# Patient Record
Sex: Male | Born: 1960 | Race: White | Hispanic: No | Marital: Single | Smoking: Never smoker
Health system: Southern US, Community
[De-identification: ages and names within clinical notes are randomized; demographics above are authoritative.]

## PROBLEM LIST (undated history)

## (undated) DIAGNOSIS — R1310 Dysphagia, unspecified: Secondary | ICD-10-CM

## (undated) DIAGNOSIS — G9340 Encephalopathy, unspecified: Secondary | ICD-10-CM

## (undated) DIAGNOSIS — E119 Type 2 diabetes mellitus without complications: Secondary | ICD-10-CM

## (undated) DIAGNOSIS — R569 Unspecified convulsions: Secondary | ICD-10-CM

## (undated) DIAGNOSIS — F329 Major depressive disorder, single episode, unspecified: Secondary | ICD-10-CM

## (undated) DIAGNOSIS — F419 Anxiety disorder, unspecified: Secondary | ICD-10-CM

---

## 2015-07-10 ENCOUNTER — Emergency Department
Admission: EM | Admit: 2015-07-10 | Discharge: 2015-07-10 | Disposition: A | Discharge status: Home / Self Care | Payer: Medicare Other | Attending: Emergency Medicine | Admitting: Emergency Medicine

## 2015-07-10 ENCOUNTER — Emergency Department: Payer: Medicare Other

## 2015-07-10 DIAGNOSIS — F329 Major depressive disorder, single episode, unspecified: Secondary | ICD-10-CM | POA: Diagnosis not present

## 2015-07-10 DIAGNOSIS — E119 Type 2 diabetes mellitus without complications: Secondary | ICD-10-CM | POA: Insufficient documentation

## 2015-07-10 DIAGNOSIS — R569 Unspecified convulsions: Secondary | ICD-10-CM

## 2015-07-10 DIAGNOSIS — R4182 Altered mental status, unspecified: Secondary | ICD-10-CM | POA: Diagnosis not present

## 2015-07-10 HISTORY — DX: Major depressive disorder, single episode, unspecified: F32.9

## 2015-07-10 HISTORY — DX: Anxiety disorder, unspecified: F41.9

## 2015-07-10 HISTORY — DX: Encephalopathy, unspecified: G93.40

## 2015-07-10 HISTORY — DX: Unspecified convulsions: R56.9

## 2015-07-10 HISTORY — DX: Type 2 diabetes mellitus without complications: E11.9

## 2015-07-10 LAB — CBC WITH DIFFERENTIAL/PLATELET
BASOS ABS: 0.1 10*3/uL (ref 0–0.1)
BASOS PCT: 1 %
EOS ABS: 0.2 10*3/uL (ref 0–0.7)
EOS PCT: 4 %
HCT: 39.2 % — ABNORMAL LOW (ref 40.0–52.0)
HEMOGLOBIN: 12.9 g/dL — AB (ref 13.0–18.0)
LYMPHS ABS: 1.4 10*3/uL (ref 1.0–3.6)
Lymphocytes Relative: 21 %
MCH: 26.5 pg (ref 26.0–34.0)
MCHC: 32.8 g/dL (ref 32.0–36.0)
MCV: 80.7 fL (ref 80.0–100.0)
Monocytes Absolute: 0.5 10*3/uL (ref 0.2–1.0)
Monocytes Relative: 7 %
NEUTROS PCT: 67 %
Neutro Abs: 4.7 10*3/uL (ref 1.4–6.5)
PLATELETS: 248 10*3/uL (ref 150–440)
RBC: 4.85 MIL/uL (ref 4.40–5.90)
RDW: 14.1 % (ref 11.5–14.5)
WBC: 6.9 10*3/uL (ref 3.8–10.6)

## 2015-07-10 LAB — COMPREHENSIVE METABOLIC PANEL
ALBUMIN: 3.9 g/dL (ref 3.5–5.0)
ALT: 23 U/L (ref 17–63)
AST: 27 U/L (ref 15–41)
Alkaline Phosphatase: 141 U/L — ABNORMAL HIGH (ref 38–126)
Anion gap: 11 (ref 5–15)
BILIRUBIN TOTAL: 0.4 mg/dL (ref 0.3–1.2)
BUN: 11 mg/dL (ref 6–20)
CHLORIDE: 101 mmol/L (ref 101–111)
CO2: 26 mmol/L (ref 22–32)
CREATININE: 0.63 mg/dL (ref 0.61–1.24)
Calcium: 9.2 mg/dL (ref 8.9–10.3)
GFR calc Af Amer: >60 mL/min (ref >60)
GFR calc non Af Amer: >60 mL/min (ref >60)
GLUCOSE: 209 mg/dL — AB (ref 65–99)
POTASSIUM: 4.6 mmol/L (ref 3.5–5.1)
Sodium: 138 mmol/L (ref 135–145)
Total Protein: 8.1 g/dL (ref 6.5–8.1)

## 2015-07-10 LAB — LIPASE, BLOOD: LIPASE: 24 U/L (ref 11–51)

## 2015-07-10 LAB — URINALYSIS COMPLETE WITH MICROSCOPIC (ARMC ONLY)
BILIRUBIN URINE: NEGATIVE
Bacteria, UA: NONE SEEN
GLUCOSE, UA: 50 mg/dL — AB
KETONES UR: NEGATIVE mg/dL
LEUKOCYTES UA: NEGATIVE
NITRITE: NEGATIVE
PH: 5 (ref 5.0–8.0)
Protein, ur: 100 mg/dL — AB
RBC / HPF: NONE SEEN RBC/hpf (ref 0–5)
Specific Gravity, Urine: 1.021 (ref 1.005–1.030)

## 2015-07-10 LAB — TROPONIN I

## 2015-07-10 LAB — PHENYTOIN LEVEL, TOTAL: Phenytoin Lvl: 4.6 ug/mL — ABNORMAL LOW (ref 10.0–20.0)

## 2015-07-10 LAB — LACTIC ACID, PLASMA
LACTIC ACID, VENOUS: 4.4 mmol/L — AB (ref 0.5–2.0)
LACTIC ACID, VENOUS: 1.5 mmol/L (ref 0.5–2.0)

## 2015-07-10 MED ORDER — SODIUM CHLORIDE 0.9 % IV SOLN
500.0000 mg | Freq: Once | INTRAVENOUS | Status: AC
  Administered 2015-07-10: 500 mg via INTRAVENOUS
  Filled 2015-07-10: qty 5

## 2015-07-10 MED ORDER — LEVETIRACETAM 100 MG/ML PO SOLN: 500.0000 mg | Freq: Two times a day (BID) | ORAL | Status: DC

## 2015-07-10 MED ORDER — SODIUM CHLORIDE 0.9 % IV BOLUS (SEPSIS)
1000.0000 mL | Freq: Once | INTRAVENOUS | Status: AC
  Administered 2015-07-10: 1000 mL via INTRAVENOUS

## 2015-07-10 MED ORDER — PHENYTOIN 50 MG PO CHEW: 300.0000 mg | CHEWABLE_TABLET | Freq: Every day | ORAL | Status: AC

## 2015-07-10 MED ORDER — SODIUM CHLORIDE 0.9 % IV SOLN: Freq: Once | INTRAVENOUS | Status: DC

## 2015-07-10 MED ORDER — SODIUM CHLORIDE 0.9 % IV SOLN
15.0000 mg/kg | Freq: Once | INTRAVENOUS | Status: AC
  Administered 2015-07-10: 1332 mg via INTRAVENOUS
  Filled 2015-07-10: qty 26.64

## 2015-07-10 NOTE — ED Notes (Signed)
Pt came to ED via EMS from [facility]. Pt had unwitnessed fall. Per staff, pt normally only responds one word. Pt has history of brain tumor, DM,  Wheelchair bound.

## 2015-07-10 NOTE — ED Notes (Signed)
Lactic acid 4.4 MD notified.

## 2015-07-10 NOTE — ED Notes (Signed)
Pt transported to CT ?

## 2015-07-10 NOTE — ED Notes (Addendum)
MD informed of new lactic acid result.  Verbal orders to discontinue STAT lactic acid and 2L NS.

## 2015-07-10 NOTE — Discharge Instructions (Signed)
The patient has a history of seizures and we think he may have had a seizure. His mental status has improved markedly since he's been here. His labs are essentially normal. CT shows no apparent acute pathology although his sinuses are obscured. He is not running a fever or white count. Please monitor the patient closely tonight and tomorrow. Please have him return here if he is worse at all. This includes running a fever becoming more confused again or having other any other problems. These have his doctor check on him quickly as well.      We asked to increase his Dilantin to 300 mg every evening, and his Keppra to 500 mg twice a day. I'll closely with his doctor patient will need a Dilantin level rechecked this week.

## 2015-07-10 NOTE — ED Notes (Signed)
Pt discharged by EMS to [facility].  Report given to [place] at facility.

## 2015-07-10 NOTE — ED Provider Notes (Signed)
[facility] Emergency Department Provider Note   ____________________________________________  Time seen: Approximately 12:25 PM  I have reviewed the triage vital signs and the nursing notes.   HISTORY  Chief Complaint Fall  History limited by decreased mental status  HPI Cory Salazar is a 55 y.o. male EMS brings patient from [facility]. He was apparently found face down and EMS was told that he is at his baseline mental status and thought to have fallen forward out of his wheelchair. Patient has a DO NOT RESUSCITATE bracelet on his wrist. We call [place] [facility] and speak to his nurse who reports she is not at his baseline is usually more alert and awake and that is why he was sent here. Patient apparently has a history of brain tumor. Currently patient is awake or responding to questions with 1 or 2 word answer. He does not open his eyes completely. He does not follow commands very well at all. I am uncertain as to how long his been like this.   Past Medical History  Diagnosis Date  . Diabetes mellitus without complication (HCC)   . MDD (major depressive disorder) (HCC)   . Encephalopathy   . Anxiety disorder   . Seizure (HCC)     There are no active problems to display for this patient.   History reviewed. No pertinent past surgical history.  No current outpatient prescriptions on file.  Allergies Review of patient's allergies indicates no known allergies.  No family history on file.  Social History Social History  Substance Use Topics  . Smoking status: Unknown If Ever Smoked  . Smokeless tobacco: None  . Alcohol Use: None     Comment: unable to assess    Review of Systems Unobtainable  ____________________________________________   PHYSICAL EXAM:  VITAL SIGNS: ED Triage Vitals  Enc Vitals Group     BP --      Pulse --      Resp --      Temp --      Temp src --      SpO2 --      Weight --      Height --        Head Cir --      Peak Flow --      Pain Score --      Pain Loc --      Pain Edu? --      Excl. in GC? --     Constitutional: Awake responds to questions with one-word answers. His voice is low pitched and very quiet. He does not follow commands very well at all. Eyes: Conjunctivae are normal. PERRL. EOMI. Head: There is no abrasion on his forehead and dried blood around his nose. There is a couple superficial cuts on his the bridge of his nose. There is no nasal septal hematoma. There is no apparent bleeding in his mouth. Nose: No congestion/rhinnorhea. Mouth/Throat: Mucous membranes are moist.  Oropharynx non-erythematous. Neck: No stridor.  Neck does not appear to be tender but the patient appears somewhat groggy so I'm not completely sure. Cardiovascular: Normal rate, regular rhythm. Grossly normal heart sounds.  Good peripheral circulation. Respiratory: Normal respiratory effort.  No retractions. Lungs CTAB. No apparent chest pain. Gastrointestinal: Soft and nontender. No distention. No abdominal bruits. No CVA tenderness. Musculoskeletal: No lower extremity tenderness nor edema.  No joint effusions. Neurologic: Patient not moving any extremities very well but he moves them all equally.. Skin:  Skin is warm, dry and intact. No rash noted.   ____________________________________________   LABS (all labs ordered are listed, but only abnormal results are displayed)  Labs Reviewed  COMPREHENSIVE METABOLIC PANEL - Abnormal; Notable for the following:    Glucose, Bld 209 (*)    Alkaline Phosphatase 141 (*)    All other components within normal limits  LACTIC ACID, PLASMA - Abnormal; Notable for the following:    Lactic Acid, Venous 4.4 (*)    All other components within normal limits  CBC WITH DIFFERENTIAL/PLATELET - Abnormal; Notable for the following:    Hemoglobin 12.9 (*)    HCT 39.2 (*)    All other components within normal limits  URINALYSIS COMPLETEWITH MICROSCOPIC  ([facility] ONLY) - Abnormal; Notable for the following:    Color, Urine YELLOW (*)    APPearance CLEAR (*)    Glucose, UA 50 (*)    Hgb urine dipstick 1+ (*)    Protein, ur 100 (*)    Squamous Epithelial / LPF 0-5 (*)    All other components within normal limits  LIPASE, BLOOD  TROPONIN I  LACTIC ACID, PLASMA   ____________________________________________  EKG  EKG read and interpreted by me shows sinus tachycardia 102 normal axis poor baseline no apparent acute EKG changes. ____________________________________________  RADIOLOGY  Study Result     CLINICAL DATA: 55 year old diabetic male. Poor historian. Under witnessed fall. Seizures. Encephalopathy. Questionable history of brain tumor. Initial encounter.  EXAM: CT HEAD WITHOUT CONTRAST  CT MAXILLOFACIAL WITHOUT CONTRAST  CT CERVICAL SPINE WITHOUT CONTRAST  TECHNIQUE: Multidetector CT imaging of the head, cervical spine, and maxillofacial structures were performed using the standard protocol without intravenous contrast. Multiplanar CT image reconstructions of the cervical spine and maxillofacial structures were also generated.  COMPARISON: None.  FINDINGS: CT HEAD FINDINGS  No skull fracture or intracranial hemorrhage.  Prior left parietal craniotomy. Metallic structures beneath the craniotomy and in the resection site where encephalomalacia is noted without obvious mass.  Diffuse calcifications posterior frontal, parietal and occipital lobes in a gyriform distribution. Prominent calcifications basal ganglia and right thalamus. Focal calcifications midbrain and prominent calcifications dentate nucleus region. These calcifications may reflect result of treatment of tumor or secondary to underlying metabolic abnormality.  No definitive vascular malformation noted.  Correlation with patient's prior imaging/ pathology recommended. If this was for a tumor which is aggressive than  follow-up contrast-enhanced CT recommended.  Prominent global atrophy without hydrocephalus.  Complete opacification frontal sinuses, ethmoid sinus air cells, sphenoid sinuses and polypoid prominent opacification maxillary sinuses. This may reflect result of polyposis and superimposed sinus obstruction. Hyper dense material within the opacified sinuses which may indicate inspissated proteinaceous although fungal disease can have a similar appearance. No obvious intraorbital or intracranial extension.  CT MAXILLOFACIAL FINDINGS  No fracture noted.  Diffuse opacification sinuses and nasal vault may indicate changes of polyposis as described above.  Please note that portion of the sphenoid wall form by optic canal and carotid canal.  Dental disease.  CT CERVICAL SPINE FINDINGS  No cervical spine fracture, malalignment or abnormal prevertebral soft tissue swelling.  No lung apical lesion or neck mass identified.  Mild cervical spondylotic changes.  IMPRESSION: CT HEAD  No skull fracture or intracranial hemorrhage.  Prior left parietal craniotomy. With left parietal lobe encephalomalacia containing small metallic structures.  Diffuse dystrophic calcifications may reflect result of treatment of tumor or secondary to underlying metabolic abnormality.  Correlation with patient's prior imaging/ pathology recommended. If this was for a  tumor which is aggressive than follow-up contrast-enhanced CT recommended.  Prominent global atrophy without hydrocephalus.  CT MAXILLOFACIAL  No fracture noted.  Complete opacification frontal sinuses, ethmoid sinus air cells, sphenoid sinuses and polypoid prominent opacification maxillary sinuses. This may reflect result of polyposis and superimposed sinus obstruction. Hyper dense material within the opacified sinuses which may indicate inspissated proteinaceous although fungal disease can have a similar  appearance. No obvious intraorbital or intracranial extension.  Please note that portion of the sphenoid wall form by optic canal and carotid canal.  Dental disease.  CT CERVICAL SPINE  No cervical spine fracture, malalignment or abnormal prevertebral soft tissue swelling.   Electronically Signed  By: Lacy Duverney M.D.  On: 07/10/2015 14:20    ____________________________________________   PROCEDURES   ____________________________________________   INITIAL IMPRESSION / ASSESSMENT AND PLAN / ED COURSE  Pertinent labs & imaging results that were available during my care of the patient were reviewed by me and considered in my medical decision making (see chart for details).  Discussed with [facility] again. He apparently was transferred up here from [place]. His only been at [facility] for a few days. The person who is his regular has been his regular care giver there since he got here and is no longer on duty. The people at [facility] now cannot tell me anything more than he will answer questions in one or 2 word sentences which makes sense. He has had brain cancer or brain tumor. That's about it. Patient is now answering questions and several word sentences but they frequently do not make sense. I cannot find out if his altered mental status is new or his baseline. The only pathology had been able to discover is the opacification of all of his sinuses. He does not evidence any pain on palpation of his entire body. His belly is soft and nontender with good bowel sounds lungs are clear ribs are nontender etc.  ----------------------------------------- 4:07 PM on 07/10/2015 -----------------------------------------  Patient's repeat lactic acid is lower 1.5 all rest of his evaluation is negative. It may be that he had a seizure and was postictal. He has a history of seizures and this would fit all the things he presented with on his  arrival here in the emergency room. Patient is now speaking and will follow some commands fairly well. He is making much more sense although he is calling one of the male nurses "sir." ____________________________________________   FINAL CLINICAL IMPRESSION(S) / ED DIAGNOSES  Final diagnoses:  Altered mental status, unspecified altered mental status type   Diagnosis is actually altered mental status, resolved   NEW MEDICATIONS STARTED DURING THIS VISIT:  New Prescriptions   No medications on file     Note:  This document was prepared using Dragon voice recognition software and may include unintentional dictation errors.    Arnaldo Natal, MD 07/10/15 [phone]

## 2015-07-10 NOTE — ED Notes (Addendum)
Spoke with staff at [facility] who state that the primary caregiver for patient is not there at this time; Staff cannot give clear image as to what patient baseline mentality is.   States that pt just moved to facility this past Thursday, unfamiliar with patient. States that pt does not hold long conversation at baseline but does use appropriate one word answers.   They report hx of brain tumor, TBI, encephalopathy psychosis.   States that they found patient on floor today after assumed fall, state patient was "knocked out", non reactive pupils.   Pt answers when entering room, intermittently appropriate answers. Pupils appropriately reactive. Pt unable to follow commands in order to perform NIH exam. Pt offers no resistance against gravity in any extremities. Pt able to cough, protect airway and maintain saliva.   MD informed.

## 2015-07-10 NOTE — ED Notes (Signed)
Report to Iris, RN  

## 2015-07-10 NOTE — ED Notes (Signed)
Pt changed from soiled diaper and fed applesauce and water.

## 2015-07-10 NOTE — ED Notes (Signed)
Pt alert and oriented to self. MD at bedside.

## 2015-07-10 NOTE — ED Provider Notes (Addendum)
-----------------------------------------   5:32 PM on 07/10/2015 -----------------------------------------  And out to me by Dr. Juliette AlcideMelinda. Patient here after falling out of his wheelchair. Initially, he had an elevated lactate acid level but that is rapidly corrected. This is all consistent with a seizure. Dr. mentation with the patient suggested at baseline he has severe dementia and he is oriented to name and sometimes place which is where he is at this point. He doesn't appear to be at his baseline. Vital signs are reassuring. We're waiting for a Dilantin level, the other levels will not come back in real time apparently. Patient has no complaints at this time. We are waiting Dilantin level and then we will likely be able to discharge the patient appears. This was Dr. Vickie EpleyMelindas plan.   ----------------------------------------- 6:48 PM on 07/10/2015 -----------------------------------------  Pt dilantin is subtheraputic. We are going to load him with dilantin and keppra.  He is in nad.  I am calling [facility] to see if they are giving his dilantin. D/w Boneta LucksJenny there, patient is receiving a total of 200 g daily of Dilantin and 3 times daily  250 mg of Keppra which I suspect is insufficient. We'll discuss with neurology and I think hopefully we can change his medications.  ----------------------------------------- 7:03 PM on 07/10/2015 ----------------------------------------- patient appears to have had a breakthrough seizure he is now back to his baseline according to all the notes like and 5 which is oriented to self, his dosing on his medications as apparently insufficient. I did discuss with Dr. Loretha BrasilZeylikman, who agrees and he did ask me to go up to 300 daily at bedtime of Dilantin which we'll do and 500 twice a day of Keppra. We will start with this and I will recommend that the patient have an outpatient Dilantin level in 3 or 4 days.    Jeanmarie PlantJames A McShane, MD 07/10/15 1733  Jeanmarie PlantJames A McShane,  MD 07/10/15 1850  Jeanmarie PlantJames A McShane, MD 07/10/15 [phone]

## 2015-07-10 NOTE — ED Notes (Signed)
Report called to Boneta LucksJenny, LPN at [facility].  Pt to transfer back to [facility] via [facility]

## 2015-07-12 LAB — PHENYTOIN LEVEL, FREE AND TOTAL
Phenytoin, Free: NOT DETECTED ug/mL (ref 1.0–2.0)
Phenytoin, Total: 4 ug/mL — ABNORMAL LOW (ref 10.0–20.0)

## 2015-07-12 LAB — LAMOTRIGINE LEVEL: LAMOTRIGINE LVL: 1.5 ug/mL — AB (ref 2.0–20.0)

## 2015-07-12 LAB — LEVETIRACETAM LEVEL: Levetiracetam Lvl: 11.5 ug/mL (ref 10.0–40.0)

## 2015-09-04 ENCOUNTER — Emergency Department: Payer: Medicare Other

## 2015-09-04 ENCOUNTER — Encounter: Payer: Self-pay | Admitting: Emergency Medicine

## 2015-09-04 ENCOUNTER — Inpatient Hospital Stay
Admission: EM | Admit: 2015-09-04 | Discharge: 2015-09-05 | DRG: 871 | Disposition: A | Discharge status: Other Acute Inpatient Hospital | Payer: Medicare Other | Attending: Internal Medicine | Admitting: Internal Medicine

## 2015-09-04 ENCOUNTER — Inpatient Hospital Stay: Payer: Medicare Other

## 2015-09-04 DIAGNOSIS — J939 Pneumothorax, unspecified: Secondary | ICD-10-CM

## 2015-09-04 DIAGNOSIS — Z79899 Other long term (current) drug therapy: Secondary | ICD-10-CM

## 2015-09-04 DIAGNOSIS — N1330 Unspecified hydronephrosis: Secondary | ICD-10-CM | POA: Diagnosis present

## 2015-09-04 DIAGNOSIS — Z794 Long term (current) use of insulin: Secondary | ICD-10-CM | POA: Diagnosis not present

## 2015-09-04 DIAGNOSIS — R000 Tachycardia, unspecified: Secondary | ICD-10-CM | POA: Diagnosis present

## 2015-09-04 DIAGNOSIS — E8809 Other disorders of plasma-protein metabolism, not elsewhere classified: Secondary | ICD-10-CM | POA: Diagnosis present

## 2015-09-04 DIAGNOSIS — F0390 Unspecified dementia without behavioral disturbance: Secondary | ICD-10-CM | POA: Diagnosis present

## 2015-09-04 DIAGNOSIS — N170 Acute kidney failure with tubular necrosis: Secondary | ICD-10-CM | POA: Diagnosis present

## 2015-09-04 DIAGNOSIS — N179 Acute kidney failure, unspecified: Secondary | ICD-10-CM

## 2015-09-04 DIAGNOSIS — E860 Dehydration: Secondary | ICD-10-CM | POA: Diagnosis present

## 2015-09-04 DIAGNOSIS — G40909 Epilepsy, unspecified, not intractable, without status epilepticus: Secondary | ICD-10-CM

## 2015-09-04 DIAGNOSIS — E1165 Type 2 diabetes mellitus with hyperglycemia: Secondary | ICD-10-CM | POA: Diagnosis present

## 2015-09-04 DIAGNOSIS — Z452 Encounter for adjustment and management of vascular access device: Secondary | ICD-10-CM

## 2015-09-04 DIAGNOSIS — E872 Acidosis: Secondary | ICD-10-CM | POA: Diagnosis present

## 2015-09-04 DIAGNOSIS — E870 Hyperosmolality and hypernatremia: Secondary | ICD-10-CM | POA: Diagnosis present

## 2015-09-04 DIAGNOSIS — L8990 Pressure ulcer of unspecified site, unspecified stage: Secondary | ICD-10-CM | POA: Diagnosis present

## 2015-09-04 DIAGNOSIS — Y95 Nosocomial condition: Secondary | ICD-10-CM | POA: Diagnosis present

## 2015-09-04 DIAGNOSIS — R6510 Systemic inflammatory response syndrome (SIRS) of non-infectious origin without acute organ dysfunction: Secondary | ICD-10-CM | POA: Diagnosis not present

## 2015-09-04 DIAGNOSIS — N390 Urinary tract infection, site not specified: Secondary | ICD-10-CM | POA: Diagnosis present

## 2015-09-04 DIAGNOSIS — A419 Sepsis, unspecified organism: Secondary | ICD-10-CM

## 2015-09-04 DIAGNOSIS — D696 Thrombocytopenia, unspecified: Secondary | ICD-10-CM | POA: Diagnosis present

## 2015-09-04 DIAGNOSIS — J189 Pneumonia, unspecified organism: Secondary | ICD-10-CM | POA: Diagnosis present

## 2015-09-04 DIAGNOSIS — Z66 Do not resuscitate: Secondary | ICD-10-CM | POA: Diagnosis present

## 2015-09-04 DIAGNOSIS — J690 Pneumonitis due to inhalation of food and vomit: Secondary | ICD-10-CM

## 2015-09-04 LAB — BASIC METABOLIC PANEL
Anion gap: 14 (ref 5–15)
BUN: 108 mg/dL — AB (ref 6–20)
CALCIUM: 8.4 mg/dL — AB (ref 8.9–10.3)
CO2: 15 mmol/L — AB (ref 22–32)
CREATININE: 8.66 mg/dL — AB (ref 0.61–1.24)
Chloride: 119 mmol/L — ABNORMAL HIGH (ref 101–111)
GFR calc Af Amer: 7 mL/min — ABNORMAL LOW (ref >60)
GFR calc non Af Amer: 6 mL/min — ABNORMAL LOW (ref >60)
GLUCOSE: 280 mg/dL — AB (ref 65–99)
Potassium: 4.1 mmol/L (ref 3.5–5.1)
Sodium: 148 mmol/L — ABNORMAL HIGH (ref 135–145)

## 2015-09-04 LAB — LACTIC ACID, PLASMA
Lactic Acid, Venous: 1.9 mmol/L (ref 0.5–1.9)
Lactic Acid, Venous: 2.9 mmol/L (ref 0.5–1.9)

## 2015-09-04 LAB — COMPREHENSIVE METABOLIC PANEL
ALBUMIN: 2.8 g/dL — AB (ref 3.5–5.0)
ALT: 20 U/L (ref 17–63)
AST: 30 U/L (ref 15–41)
Alkaline Phosphatase: 175 U/L — ABNORMAL HIGH (ref 38–126)
Anion gap: 16 — ABNORMAL HIGH (ref 5–15)
BUN: 122 mg/dL — AB (ref 6–20)
CHLORIDE: 112 mmol/L — AB (ref 101–111)
CO2: 18 mmol/L — AB (ref 22–32)
CREATININE: 9.15 mg/dL — AB (ref 0.61–1.24)
Calcium: 9.4 mg/dL (ref 8.9–10.3)
GFR calc Af Amer: 7 mL/min — ABNORMAL LOW (ref >60)
GFR calc non Af Amer: 6 mL/min — ABNORMAL LOW (ref >60)
Glucose, Bld: 344 mg/dL — ABNORMAL HIGH (ref 65–99)
Potassium: 4.7 mmol/L (ref 3.5–5.1)
SODIUM: 146 mmol/L — AB (ref 135–145)
Total Bilirubin: 3.1 mg/dL — ABNORMAL HIGH (ref 0.3–1.2)
Total Protein: 8 g/dL (ref 6.5–8.1)

## 2015-09-04 LAB — CBC WITH DIFFERENTIAL/PLATELET
BASOS ABS: 0 10*3/uL (ref 0–0.1)
Basophils Relative: 0 %
EOS PCT: 0 %
Eosinophils Absolute: 0 10*3/uL (ref 0–0.7)
HCT: 38.5 % — ABNORMAL LOW (ref 40.0–52.0)
Hemoglobin: 12.5 g/dL — ABNORMAL LOW (ref 13.0–18.0)
LYMPHS PCT: 4 %
Lymphs Abs: 0.7 10*3/uL — ABNORMAL LOW (ref 1.0–3.6)
MCH: 25.4 pg — ABNORMAL LOW (ref 26.0–34.0)
MCHC: 32.5 g/dL (ref 32.0–36.0)
MCV: 78.1 fL — AB (ref 80.0–100.0)
Monocytes Absolute: 0.9 10*3/uL (ref 0.2–1.0)
Monocytes Relative: 5 %
NEUTROS ABS: 15.7 10*3/uL — AB (ref 1.4–6.5)
Neutrophils Relative %: 91 %
PLATELETS: 147 10*3/uL — AB (ref 150–440)
RBC: 4.94 MIL/uL (ref 4.40–5.90)
RDW: 16.5 % — ABNORMAL HIGH (ref 11.5–14.5)
WBC: 17.3 10*3/uL — ABNORMAL HIGH (ref 3.8–10.6)

## 2015-09-04 LAB — BLOOD GAS, ARTERIAL
ALLENS TEST (PASS/FAIL): POSITIVE — AB
Acid-base deficit: 8.3 mmol/L — ABNORMAL HIGH (ref 0.0–2.0)
BICARBONATE: 17.2 meq/L — AB (ref 21.0–28.0)
FIO2: 0.4
O2 Saturation: 89.9 %
PH ART: 7.3 — AB (ref 7.350–7.450)
Patient temperature: 37
pCO2 arterial: 35 mmHg (ref 32.0–48.0)
pO2, Arterial: 65 mmHg — ABNORMAL LOW (ref 83.0–108.0)

## 2015-09-04 LAB — URINALYSIS COMPLETE WITH MICROSCOPIC (ARMC ONLY)
BILIRUBIN URINE: NEGATIVE
GLUCOSE, UA: 150 mg/dL — AB
KETONES UR: NEGATIVE mg/dL
NITRITE: NEGATIVE
Protein, ur: 100 mg/dL — AB
SPECIFIC GRAVITY, URINE: 1.014 (ref 1.005–1.030)
pH: 5 (ref 5.0–8.0)

## 2015-09-04 LAB — GLUCOSE, CAPILLARY
GLUCOSE-CAPILLARY: 240 mg/dL — AB (ref 65–99)
Glucose-Capillary: 261 mg/dL — ABNORMAL HIGH (ref 65–99)
Glucose-Capillary: 269 mg/dL — ABNORMAL HIGH (ref 65–99)

## 2015-09-04 LAB — MRSA PCR SCREENING: MRSA by PCR: NEGATIVE

## 2015-09-04 LAB — PHENYTOIN LEVEL, TOTAL: PHENYTOIN LVL: 8.2 ug/mL — AB (ref 10.0–20.0)

## 2015-09-04 MED ORDER — ONDANSETRON HCL 4 MG/2ML IJ SOLN: 4.0000 mg | Freq: Four times a day (QID) | INTRAMUSCULAR | Status: DC | PRN

## 2015-09-04 MED ORDER — MIRTAZAPINE 15 MG PO TABS: 15.0000 mg | ORAL_TABLET | Freq: Every day | ORAL | Status: DC

## 2015-09-04 MED ORDER — SERTRALINE HCL 50 MG PO TABS: 50.0000 mg | ORAL_TABLET | Freq: Every day | ORAL | Status: DC

## 2015-09-04 MED ORDER — ACETAMINOPHEN 325 MG RE SUPP
RECTAL | Status: AC
  Filled 2015-09-04: qty 3

## 2015-09-04 MED ORDER — SODIUM CHLORIDE 0.45 % IV SOLN
INTRAVENOUS | Status: DC
  Administered 2015-09-04 – 2015-09-05 (×3): via INTRAVENOUS

## 2015-09-04 MED ORDER — PIPERACILLIN-TAZOBACTAM 3.375 G IVPB
3.3750 g | Freq: Two times a day (BID) | INTRAVENOUS | Status: DC
  Administered 2015-09-04: 3.375 g via INTRAVENOUS
  Filled 2015-09-04 (×3): qty 50

## 2015-09-04 MED ORDER — BISACODYL 10 MG RE SUPP: 10.0000 mg | Freq: Every day | RECTAL | Status: DC | PRN

## 2015-09-04 MED ORDER — CETYLPYRIDINIUM CHLORIDE 0.05 % MT LIQD
7.0000 mL | Freq: Two times a day (BID) | OROMUCOSAL | Status: DC
  Administered 2015-09-05: 7 mL via OROMUCOSAL

## 2015-09-04 MED ORDER — SODIUM CHLORIDE 0.9 % IV BOLUS (SEPSIS)
1000.0000 mL | Freq: Once | INTRAVENOUS | Status: AC
  Administered 2015-09-04: 1000 mL via INTRAVENOUS

## 2015-09-04 MED ORDER — ACETAMINOPHEN 650 MG RE SUPP
975.0000 mg | Freq: Once | RECTAL | Status: AC
  Administered 2015-09-04: 975 mg via RECTAL

## 2015-09-04 MED ORDER — SODIUM CHLORIDE 0.9 % IV SOLN: 7.0000 mg/kg | Freq: Once | INTRAVENOUS | Status: DC

## 2015-09-04 MED ORDER — FAMOTIDINE IN NACL 20-0.9 MG/50ML-% IV SOLN
20.0000 mg | Freq: Every day | INTRAVENOUS | Status: DC
  Filled 2015-09-04: qty 50

## 2015-09-04 MED ORDER — ACETAMINOPHEN 650 MG RE SUPP
650.0000 mg | Freq: Four times a day (QID) | RECTAL | Status: DC | PRN
Start: 2015-09-04 — End: 2015-09-05

## 2015-09-04 MED ORDER — ACETAMINOPHEN 325 MG PO TABS
650.0000 mg | ORAL_TABLET | Freq: Four times a day (QID) | ORAL | Status: DC | PRN
Start: 2015-09-04 — End: 2015-09-05

## 2015-09-04 MED ORDER — PHENYTOIN SODIUM 50 MG/ML IJ SOLN: 100.0000 mg | Freq: Three times a day (TID) | INTRAMUSCULAR | Status: DC

## 2015-09-04 MED ORDER — HEPARIN SODIUM (PORCINE) 5000 UNIT/ML IJ SOLN
5000.0000 [IU] | Freq: Three times a day (TID) | INTRAMUSCULAR | Status: DC
  Administered 2015-09-04 – 2015-09-05 (×3): 5000 [IU] via SUBCUTANEOUS
  Filled 2015-09-04 (×3): qty 1

## 2015-09-04 MED ORDER — METHYLPREDNISOLONE SODIUM SUCC 125 MG IJ SOLR
60.0000 mg | Freq: Two times a day (BID) | INTRAMUSCULAR | Status: DC
  Administered 2015-09-04 – 2015-09-05 (×3): 60 mg via INTRAVENOUS
  Filled 2015-09-04 (×3): qty 2

## 2015-09-04 MED ORDER — ONDANSETRON HCL 4 MG PO TABS: 4.0000 mg | ORAL_TABLET | Freq: Four times a day (QID) | ORAL | Status: DC | PRN

## 2015-09-04 MED ORDER — INSULIN ASPART 100 UNIT/ML ~~LOC~~ SOLN
0.0000 [IU] | SUBCUTANEOUS | Status: DC
  Administered 2015-09-04: 3 [IU] via SUBCUTANEOUS
  Administered 2015-09-04 (×2): 5 [IU] via SUBCUTANEOUS
  Administered 2015-09-05 (×3): 3 [IU] via SUBCUTANEOUS
  Filled 2015-09-04: qty 5
  Filled 2015-09-04 (×4): qty 3
  Filled 2015-09-04: qty 5

## 2015-09-04 MED ORDER — LAMOTRIGINE 100 MG PO TABS: 100.0000 mg | ORAL_TABLET | Freq: Two times a day (BID) | ORAL | Status: DC

## 2015-09-04 MED ORDER — INSULIN GLARGINE 100 UNIT/ML ~~LOC~~ SOLN
20.0000 [IU] | Freq: Every day | SUBCUTANEOUS | Status: DC
  Administered 2015-09-04: 20 [IU] via SUBCUTANEOUS
  Filled 2015-09-04 (×2): qty 0.2

## 2015-09-04 MED ORDER — TAMSULOSIN HCL 0.4 MG PO CAPS: 0.4000 mg | ORAL_CAPSULE | Freq: Every day | ORAL | Status: DC

## 2015-09-04 MED ORDER — VANCOMYCIN HCL IN DEXTROSE 1-5 GM/200ML-% IV SOLN
1000.0000 mg | Freq: Once | INTRAVENOUS | Status: AC
  Administered 2015-09-04: 1000 mg via INTRAVENOUS
  Filled 2015-09-04: qty 200

## 2015-09-04 MED ORDER — MORPHINE SULFATE (PF) 2 MG/ML IV SOLN: 2.0000 mg | INTRAVENOUS | Status: DC | PRN

## 2015-09-04 MED ORDER — SODIUM CHLORIDE 0.9 % IV SOLN
500.0000 mg | Freq: Two times a day (BID) | INTRAVENOUS | Status: DC
  Administered 2015-09-04 – 2015-09-05 (×2): 500 mg via INTRAVENOUS
  Filled 2015-09-04 (×3): qty 5

## 2015-09-04 MED ORDER — LACTULOSE 10 GM/15ML PO SOLN: 20.0000 g | Freq: Two times a day (BID) | ORAL | Status: DC

## 2015-09-04 MED ORDER — DOCUSATE SODIUM 100 MG PO CAPS: 100.0000 mg | ORAL_CAPSULE | Freq: Two times a day (BID) | ORAL | Status: DC

## 2015-09-04 MED ORDER — PIPERACILLIN-TAZOBACTAM 3.375 G IVPB 30 MIN
3.3750 g | Freq: Once | INTRAVENOUS | Status: AC
  Administered 2015-09-04: 3.375 g via INTRAVENOUS
  Filled 2015-09-04: qty 50

## 2015-09-04 MED ORDER — SODIUM CHLORIDE 0.9% FLUSH
3.0000 mL | Freq: Two times a day (BID) | INTRAVENOUS | Status: DC
  Administered 2015-09-04 – 2015-09-05 (×3): 3 mL via INTRAVENOUS

## 2015-09-04 MED ORDER — IPRATROPIUM-ALBUTEROL 0.5-2.5 (3) MG/3ML IN SOLN
3.0000 mL | Freq: Four times a day (QID) | RESPIRATORY_TRACT | Status: DC
  Administered 2015-09-04 – 2015-09-05 (×3): 3 mL via RESPIRATORY_TRACT
  Filled 2015-09-04 (×5): qty 3

## 2015-09-04 MED ORDER — CHLORHEXIDINE GLUCONATE 0.12 % MT SOLN
15.0000 mL | Freq: Two times a day (BID) | OROMUCOSAL | Status: DC
  Administered 2015-09-04 – 2015-09-05 (×2): 15 mL via OROMUCOSAL
  Filled 2015-09-04: qty 15

## 2015-09-04 MED ORDER — LORAZEPAM 2 MG/ML IJ SOLN
1.0000 mg | INTRAMUSCULAR | Status: DC | PRN
  Administered 2015-09-04: 1 mg via INTRAVENOUS
  Filled 2015-09-04: qty 1

## 2015-09-04 MED ORDER — SODIUM CHLORIDE 0.9 % IV SOLN
7.0000 mg/kg | Freq: Once | INTRAVENOUS | Status: AC
  Administered 2015-09-04: 460 mg via INTRAVENOUS
  Filled 2015-09-04: qty 9.2

## 2015-09-04 MED ORDER — LEVETIRACETAM 100 MG/ML PO SOLN
500.0000 mg | Freq: Two times a day (BID) | ORAL | Status: DC
  Administered 2015-09-04: 500 mg via ORAL
  Filled 2015-09-04 (×2): qty 5

## 2015-09-04 NOTE — ED Triage Notes (Signed)
Patient arrives to [facility] via [facility] as "Code Sepsis". Initial EMS call was encoded as "Unresponsive". EMS arrived to find patient unresponsive, tachycardic, febrile, and hyperglycemic. Staff stated that the "Patient became like this in the past 10-15 minutes".  EMS reported that staff attempted to have patient ingest cough syrup while unresponsive. Patient arrives with red thick liquid on and about his face a gown.   Advantageous breath sounds noted

## 2015-09-04 NOTE — H&P (Signed)
History and Physical    Cory Salazar DDU:[id number] DOB: May 23, 1960 DOA: 09/04/2015  Referring physician: Dr. Alphonzo Lemmings PCP: No primary care provider on file.  Specialists: none  Chief Complaint: lethargy  HPI: Cory Salazar is a 55 y.o. 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 In ER, pt was tachycardic and febrile. WBC=17k. CXR shows right-sided pneumonia. He is now admitted. Pt is unable to provide history due to mental status. Aslo noted in ER to be in ARF with poor urine output.  Review of Systems: unable to obtain due to mental status and lethargy  Past Medical History:  Diagnosis Date  . Anxiety disorder   . Diabetes mellitus without complication (HCC)   . Encephalopathy   . MDD (major depressive disorder) (HCC)   . Seizure [facility])    History reviewed. No pertinent surgical history. Social History:  has an unknown smoking status. He has never used smokeless tobacco. His alcohol and drug histories are not on file.  No Known Allergies  History reviewed. No pertinent family history.  Prior to Admission medications   Medication Sig Start Date End Date Taking? Authorizing Provider  acetaminophen (TYLENOL) 325 MG tablet Take 650 mg by mouth every 4 (four) hours as needed for mild pain, moderate pain or fever.   Yes Historical Provider, MD  cefTRIAXone (ROCEPHIN) 1 g injection Inject 1 g into the muscle daily. For 7 days. 09/02/15 09/09/15 Yes Historical Provider, MD  docusate sodium (COLACE) 100 MG capsule Take 100 mg by mouth 2 (two) times daily.   Yes Historical Provider, MD  insulin aspart (NOVOLOG PENFILL) cartridge Inject 7 Units into the skin 3 (three) times daily with meals.   Yes Historical Provider, MD  insulin glargine (LANTUS) 100 unit/mL SOPN Inject 20 Units into the skin at bedtime.   Yes Historical Provider, MD  ipratropium-albuterol (DUONEB) 0.5-2.5 (3) MG/3ML SOLN Take 3 mLs  by nebulization every 6 (six) hours. X 3 days 09/02/15 09/05/15 Yes Historical Provider, MD  lactulose (CHRONULAC) 10 GM/15ML solution Take 20 g by mouth 2 (two) times daily.   Yes Historical Provider, MD  lamoTRIgine (LAMICTAL) 100 MG tablet Take 100 mg by mouth 2 (two) times daily.   Yes Historical Provider, MD  levETIRAcetam (KEPPRA) 100 MG/ML solution Take 5 mLs (500 mg total) by mouth 2 (two) times daily. 07/10/15  Yes Jeanmarie Plant, MD  mirtazapine (REMERON) 15 MG tablet Take 15 mg by mouth at bedtime. **Medication on hold from 09/01/15 to 09/04/15 per MD at nursing home**   Yes Historical Provider, MD  phenytoin (DILANTIN) 50 MG tablet Chew 6 tablets (300 mg total) by mouth at bedtime. 07/10/15 07/09/16 Yes Jeanmarie Plant, MD  potassium chloride SA (K-DUR,KLOR-CON) 20 MEQ tablet Take 20 mEq by mouth 2 (two) times daily.   Yes Historical Provider, MD  QUEtiapine (SEROQUEL) 200 MG tablet Take 200 mg by mouth 2 (two) times daily. **Medication on hold from 09/01/15 to 09/04/15 per MD at [facility]**   Yes Historical Provider, MD  sertraline (ZOLOFT) 50 MG tablet Take 50 mg by mouth daily.   Yes Historical Provider, MD  tamsulosin (FLOMAX) 0.4 MG CAPS capsule Take 0.4 mg by mouth daily.   Yes Historical Provider, MD   Physical Exam: Vitals:   09/04/15 1122 09/04/15 1130 09/04/15 1145 09/04/15 1200  BP:  111/80 111/76 116/79  Pulse: (!) 124 (!) 124 (!) 119 (!) 118  Resp: (!) 26 (!) 22 (!) 23 17  Temp:   99.5 F (37.5 C) 99.1 F (37.3 C)  TempSrc:      SpO2: 96% 95% 100% 100%  Weight:         General: lethargic in No apparent distress, thin, scarring of skull noted  Eyes: PERRL, EOMI, no scleral icterus, conjunctiva clear  ENT: dry oropharynx without lesions, dentition poor, TM's benign  Neck: supple, no lymphadenopathy. No bruits or thyromegaly  Cardiovascular: rapid rate with regular rhythm without MRG; 2+ peripheral pulses, no JVD, no peripheral edema  Respiratory: right  sided rhonchi noted without wheezes or rales. No dullness. Respiratory effort increased  Abdomen: soft, non tender to palpation, positive bowel sounds, no guarding, no rebound  Skin: no rashes or lesions  Musculoskeletal: normal bulk and tone, no joint swelling  Psychiatric: unable to assess due to mental status  Neurologic: CN 2-12 grossly intact, Motor strength 4/5 in all 4 groups with diffusely diminished DTR's but non-focal sensory exam  Labs on Admission:  Basic Metabolic Panel:  Recent Labs Lab 09/04/15 1102  NA 146*  K 4.7  CL 112*  CO2 18*  GLUCOSE 344*  BUN 122*  CREATININE 9.15*  CALCIUM 9.4   Liver Function Tests:  Recent Labs Lab 09/04/15 1102  AST 30  ALT 20  ALKPHOS 175*  BILITOT 3.1*  PROT 8.0  ALBUMIN 2.8*   No results for input(s): LIPASE, AMYLASE in the last 168 hours. No results for input(s): AMMONIA in the last 168 hours. CBC:  Recent Labs Lab 09/04/15 1102  WBC 17.3*  NEUTROABS 15.7*  HGB 12.5*  HCT 38.5*  MCV 78.1*  PLT 147*   Cardiac Enzymes: No results for input(s): CKTOTAL, CKMB, CKMBINDEX, TROPONINI in the last 168 hours.  BNP (last 3 results) No results for input(s): BNP in the last 8760 hours.  ProBNP (last 3 results) No results for input(s): PROBNP in the last 8760 hours.  CBG: No results for input(s): GLUCAP in the last 168 hours.  Radiological Exams on Admission: Dg Chest Port 1 View  Result Date: 09/04/2015 CLINICAL DATA:  Fever, probable sepsis, diabetic. Pt unable to give hx at this time EXAM: PORTABLE CHEST 1 VIEW COMPARISON:  07/10/2015 FINDINGS: Midline trachea. Normal heart size. Numerous leads and wires project over the chest. No pleural effusion or pneumothorax. Mild medial right lung base airspace disease is suspected. Clear left lung. IMPRESSION: Subtle medial right lung airspace disease. Although this could represent atelectasis, given the clinical history, suspicious for early infection. If possible, PA  and lateral radiographs should be considered. Electronically Signed   By: Jeronimo Greaves M.D.   On: 09/04/2015 11:30   EKG: Independently reviewed.  Assessment/Plan Principal Problem:   SIRS (systemic inflammatory response syndrome) (HCC) Active Problems:   HCAP (healthcare-associated pneumonia)   ARF (acute renal failure) (HCC)   Seizure disorder (HCC)   Will admit to stepdown as FULL CODE. Begin IV fluids and IV ABX with SVN's. Consult Neurology and Nephrology. Check renal US. Follow sugars. Repeat labs in AM. Prognosis poor  Diet: soft Fluids: 1/2 NS@125  DVT Prophylaxis: SQ heparin  Code Status: FULL  Family Communication: none  Disposition Plan: SNF  Time spent: 55 min

## 2015-09-04 NOTE — ED Notes (Signed)
Patient transported to Ultrasound 

## 2015-09-04 NOTE — Progress Notes (Signed)
Pt remains minimally responsive and unable to safely take PO medications. Temp now 96.9. MD Hugelmeyer on call updated on Pt's condition. MD switched what meds she could to IV. Verbal orders to apply warming blanket and to hold PO meds for tonight. Lactic Acid recheck ordered for four hours from now. Will continue to monitor Pt closely.

## 2015-09-04 NOTE — ED Provider Notes (Addendum)
[facility] Emergency Department Provider Note  ____________________________________________   I have reviewed the triage vital signs and the nursing notes.   HISTORY  Chief Complaint No chief complaint on file.    HPI Cory Salazar is a 55 y.o. male with a history of brain tumor, who wears a DO NOT RESUSCITATE bracelet but does not come with paperwork otherwise signifying DO NOT RESUSCITATE status, who is at baseline as of his last discharge oriented to self only with a history of dementia presents today with fever and hypoxia. Patient cannot give me further history. We are calling the nursing home for further information although we have already talked to them.Level 5 chart caveat; no further history available due to patient status.  *   Past Medical History:  Diagnosis Date  . Anxiety disorder   . Diabetes mellitus without complication (HCC)   . Encephalopathy   . MDD (major depressive disorder) (HCC)   . Seizure (HCC)     There are no active problems to display for this patient.   No past surgical history on file.  Current Outpatient Rx  . Order #: [id number] Class: Print  . Order #: [id number] Class: Print    Allergies Review of patient's allergies indicates no known allergies.  No family history on file.  Social History Social History  Substance Use Topics  . Smoking status: Unknown If Ever Smoked  . Smokeless tobacco: Not on file  . Alcohol use Not on file     Comment: unable to assess    Review of Systems Level 5 chart caveat; no further history available due to patient status. ____________________________________________   PHYSICAL EXAM:  VITAL SIGNS: ED Triage Vitals  Enc Vitals Group     BP      Pulse      Resp      Temp      Temp src      SpO2      Weight      Height      Head Circumference      Peak Flow      Pain Score      Pain Loc      Pain Edu?      Excl. in GC?     Constitutional: Eyes are open he is  somnolent but will say his name. Chronically ill appearing, also appears acutely unwell  Eyes: Conjunctivae are normal. PERRL. EOMI. Head: Atraumatic. Nose: No congestion/rhinnorhea. Mouth/Throat: Mucous membranes are dry.  Oropharynx non-erythematous. Neck: No stridor.   Nontender with no meningismus Cardiovascular: Acute cardia noted, regular rhythm. Grossly normal heart sounds.  Good peripheral circulation. Respiratory: Normal respiratory effort.  No retractions. She is rhonchi and occasional wet sounding cough noted Abdominal: Soft and nontender. No distention. No guarding no rebound Back:  There is no focal tenderness or step off.  there is no midline tenderness there are no lesions noted. there is no CVA tenderness Musculoskeletal: No lower extremity tenderness, no upper extremity tenderness. No joint effusions, no DVT signs strong distal pulses no edema Neurologic:  No obvious focal deficits noted Skin:  Skin is warm, dry and intact. No rash noted.   ____________________________________________   LABS (all labs ordered are listed, but only abnormal results are displayed)  Labs Reviewed  CULTURE, BLOOD (ROUTINE X 2)  CULTURE, BLOOD (ROUTINE X 2)  URINE CULTURE  COMPREHENSIVE METABOLIC PANEL  CBC WITH DIFFERENTIAL/PLATELET  BLOOD GAS, ARTERIAL  PHENYTOIN LEVEL, TOTAL  URINALYSIS COMPLETEWITH MICROSCOPIC ([facility] ONLY)  LACTIC ACID, PLASMA  LACTIC ACID, PLASMA   ____________________________________________  EKG  I personally interpreted any EKGs ordered by me or triage Sinus tach rate 133 no acute ST elevation or depression nonspecific ST changes normal axis ____________________________________________  RADIOLOGY  I reviewed any imaging ordered by me or triage that were performed during my shift and, if possible, patient and/or family made aware of any abnormal findings. ____________________________________________   PROCEDURES  Procedure(s) performed:  None  Procedures  Critical Care performed: CRITICAL CARE Performed by: Jeanmarie Plant   Total critical care time: 55  minutes  Critical care time was exclusive of separately billable procedures and treating other patients.  Critical care was necessary to treat or prevent imminent or life-threatening deterioration.  Critical care was time spent personally by me on the following activities: development of treatment plan with patient and/or surrogate as well as nursing, discussions with consultants, evaluation of patient's response to treatment, examination of patient, obtaining history from patient or surrogate, ordering and performing treatments and interventions, ordering and review of laboratory studies, ordering and review of radiographic studies, pulse oximetry and re-evaluation of patient's condition.   ____________________________________________   INITIAL IMPRESSION / ASSESSMENT AND PLAN / ED COURSE  Pertinent labs & imaging results that were available during my care of the patient were reviewed by me and considered in my medical decision making (see chart for details).  patient febrile, hypoxic with a wet sounding cough, most likely a pneumonia. Into notes were also trying to give him cough medication this morning and he was having difficulty taking it. Patient is getting sepsis protocol workup given tachycardia hypoxia and questionable altered mental status over his baseline. We will observe him closely here in the emergency department.  ----------------------------------------- 11:29 AM on 09/04/2015 -----------------------------------------  I discussed with the nurse at the facility where he stays. They do note that on 720 he had chest x-ray which showed a pneumonia, they state that he got 1 dose of Rocephin on 721, did not receive any further antibiotics after that even though apparently it was written for daily dosing. Therefore, patient was diagnosed with pneumonia 2 days  ago had one shot of antibiotics at that time. This is certainly consistent with his presentation here. They also state he has been gradually increasing lethargy over the course of the week.  ----------------------------------------- 12:32 PM on 09/04/2015 -----------------------------------------  Patient's creatinine reflects significant impact on his kidneys. Patient appears very dehydrated. His creatinine was normal. K is reassuring however. I have no evidence of obstruction. He does have a Foley in place, he had trace amounts of urine only.  This is likely reflective of severe dehydration.  He has had 3 liters from [place] thus far.   Clinical Course  Comment By Time  ----------------------------------------- 12:04 PM on @EDTODAY @ ----------------------------------------- Pt more alert.  After d/w pharmacy I am going to supplement his dilantin. Will be admitted.   Jeanmarie Plant, MD 07/23 1204   ____________________________________________   FINAL CLINICAL IMPRESSION(S) / ED DIAGNOSES  Final diagnoses:  None      This chart was dictated using voice recognition software.  Despite best efforts to proofread,  errors can occur which can change meaning.      Jeanmarie Plant, MD 09/04/15 1120    Jeanmarie Plant, MD 09/04/15 1130    Jeanmarie Plant, MD 09/04/15 1205    Jeanmarie Plant, MD 09/04/15 1235

## 2015-09-04 NOTE — ED Notes (Signed)
Pt shaking, breaking fever. HR 140s, MD Sparks notified, orders for ativan given, see MAR

## 2015-09-04 NOTE — NC FL2 (Signed)
[place] MEDICAID FL2 LEVEL OF CARE SCREENING TOOL     IDENTIFICATION  Patient Name: Cory Salazar Birthdate: May 01, 1960 Sex: male Admission Date (Current Location): 09/04/2015  [place] and [place] Number:  Chiropodist and Address:  [facility], [address], [place], [place] [address]      Provider Number: [id number]  Attending Physician Name and Address:  Marguarite Arbour, MD  Relative Name and Phone Number:       Current Level of Care: Hospital Recommended Level of Care: Skilled Nursing Facility Prior Approval Number:    Date Approved/Denied:   PASRR Number:  [id number]  Discharge Plan: SNF    Current Diagnoses: Patient Active Problem List   Diagnosis Date Noted  . HCAP (healthcare-associated pneumonia) 09/04/2015  . SIRS (systemic inflammatory response syndrome) (HCC) 09/04/2015  . ARF (acute renal failure) (HCC) 09/04/2015  . Seizure disorder (HCC) 09/04/2015    Orientation RESPIRATION BLADDER Height & Weight     Self, Time, Situation, Place    Incontinent Weight: 145 lb 4.8 oz (65.9 kg) Height:   (185.4 cm)  BEHAVIORAL SYMPTOMS/MOOD NEUROLOGICAL BOWEL NUTRITION STATUS      Incontinent    AMBULATORY STATUS COMMUNICATION OF NEEDS Skin   Limited Assist Verbally Normal                       Personal Care Assistance Level of Assistance  Bathing, Feeding, Dressing, Total care Bathing Assistance: Limited assistance Feeding assistance: Limited assistance Dressing Assistance: Limited assistance Total Care Assistance: Limited assistance   Functional Limitations Info  Sight, Hearing, Speech Sight Info: Adequate Hearing Info: Impaired Speech Info: Impaired    SPECIAL CARE FACTORS FREQUENCY   Diabetic- Insulin sliding scale Seizures                    Contractures      Additional Factors Info  Code Status Code Status Info: Full             Current Medications (09/04/2015):  This is the current  hospital active medication list Current Facility-Administered Medications  Medication Dose Route Frequency Provider Last Rate Last Dose  . 0.45 % sodium chloride infusion   Intravenous Continuous Marguarite Arbour, MD 125 mL/hr at 09/04/15 1253    . acetaminophen (TYLENOL) 325 MG suppository           . acetaminophen (TYLENOL) tablet 650 mg  650 mg Oral Q6H PRN Marguarite Arbour, MD       Or  . acetaminophen (TYLENOL) suppository 650 mg  650 mg Rectal Q6H PRN Marguarite Arbour, MD      . antiseptic oral rinse (CPC / CETYLPYRIDINIUM CHLORIDE 0.05%) solution 7 mL  7 mL Mouth Rinse q12n4p Marguarite Arbour, MD      . bisacodyl (DULCOLAX) suppository 10 mg  10 mg Rectal Daily PRN Marguarite Arbour, MD      . chlorhexidine (PERIDEX) 0.12 % solution 15 mL  15 mL Mouth Rinse BID Marguarite Arbour, MD      . docusate sodium (COLACE) capsule 100 mg  100 mg Oral BID Marguarite Arbour, MD      . Melene Muller ON 09/05/2015] famotidine (PEPCID) IVPB 20 mg premix  20 mg Intravenous Daily Marguarite Arbour, MD      . heparin injection 5,000 Units  5,000 Units Subcutaneous Q8H Marguarite Arbour, MD      . insulin aspart (novoLOG) injection 0-9 Units  0-9 Units Subcutaneous Q4H Marguarite Arbour, MD      . insulin glargine (LANTUS) injection 20 Units  20 Units Subcutaneous QHS Marguarite Arbour, MD      . ipratropium-albuterol (DUONEB) 0.5-2.5 (3) MG/3ML nebulizer solution 3 mL  3 mL Nebulization QID Marguarite Arbour, MD   3 mL at 09/04/15 1518  . lactulose (CHRONULAC) 10 GM/15ML solution 20 g  20 g Oral BID Marguarite Arbour, MD      . lamoTRIgine (LAMICTAL) tablet 100 mg  100 mg Oral BID Marguarite Arbour, MD      . levETIRAcetam (KEPPRA) 100 MG/ML solution 500 mg  500 mg Oral BID Marguarite Arbour, MD      . LORazepam (ATIVAN) injection 1 mg  1 mg Intravenous Q4H PRN Marguarite Arbour, MD   1 mg at 09/04/15 1301  . methylPREDNISolone sodium succinate (SOLU-MEDROL) 125 mg/2 mL injection 60 mg  60 mg Intravenous Q12H Marguarite Arbour, MD   60 mg at 09/04/15 1251  . [START ON 09/05/2015] mirtazapine (REMERON) tablet 15 mg  15 mg Oral QHS Marguarite Arbour, MD      . morphine 2 MG/ML injection 2 mg  2 mg Intravenous Q2H PRN Marguarite Arbour, MD      . ondansetron [facility]) tablet 4 mg  4 mg Oral Q6H PRN Marguarite Arbour, MD       Or  . ondansetron [facility]) injection 4 mg  4 mg Intravenous Q6H PRN Marguarite Arbour, MD      . piperacillin-tazobactam (ZOSYN) IVPB 3.375 g  3.375 g Intravenous Q12H Rolm Baptise, RPH      . sertraline (ZOLOFT) tablet 50 mg  50 mg Oral Daily Marguarite Arbour, MD      . sodium chloride flush (NS) 0.9 % injection 3 mL  3 mL Intravenous Q12H Marguarite Arbour, MD      . tamsulosin (FLOMAX) capsule 0.4 mg  0.4 mg Oral Daily Marguarite Arbour, MD         Discharge Medications: Please see discharge summary for a list of discharge medications.  Relevant Imaging Results:  Relevant Lab Results:   Additional Information SSN [id number]  Cheron Schaumann, [place]

## 2015-09-04 NOTE — ED Notes (Signed)
To early for tylenol for temp, MD Sparks notified of pt temp 101.7, cooling measures with ice bags applied to pt at this time.

## 2015-09-04 NOTE — ED Notes (Signed)
Per [employer] pt is not a DNR, no code is on file at the facililty,

## 2015-09-04 NOTE — Clinical Social Work Note (Addendum)
Clinical Social Work Assessment  Patient Details  Name: Cory Salazar MRN: [id number] Date of Birth: [birth year]  Date of referral:  09/04/15               Reason for consult:  Facility Placement                Permission sought to share information with:  Family Supports, Magazine features editor Permission granted to share information::  Yes, Verbal Permission Granted  Name::      [name]  [id number] Devron Housden [phone]  Agency::  [facility]  Relationship::  yes  Contact Information:   [name]  [id number] [place] [phone]  Housing/Transportation Living arrangements for the past 2 months:  Skilled Nursing Facility Source of Information:  Other (Comment Required) (Sibling) Patient Interpreter Needed:  None Criminal Activity/Legal Involvement Pertinent to Current Situation/Hospitalization:  No - Comment as needed Significant Relationships:  Siblings Lives with:  Facility Resident Do you feel safe going back to the place where you live?  Yes Need for family participation in patient care:  Yes (Comment)  Care giving concerns: Spoke to his sister and she reports family very concerned at the many times he comes in.   Social Worker assessment / plan:  LCSW was unable to assess patient as he was transitioning into the ICU unit. Called his sister Brodix Salvino [phone] Patient has several medical issues-Patient is a 55 y.o. male with medical problems of anxiety, depression, brain tumor surgery, h/o dementia, Diabetes, insulin dependent, h/o Seizures, resident of [facility] who was admitted to [facility] on 09/04/2015 for evaluation of fever, hypoxia. Patient was non responsive- unable to give history so LCSW called his sisters to collect information to complete this assessment. Called [facility] and was unable to get an answer). LCSW completed assessment and fl2 and will consult with ICU nurses.  Employment status:  Disabled (Comment on whether or not currently  receiving Disability) Insurance information:  Medicare PT Recommendations:  Not assessed at this time Information / Referral to community resources:  Skilled Nursing Facility [facility]  Patient/Family's Response to care: Would like to see him in better health.  Patient/Family's Understanding of and Emotional Response to Diagnosis, Current Treatment, and Prognosis:  Good understanding  Emotional Assessment Appearance:  Appears stated age Attitude/Demeanor/Rapport:  Unresponsive, Unable to Assess Affect (typically observed):  Unable to Assess Orientation:  Oriented to Self Alcohol / Substance use:  Not Applicable Psych involvement (Current and /or in the community):  No (Comment)  Discharge Needs  Concerns to be addressed:  Care Coordination, No discharge needs identified Readmission within the last 30 days:  No Current discharge risk:  Cognitively Impaired Barriers to Discharge:  No Barriers Identified   Cheron Schaumann, LCSW 09/04/2015, 3:44 PM

## 2015-09-04 NOTE — Progress Notes (Signed)
Pharmacy Antibiotic Note  Cory Salazar is a 55 y.o. male admitted on 09/04/2015 with sepsis and HCAP.  Pharmacy has been consulted for vancomycin and piperacillin/tazobactam dosing.  Plan:  Pt received one dose of vanc (1000mg  IV) and one dose of piperacillin/tazobactam (3.375g IV over 30 min) in ED. Due to CrCl 8.26mL/min, will check random vanc level from 24 hours after dose. Pt will continue piperacillin/tazobactam 3.375gm (over 4 hours) q12h this evening. Will continue to monitor renal function and for possible dialysis due to AKI.    Height: 6\' 1"  (185.4 cm) Weight: 145 lb 4.8 oz (65.9 kg) IBW/kg (Calculated) : 79.9  Temp (24hrs), Avg:99.9 F (37.7 C), Min:99.1 F (37.3 C), Max:102.1 F (38.9 C)   Recent Labs Lab 09/04/15 1102 09/04/15 1105  WBC 17.3*  --   CREATININE 9.15*  --   LATICACIDVEN  --  1.9    Estimated Creatinine Clearance: 8.7 mL/min (by C-G formula based on SCr of 9.15 mg/dL).    No Known Allergies  Antimicrobials this admission: Piperacillin/tazobactam 07/23 >> Vancomycin 07/23 >>    Microbiology results: 07/23 BCx: Pending 07/23 UCx: Pending 07/23 MRSA PCR: Pending  Thank you for allowing pharmacy to be a part of this patient's care.  Horris Latino, PharmD 09/04/2015 1:33 PM

## 2015-09-04 NOTE — Progress Notes (Signed)
Attempted to give patient water.  Unable to drink at this time.  Keppra given very slowly, patient appeared to be swallowing, but began coughing after.  Will hold PO meds until pt more alert.

## 2015-09-04 NOTE — Consult Note (Signed)
Date: 09/04/2015                  Patient Name:  Tanya Running  MRN: [id number]  DOB: 07-Nov-1960  Age / Sex: 55 y.o., male         PCP: No primary care provider on file.                 Service Requesting Consult: Internal Medicine                 Reason for Consult: ARF            History of Present Illness: Patient is a 55 y.o. male with medical problems of anxiety, depression, brain tumor surgery, h/o dementia, Diabetes, insulin dependent, h/o Seizures, resident of [facility] who was admitted to [facility] on 09/04/2015 for evaluation of fever, hypoxia.   On admission, Cr noted to be > 9 Baseline Cr 0.65 (06/2015)  Patient noted to be be febrile T max 101.7 admitted for e/m of SIRS   Medications: Outpatient medications: Prescriptions Prior to Admission  Medication Sig Dispense Refill Last Dose  . acetaminophen (TYLENOL) 325 MG tablet Take 650 mg by mouth every 4 (four) hours as needed for mild pain, moderate pain or fever.   unknown  . cefTRIAXone (ROCEPHIN) 1 g injection Inject 1 g into the muscle daily. For 7 days.   unknown  . docusate sodium (COLACE) 100 MG capsule Take 100 mg by mouth 2 (two) times daily.   unknown  . insulin aspart (NOVOLOG PENFILL) cartridge Inject 7 Units into the skin 3 (three) times daily with meals.   unknown  . insulin glargine (LANTUS) 100 unit/mL SOPN Inject 20 Units into the skin at bedtime.   unknown  . ipratropium-albuterol (DUONEB) 0.5-2.5 (3) MG/3ML SOLN Take 3 mLs by nebulization every 6 (six) hours. X 3 days   unknown  . lactulose (CHRONULAC) 10 GM/15ML solution Take 20 g by mouth 2 (two) times daily.   unknown  . lamoTRIgine (LAMICTAL) 100 MG tablet Take 100 mg by mouth 2 (two) times daily.   unknown  . levETIRAcetam (KEPPRA) 100 MG/ML solution Take 5 mLs (500 mg total) by mouth 2 (two) times daily. 473 mL 1 unknown  . mirtazapine (REMERON) 15 MG tablet Take 15 mg by mouth at bedtime. **Medication on hold from 09/01/15 to 09/04/15 per MD  at nursing home**   on hold  . phenytoin (DILANTIN) 50 MG tablet Chew 6 tablets (300 mg total) by mouth at bedtime. 90 tablet 1 unknown  . potassium chloride SA (K-DUR,KLOR-CON) 20 MEQ tablet Take 20 mEq by mouth 2 (two) times daily.   unknown  . QUEtiapine (SEROQUEL) 200 MG tablet Take 200 mg by mouth 2 (two) times daily. **Medication on hold from 09/01/15 to 09/04/15 per MD at [facility]**   on hold  . sertraline (ZOLOFT) 50 MG tablet Take 50 mg by mouth daily.   unknown  . tamsulosin (FLOMAX) 0.4 MG CAPS capsule Take 0.4 mg by mouth daily.   unknown    Current medications: Current Facility-Administered Medications  Medication Dose Route Frequency Provider Last Rate Last Dose  . 0.45 % sodium chloride infusion   Intravenous Continuous Marguarite Arbour, MD 125 mL/hr at 09/04/15 1253    . acetaminophen (TYLENOL) 325 MG suppository           . acetaminophen (TYLENOL) tablet 650 mg  650 mg Oral Q6H PRN Marguarite Arbour, MD  Or  . acetaminophen (TYLENOL) suppository 650 mg  650 mg Rectal Q6H PRN Marguarite Arbour, MD      . antiseptic oral rinse (CPC / CETYLPYRIDINIUM CHLORIDE 0.05%) solution 7 mL  7 mL Mouth Rinse q12n4p Marguarite Arbour, MD      . bisacodyl (DULCOLAX) suppository 10 mg  10 mg Rectal Daily PRN Marguarite Arbour, MD      . chlorhexidine (PERIDEX) 0.12 % solution 15 mL  15 mL Mouth Rinse BID Marguarite Arbour, MD      . docusate sodium (COLACE) capsule 100 mg  100 mg Oral BID Marguarite Arbour, MD      . Melene Muller ON 09/05/2015] famotidine (PEPCID) IVPB 20 mg premix  20 mg Intravenous Daily Marguarite Arbour, MD      . heparin injection 5,000 Units  5,000 Units Subcutaneous Q8H Marguarite Arbour, MD      . insulin aspart (novoLOG) injection 0-9 Units  0-9 Units Subcutaneous Q4H Marguarite Arbour, MD      . insulin glargine (LANTUS) injection 20 Units  20 Units Subcutaneous QHS Marguarite Arbour, MD      . ipratropium-albuterol (DUONEB) 0.5-2.5 (3) MG/3ML nebulizer solution 3 mL   3 mL Nebulization QID Marguarite Arbour, MD   3 mL at 09/04/15 1518  . lactulose (CHRONULAC) 10 GM/15ML solution 20 g  20 g Oral BID Marguarite Arbour, MD      . lamoTRIgine (LAMICTAL) tablet 100 mg  100 mg Oral BID Marguarite Arbour, MD      . levETIRAcetam (KEPPRA) 100 MG/ML solution 500 mg  500 mg Oral BID Marguarite Arbour, MD      . LORazepam (ATIVAN) injection 1 mg  1 mg Intravenous Q4H PRN Marguarite Arbour, MD   1 mg at 09/04/15 1301  . methylPREDNISolone sodium succinate (SOLU-MEDROL) 125 mg/2 mL injection 60 mg  60 mg Intravenous Q12H Marguarite Arbour, MD   60 mg at 09/04/15 1251  . [START ON 09/05/2015] mirtazapine (REMERON) tablet 15 mg  15 mg Oral QHS Marguarite Arbour, MD      . morphine 2 MG/ML injection 2 mg  2 mg Intravenous Q2H PRN Marguarite Arbour, MD      . ondansetron [facility]) tablet 4 mg  4 mg Oral Q6H PRN Marguarite Arbour, MD       Or  . ondansetron [facility]) injection 4 mg  4 mg Intravenous Q6H PRN Marguarite Arbour, MD      . piperacillin-tazobactam (ZOSYN) IVPB 3.375 g  3.375 g Intravenous Q12H Rolm Baptise, RPH      . sertraline (ZOLOFT) tablet 50 mg  50 mg Oral Daily Marguarite Arbour, MD      . sodium chloride flush (NS) 0.9 % injection 3 mL  3 mL Intravenous Q12H Marguarite Arbour, MD      . tamsulosin (FLOMAX) capsule 0.4 mg  0.4 mg Oral Daily Marguarite Arbour, MD          Allergies: No Known Allergies    Past Medical History: Past Medical History:  Diagnosis Date  . Anxiety disorder   . Diabetes mellitus without complication (HCC)   . Encephalopathy   . MDD (major depressive disorder) (HCC)   . Seizure [facility])      Past Surgical History: History reviewed. No pertinent surgical history.   Family History: History reviewed. No pertinent family history.   Social History: Social History   Social  History  . Marital status: Single    Spouse name: N/A  . Number of children: N/A  . Years of education: N/A   Occupational History  . Not on file.    Social History Main Topics  . Smoking status: Unknown If Ever Smoked  . Smokeless tobacco: Never Used  . Alcohol use Not on file     Comment: unable to assess  . Drug use: Unknown  . Sexual activity: Not on file   Other Topics Concern  . Not on file   Social History Narrative  . No narrative on file     Review of Systems: not available Gen:  HEENT:  CV:  Resp:  GI: GU :  MS:  Derm:   Psych: Heme:  Neuro:  Endocrine  Vital Signs: Blood pressure 111/75, pulse (!) 125, temperature (!) 101 F (38.3 C), resp. rate 11, height 6\' 1"  (1.854 m), weight 65.9 kg (145 lb 4.8 oz), SpO2 97 %.  No intake or output data in the 24 hours ending 09/04/15 1523  Weight trends: Filed Weights   09/04/15 1117  Weight: 65.9 kg (145 lb 4.8 oz)    Physical Exam: General:  Chronically ill appearing  HEENT PERRL, ?+ scleral icterus, dry oral mucus membranes  Neck:  supple  Lungs: Coarse b/l, normal effort  Heart::  irregular, tachycardic  Abdomen: Soft. Non distended,   Extremities:  no peripheral edema  Neurologic: Opens eyes to stimulation, not following commands  Skin: Warm, decreased turgor     Foley: present       Lab results: Basic Metabolic Panel:  Recent Labs Lab 09/04/15 1102  NA 146*  K 4.7  CL 112*  CO2 18*  GLUCOSE 344*  BUN 122*  CREATININE 9.15*  CALCIUM 9.4    Liver Function Tests:  Recent Labs Lab 09/04/15 1102  AST 30  ALT 20  ALKPHOS 175*  BILITOT 3.1*  PROT 8.0  ALBUMIN 2.8*   No results for input(s): LIPASE, AMYLASE in the last 168 hours. No results for input(s): AMMONIA in the last 168 hours.  CBC:  Recent Labs Lab 09/04/15 1102  WBC 17.3*  NEUTROABS 15.7*  HGB 12.5*  HCT 38.5*  MCV 78.1*  PLT 147*    Cardiac Enzymes: No results for input(s): CKTOTAL, TROPONINI in the last 168 hours.  BNP: Invalid input(s): POCBNP  CBG: No results for input(s): GLUCAP in the last 168 hours.  Microbiology: No results found for  this or any previous visit (from the past 720 hour(s)).   Coagulation Studies: No results for input(s): LABPROT, INR in the last 72 hours.  Urinalysis:  Recent Labs  09/04/15 1125  COLORURINE AMBER*  LABSPEC 1.014  PHURINE 5.0  GLUCOSEU 150*  HGBUR 2+*  BILIRUBINUR NEGATIVE  KETONESUR NEGATIVE  PROTEINUR 100*  NITRITE NEGATIVE  LEUKOCYTESUR 3+*        Imaging: US Renal  Result Date: 09/04/2015 CLINICAL DATA:  Acute renal failure EXAM: RENAL / URINARY TRACT ULTRASOUND COMPLETE COMPARISON:  None. FINDINGS: Right Kidney: Length: 12.7 cm.  Slight increased cortical echogenicity Left Kidney: Length: 12.9 cm.  Mild hydronephrosis Bladder: The bladder is decompressed with a Foley catheter IMPRESSION: 1. Mild hydronephrosis on the left, age indeterminate. If there is concern for acute obstruction, CT scan may better evaluate. Electronically Signed   By: Gerome Sam III M.D   On: 09/04/2015 13:50  Dg Chest Port 1 View  Result Date: 09/04/2015 CLINICAL DATA:  Fever, probable sepsis, diabetic. Pt unable  to give hx at this time EXAM: PORTABLE CHEST 1 VIEW COMPARISON:  07/10/2015 FINDINGS: Midline trachea. Normal heart size. Numerous leads and wires project over the chest. No pleural effusion or pneumothorax. Mild medial right lung base airspace disease is suspected. Clear left lung. IMPRESSION: Subtle medial right lung airspace disease. Although this could represent atelectasis, given the clinical history, suspicious for early infection. If possible, PA and lateral radiographs should be considered. Electronically Signed   By: Jeronimo Greaves M.D.   On: 09/04/2015 11:30     Assessment & Plan: Pt is a 55 y.o. caucsian male with medical problems of anxiety, depression, brain tumor surgery, h/o dementia, Diabetes, insulin dependent, h/o Seizures, resident of [facility] who was admitted to [facility] on 09/04/2015 for evaluation of fever, hypoxia.    1. ARF, likely severe dehydration  leading to ATN. Baseline normal 2. Acidosis, lactic  3. Hyperglycemia 4. Possible rt lung pneumonia 5. UTI 6. Hypernatremia  Plan:  ARF is likely secondary to severe dehydration leading to ATN Agree with volume resucitation Monitor Volume and electrolytes closely No acute indication of HD at present

## 2015-09-05 ENCOUNTER — Inpatient Hospital Stay (HOSPITAL_COMMUNITY): Payer: Medicare Other

## 2015-09-05 ENCOUNTER — Encounter (HOSPITAL_COMMUNITY): Payer: Self-pay

## 2015-09-05 ENCOUNTER — Inpatient Hospital Stay: Payer: Medicare Other

## 2015-09-05 ENCOUNTER — Inpatient Hospital Stay (HOSPITAL_COMMUNITY)
Admission: AD | Admit: 2015-09-05 | Discharge: 2015-09-24 | DRG: 870 | Disposition: A | Discharge status: Skilled Nursing Facility | Payer: Medicare Other | Source: Other Acute Inpatient Hospital | Attending: Internal Medicine | Admitting: Internal Medicine

## 2015-09-05 DIAGNOSIS — R6510 Systemic inflammatory response syndrome (SIRS) of non-infectious origin without acute organ dysfunction: Secondary | ICD-10-CM

## 2015-09-05 DIAGNOSIS — Z681 Body mass index (BMI) 19 or less, adult: Secondary | ICD-10-CM

## 2015-09-05 DIAGNOSIS — N3000 Acute cystitis without hematuria: Secondary | ICD-10-CM | POA: Diagnosis not present

## 2015-09-05 DIAGNOSIS — A4152 Sepsis due to Pseudomonas: Secondary | ICD-10-CM | POA: Diagnosis not present

## 2015-09-05 DIAGNOSIS — Z66 Do not resuscitate: Secondary | ICD-10-CM | POA: Diagnosis present

## 2015-09-05 DIAGNOSIS — Y95 Nosocomial condition: Secondary | ICD-10-CM | POA: Diagnosis not present

## 2015-09-05 DIAGNOSIS — J9600 Acute respiratory failure, unspecified whether with hypoxia or hypercapnia: Secondary | ICD-10-CM

## 2015-09-05 DIAGNOSIS — N1339 Other hydronephrosis: Secondary | ICD-10-CM | POA: Diagnosis not present

## 2015-09-05 DIAGNOSIS — J939 Pneumothorax, unspecified: Secondary | ICD-10-CM | POA: Diagnosis not present

## 2015-09-05 DIAGNOSIS — L8990 Pressure ulcer of unspecified site, unspecified stage: Secondary | ICD-10-CM | POA: Insufficient documentation

## 2015-09-05 DIAGNOSIS — J151 Pneumonia due to Pseudomonas: Secondary | ICD-10-CM | POA: Diagnosis not present

## 2015-09-05 DIAGNOSIS — D696 Thrombocytopenia, unspecified: Secondary | ICD-10-CM | POA: Diagnosis not present

## 2015-09-05 DIAGNOSIS — E876 Hypokalemia: Secondary | ICD-10-CM | POA: Diagnosis present

## 2015-09-05 DIAGNOSIS — N1330 Unspecified hydronephrosis: Secondary | ICD-10-CM

## 2015-09-05 DIAGNOSIS — Z4659 Encounter for fitting and adjustment of other gastrointestinal appliance and device: Secondary | ICD-10-CM

## 2015-09-05 DIAGNOSIS — R1310 Dysphagia, unspecified: Secondary | ICD-10-CM

## 2015-09-05 DIAGNOSIS — Z01818 Encounter for other preprocedural examination: Secondary | ICD-10-CM

## 2015-09-05 DIAGNOSIS — N139 Obstructive and reflux uropathy, unspecified: Secondary | ICD-10-CM

## 2015-09-05 DIAGNOSIS — E162 Hypoglycemia, unspecified: Secondary | ICD-10-CM | POA: Diagnosis not present

## 2015-09-05 DIAGNOSIS — G40909 Epilepsy, unspecified, not intractable, without status epilepticus: Secondary | ICD-10-CM | POA: Diagnosis present

## 2015-09-05 DIAGNOSIS — N179 Acute kidney failure, unspecified: Secondary | ICD-10-CM

## 2015-09-05 DIAGNOSIS — I471 Supraventricular tachycardia: Secondary | ICD-10-CM | POA: Diagnosis not present

## 2015-09-05 DIAGNOSIS — R1312 Dysphagia, oropharyngeal phase: Secondary | ICD-10-CM | POA: Diagnosis present

## 2015-09-05 DIAGNOSIS — N400 Enlarged prostate without lower urinary tract symptoms: Secondary | ICD-10-CM | POA: Diagnosis present

## 2015-09-05 DIAGNOSIS — Z9289 Personal history of other medical treatment: Secondary | ICD-10-CM

## 2015-09-05 DIAGNOSIS — J40 Bronchitis, not specified as acute or chronic: Secondary | ICD-10-CM | POA: Diagnosis present

## 2015-09-05 DIAGNOSIS — E119 Type 2 diabetes mellitus without complications: Secondary | ICD-10-CM | POA: Diagnosis not present

## 2015-09-05 DIAGNOSIS — E440 Moderate protein-calorie malnutrition: Secondary | ICD-10-CM | POA: Diagnosis present

## 2015-09-05 DIAGNOSIS — Z794 Long term (current) use of insulin: Secondary | ICD-10-CM | POA: Diagnosis not present

## 2015-09-05 DIAGNOSIS — R627 Adult failure to thrive: Secondary | ICD-10-CM | POA: Diagnosis present

## 2015-09-05 DIAGNOSIS — J690 Pneumonitis due to inhalation of food and vomit: Secondary | ICD-10-CM

## 2015-09-05 DIAGNOSIS — F329 Major depressive disorder, single episode, unspecified: Secondary | ICD-10-CM | POA: Diagnosis not present

## 2015-09-05 DIAGNOSIS — L89601 Pressure ulcer of unspecified heel, stage 1: Secondary | ICD-10-CM | POA: Diagnosis present

## 2015-09-05 DIAGNOSIS — F0390 Unspecified dementia without behavioral disturbance: Secondary | ICD-10-CM | POA: Diagnosis present

## 2015-09-05 DIAGNOSIS — N132 Hydronephrosis with renal and ureteral calculous obstruction: Secondary | ICD-10-CM

## 2015-09-05 DIAGNOSIS — E869 Volume depletion, unspecified: Secondary | ICD-10-CM | POA: Diagnosis present

## 2015-09-05 DIAGNOSIS — N170 Acute kidney failure with tubular necrosis: Secondary | ICD-10-CM | POA: Diagnosis not present

## 2015-09-05 DIAGNOSIS — A419 Sepsis, unspecified organism: Secondary | ICD-10-CM | POA: Diagnosis present

## 2015-09-05 DIAGNOSIS — E872 Acidosis: Secondary | ICD-10-CM | POA: Diagnosis present

## 2015-09-05 DIAGNOSIS — R5383 Other fatigue: Secondary | ICD-10-CM | POA: Diagnosis present

## 2015-09-05 DIAGNOSIS — E870 Hyperosmolality and hypernatremia: Secondary | ICD-10-CM | POA: Diagnosis present

## 2015-09-05 DIAGNOSIS — G92 Toxic encephalopathy: Secondary | ICD-10-CM | POA: Diagnosis not present

## 2015-09-05 DIAGNOSIS — Z7189 Other specified counseling: Secondary | ICD-10-CM

## 2015-09-05 DIAGNOSIS — J9601 Acute respiratory failure with hypoxia: Secondary | ICD-10-CM

## 2015-09-05 DIAGNOSIS — J9690 Respiratory failure, unspecified, unspecified whether with hypoxia or hypercapnia: Secondary | ICD-10-CM

## 2015-09-05 DIAGNOSIS — R0902 Hypoxemia: Secondary | ICD-10-CM

## 2015-09-05 DIAGNOSIS — J189 Pneumonia, unspecified organism: Secondary | ICD-10-CM | POA: Diagnosis not present

## 2015-09-05 DIAGNOSIS — Z978 Presence of other specified devices: Secondary | ICD-10-CM

## 2015-09-05 HISTORY — DX: Dysphagia, unspecified: R13.10

## 2015-09-05 LAB — CBC
HEMATOCRIT: 32 % — AB (ref 39.0–52.0)
HEMATOCRIT: 32.6 % — AB (ref 40.0–52.0)
HEMOGLOBIN: 10 g/dL — AB (ref 13.0–17.0)
HEMOGLOBIN: 10.7 g/dL — AB (ref 13.0–18.0)
MCH: 25.8 pg — ABNORMAL LOW (ref 26.0–34.0)
MCH: 24.9 pg — ABNORMAL LOW (ref 26.0–34.0)
MCHC: 33 g/dL (ref 32.0–36.0)
MCHC: 31.3 g/dL (ref 30.0–36.0)
MCV: 78.1 fL — ABNORMAL LOW (ref 80.0–100.0)
MCV: 79.8 fL (ref 78.0–100.0)
Platelets: 82 10*3/uL — ABNORMAL LOW (ref 150–440)
Platelets: 73 10*3/uL — ABNORMAL LOW (ref 150–400)
RBC: 4.01 MIL/uL — AB (ref 4.22–5.81)
RBC: 4.17 MIL/uL — AB (ref 4.40–5.90)
RDW: 16.2 % — AB (ref 11.5–15.5)
RDW: 16.7 % — AB (ref 11.5–14.5)
WBC: 13.5 10*3/uL — AB (ref 4.0–10.5)
WBC: 15.1 10*3/uL — AB (ref 3.8–10.6)

## 2015-09-05 LAB — COMPREHENSIVE METABOLIC PANEL
ALT: 17 U/L (ref 17–63)
ANION GAP: 13 (ref 5–15)
AST: 24 U/L (ref 15–41)
Albumin: 2.2 g/dL — ABNORMAL LOW (ref 3.5–5.0)
Alkaline Phosphatase: 120 U/L (ref 38–126)
BILIRUBIN TOTAL: 3.5 mg/dL — AB (ref 0.3–1.2)
BUN: 117 mg/dL — ABNORMAL HIGH (ref 6–20)
CO2: 16 mmol/L — ABNORMAL LOW (ref 22–32)
Calcium: 8.5 mg/dL — ABNORMAL LOW (ref 8.9–10.3)
Chloride: 117 mmol/L — ABNORMAL HIGH (ref 101–111)
Creatinine, Ser: 9.08 mg/dL — ABNORMAL HIGH (ref 0.61–1.24)
GFR calc Af Amer: 7 mL/min — ABNORMAL LOW (ref >60)
GFR, EST NON AFRICAN AMERICAN: 6 mL/min — AB (ref >60)
Glucose, Bld: 270 mg/dL — ABNORMAL HIGH (ref 65–99)
POTASSIUM: 4.2 mmol/L (ref 3.5–5.1)
Sodium: 146 mmol/L — ABNORMAL HIGH (ref 135–145)
TOTAL PROTEIN: 6.2 g/dL — AB (ref 6.5–8.1)

## 2015-09-05 LAB — GASTROINTESTINAL PANEL BY PCR, STOOL (REPLACES STOOL CULTURE)
ADENOVIRUS F40/41: NOT DETECTED
Astrovirus: NOT DETECTED
CRYPTOSPORIDIUM: NOT DETECTED
CYCLOSPORA CAYETANENSIS: NOT DETECTED
Campylobacter species: NOT DETECTED
E. coli O157: NOT DETECTED
ENTAMOEBA HISTOLYTICA: NOT DETECTED
ENTEROPATHOGENIC E COLI (EPEC): NOT DETECTED
Enteroaggregative E coli (EAEC): NOT DETECTED
Enterotoxigenic E coli (ETEC): NOT DETECTED
GIARDIA LAMBLIA: NOT DETECTED
Norovirus GI/GII: NOT DETECTED
Plesimonas shigelloides: NOT DETECTED
ROTAVIRUS A: NOT DETECTED
SHIGA LIKE TOXIN PRODUCING E COLI (STEC): NOT DETECTED
Salmonella species: NOT DETECTED
Sapovirus (I, II, IV, and V): NOT DETECTED
Shigella/Enteroinvasive E coli (EIEC): NOT DETECTED
VIBRIO CHOLERAE: NOT DETECTED
VIBRIO SPECIES: NOT DETECTED
YERSINIA ENTEROCOLITICA: NOT DETECTED

## 2015-09-05 LAB — RENAL FUNCTION PANEL
ALBUMIN: 2 g/dL — AB (ref 3.5–5.0)
ALBUMIN: 2 g/dL — AB (ref 3.5–5.0)
ANION GAP: 15 (ref 5–15)
Anion gap: 18 — ABNORMAL HIGH (ref 5–15)
BUN: 126 mg/dL — ABNORMAL HIGH (ref 6–20)
BUN: 129 mg/dL — AB (ref 6–20)
CALCIUM: 8.9 mg/dL (ref 8.9–10.3)
CALCIUM: 8.4 mg/dL — AB (ref 8.9–10.3)
CO2: 13 mmol/L — ABNORMAL LOW (ref 22–32)
CO2: 11 mmol/L — ABNORMAL LOW (ref 22–32)
CREATININE: 8.94 mg/dL — AB (ref 0.61–1.24)
Chloride: 115 mmol/L — ABNORMAL HIGH (ref 101–111)
Chloride: 115 mmol/L — ABNORMAL HIGH (ref 101–111)
Creatinine, Ser: 9.57 mg/dL — ABNORMAL HIGH (ref 0.61–1.24)
GFR calc non Af Amer: 5 mL/min — ABNORMAL LOW (ref >60)
GFR, EST AFRICAN AMERICAN: 6 mL/min — AB (ref >60)
GFR, EST AFRICAN AMERICAN: 7 mL/min — AB (ref >60)
GFR, EST NON AFRICAN AMERICAN: 6 mL/min — AB (ref >60)
Glucose, Bld: 228 mg/dL — ABNORMAL HIGH (ref 65–99)
Glucose, Bld: 220 mg/dL — ABNORMAL HIGH (ref 65–99)
PHOSPHORUS: 5.6 mg/dL — AB (ref 2.5–4.6)
PHOSPHORUS: 5.4 mg/dL — AB (ref 2.5–4.6)
POTASSIUM: 4.1 mmol/L (ref 3.5–5.1)
Potassium: 4 mmol/L (ref 3.5–5.1)
SODIUM: 143 mmol/L (ref 135–145)
SODIUM: 144 mmol/L (ref 135–145)

## 2015-09-05 LAB — BASIC METABOLIC PANEL
ANION GAP: 15 (ref 5–15)
BUN: 125 mg/dL — ABNORMAL HIGH (ref 6–20)
CALCIUM: 8.6 mg/dL — AB (ref 8.9–10.3)
CHLORIDE: 115 mmol/L — AB (ref 101–111)
CO2: 14 mmol/L — AB (ref 22–32)
Creatinine, Ser: 8.59 mg/dL — ABNORMAL HIGH (ref 0.61–1.24)
GFR calc non Af Amer: 6 mL/min — ABNORMAL LOW (ref >60)
GFR, EST AFRICAN AMERICAN: 7 mL/min — AB (ref >60)
GLUCOSE: 227 mg/dL — AB (ref 65–99)
POTASSIUM: 4.1 mmol/L (ref 3.5–5.1)
Sodium: 144 mmol/L (ref 135–145)

## 2015-09-05 LAB — LACTIC ACID, PLASMA: Lactic Acid, Venous: 1.1 mmol/L (ref 0.5–1.9)

## 2015-09-05 LAB — PHENYTOIN LEVEL, TOTAL: Phenytoin Lvl: 5.7 ug/mL — ABNORMAL LOW (ref 10.0–20.0)

## 2015-09-05 LAB — C DIFFICILE QUICK SCREEN W PCR REFLEX
C DIFFICLE (CDIFF) ANTIGEN: NEGATIVE
C Diff interpretation: NOT DETECTED
C Diff toxin: NEGATIVE

## 2015-09-05 LAB — GLUCOSE, CAPILLARY
GLUCOSE-CAPILLARY: 209 mg/dL — AB (ref 65–99)
GLUCOSE-CAPILLARY: 202 mg/dL — AB (ref 65–99)
Glucose-Capillary: 201 mg/dL — ABNORMAL HIGH (ref 65–99)

## 2015-09-05 LAB — MAGNESIUM: Magnesium: 3 mg/dL — ABNORMAL HIGH (ref 1.7–2.4)

## 2015-09-05 LAB — URINE CULTURE
CULTURE: NO GROWTH
SPECIAL REQUESTS: NORMAL

## 2015-09-05 LAB — PHOSPHORUS: PHOSPHORUS: 6 mg/dL — AB (ref 2.5–4.6)

## 2015-09-05 MED ORDER — SERTRALINE HCL 50 MG PO TABS
50.0000 mg | ORAL_TABLET | Freq: Every day | ORAL | Status: DC
  Administered 2015-09-09 – 2015-09-24 (×15): 50 mg via ORAL
  Filled 2015-09-05 (×16): qty 1

## 2015-09-05 MED ORDER — HEPARIN SODIUM (PORCINE) 5000 UNIT/ML IJ SOLN
5000.0000 [IU] | Freq: Three times a day (TID) | INTRAMUSCULAR | Status: DC
  Administered 2015-09-06 – 2015-09-07 (×2): 5000 [IU] via SUBCUTANEOUS
  Filled 2015-09-05 (×3): qty 1

## 2015-09-05 MED ORDER — HEPARIN SODIUM (PORCINE) 1000 UNIT/ML DIALYSIS: 1000.0000 [IU] | INTRAMUSCULAR | Status: DC | PRN

## 2015-09-05 MED ORDER — LORAZEPAM 2 MG/ML IJ SOLN: 1.0000 mg | INTRAMUSCULAR | 0 refills | Status: DC | PRN

## 2015-09-05 MED ORDER — SODIUM CHLORIDE 0.9 % IV SOLN
INTRAVENOUS | Status: DC
  Administered 2015-09-05: 19:00:00 via INTRAVENOUS

## 2015-09-05 MED ORDER — PIPERACILLIN-TAZOBACTAM 3.375 G IVPB
3.3750 g | Freq: Three times a day (TID) | INTRAVENOUS | Status: DC
  Administered 2015-09-05: 3.375 g via INTRAVENOUS
  Filled 2015-09-05 (×2): qty 50

## 2015-09-05 MED ORDER — FAMOTIDINE IN NACL 20-0.9 MG/50ML-% IV SOLN
20.0000 mg | INTRAVENOUS | Status: DC
  Administered 2015-09-05: 20 mg via INTRAVENOUS
  Filled 2015-09-05: qty 50

## 2015-09-05 MED ORDER — VANCOMYCIN HCL IN DEXTROSE 1-5 GM/200ML-% IV SOLN: 1000.0000 mg | INTRAVENOUS | Status: DC

## 2015-09-05 MED ORDER — LACTULOSE 10 GM/15ML PO SOLN
20.0000 g | Freq: Two times a day (BID) | ORAL | Status: DC
  Administered 2015-09-08 – 2015-09-11 (×7): 20 g via ORAL
  Filled 2015-09-05 (×10): qty 30

## 2015-09-05 MED ORDER — INSULIN ASPART 100 UNIT/ML ~~LOC~~ SOLN
0.0000 [IU] | Freq: Every day | SUBCUTANEOUS | Status: DC
  Administered 2015-09-06: 3 [IU] via SUBCUTANEOUS
  Administered 2015-09-06 – 2015-09-07 (×2): 2 [IU] via SUBCUTANEOUS

## 2015-09-05 MED ORDER — INSULIN GLARGINE 100 UNIT/ML ~~LOC~~ SOLN: 20.0000 [IU] | Freq: Every day | SUBCUTANEOUS | 11 refills | Status: DC

## 2015-09-05 MED ORDER — LAMOTRIGINE 100 MG PO TABS
100.0000 mg | ORAL_TABLET | Freq: Two times a day (BID) | ORAL | Status: DC
  Administered 2015-09-08 – 2015-09-18 (×21): 100 mg via ORAL
  Filled 2015-09-05 (×28): qty 1

## 2015-09-05 MED ORDER — SODIUM CHLORIDE 0.9 % IV SOLN: 500.0000 mg | Freq: Two times a day (BID) | INTRAVENOUS | Status: DC

## 2015-09-05 MED ORDER — TAMSULOSIN HCL 0.4 MG PO CAPS
0.4000 mg | ORAL_CAPSULE | Freq: Every day | ORAL | Status: DC
  Administered 2015-09-09 – 2015-09-10 (×2): 0.4 mg via ORAL
  Filled 2015-09-05 (×3): qty 1

## 2015-09-05 MED ORDER — LACTULOSE 10 GM/15ML PO SOLN: 20.0000 g | Freq: Two times a day (BID) | ORAL | 0 refills | Status: AC

## 2015-09-05 MED ORDER — LORAZEPAM 2 MG/ML IJ SOLN
1.0000 mg | INTRAMUSCULAR | Status: DC | PRN
  Administered 2015-09-17: 1 mg via INTRAVENOUS
  Filled 2015-09-05: qty 1

## 2015-09-05 MED ORDER — SODIUM CHLORIDE 0.9 % IV SOLN
500.0000 mg | Freq: Two times a day (BID) | INTRAVENOUS | Status: DC
  Administered 2015-09-06 – 2015-09-11 (×13): 500 mg via INTRAVENOUS
  Filled 2015-09-05 (×15): qty 5

## 2015-09-05 MED ORDER — LAMOTRIGINE 100 MG PO TABS: 100.0000 mg | ORAL_TABLET | Freq: Two times a day (BID) | ORAL | Status: AC

## 2015-09-05 MED ORDER — INSULIN ASPART 100 UNIT/ML ~~LOC~~ SOLN
0.0000 [IU] | Freq: Three times a day (TID) | SUBCUTANEOUS | Status: DC
  Administered 2015-09-06 – 2015-09-07 (×5): 3 [IU] via SUBCUTANEOUS
  Administered 2015-09-07 – 2015-09-08 (×2): 5 [IU] via SUBCUTANEOUS

## 2015-09-05 MED ORDER — CHLORHEXIDINE GLUCONATE 0.12 % MT SOLN
15.0000 mL | Freq: Two times a day (BID) | OROMUCOSAL | Status: DC
  Administered 2015-09-05 – 2015-09-12 (×7): 15 mL via OROMUCOSAL
  Filled 2015-09-05 (×3): qty 15

## 2015-09-05 MED ORDER — PIPERACILLIN-TAZOBACTAM 3.375 G IVPB: 3.3750 g | Freq: Three times a day (TID) | INTRAVENOUS | Status: DC

## 2015-09-05 MED ORDER — PIPERACILLIN-TAZOBACTAM IN DEX 2-0.25 GM/50ML IV SOLN
2.2500 g | Freq: Three times a day (TID) | INTRAVENOUS | Status: DC
  Administered 2015-09-05 – 2015-09-06 (×2): 2.25 g via INTRAVENOUS
  Filled 2015-09-05 (×3): qty 50

## 2015-09-05 MED ORDER — MIRTAZAPINE 15 MG PO TABS: 15.0000 mg | ORAL_TABLET | Freq: Every day | ORAL | Status: AC

## 2015-09-05 MED ORDER — SODIUM CHLORIDE 0.9% FLUSH
3.0000 mL | Freq: Two times a day (BID) | INTRAVENOUS | Status: DC
  Administered 2015-09-06 (×2): 3 mL via INTRAVENOUS
  Administered 2015-09-07: 10 mL via INTRAVENOUS
  Administered 2015-09-08 – 2015-09-12 (×10): 3 mL via INTRAVENOUS

## 2015-09-05 MED ORDER — SODIUM CHLORIDE 0.45 % IV SOLN: 100.0000 mL | INTRAVENOUS | 0 refills | Status: DC

## 2015-09-05 MED ORDER — IPRATROPIUM-ALBUTEROL 0.5-2.5 (3) MG/3ML IN SOLN: 3.0000 mL | Freq: Four times a day (QID) | RESPIRATORY_TRACT | Status: AC

## 2015-09-05 MED ORDER — PHENYTOIN 50 MG PO CHEW
300.0000 mg | CHEWABLE_TABLET | Freq: Every day | ORAL | Status: DC
  Filled 2015-09-05 (×2): qty 6

## 2015-09-05 MED ORDER — PUREFLOW DIALYSIS SOLUTION: INTRAVENOUS | Status: DC

## 2015-09-05 MED ORDER — CETYLPYRIDINIUM CHLORIDE 0.05 % MT LIQD
7.0000 mL | Freq: Two times a day (BID) | OROMUCOSAL | Status: DC
  Administered 2015-09-06 – 2015-09-08 (×5): 7 mL via OROMUCOSAL

## 2015-09-05 MED ORDER — SODIUM CHLORIDE 0.9 % IV BOLUS (SEPSIS)
500.0000 mL | Freq: Once | INTRAVENOUS | Status: AC
  Administered 2015-09-05: 500 mL via INTRAVENOUS

## 2015-09-05 MED ORDER — SODIUM CHLORIDE 0.9% FLUSH: 3.0000 mL | Freq: Two times a day (BID) | INTRAVENOUS | Status: DC

## 2015-09-05 NOTE — Care Management (Signed)
Patient appears to be from Long-term care at [facility]. Sister Meghan Faatz per CSW note [phone]. CRRT to start today. Patient with increased sleeping. Currently on Room Air. RNCM will follow along with CSW.

## 2015-09-05 NOTE — Progress Notes (Signed)
Discussed with [facility] intensivist dr.Dedios regarding the transfer process which is urgent. He has recommended to talk to try a hospitalist as the patient meets criteria for stepdown

## 2015-09-05 NOTE — Progress Notes (Signed)
Speech Therapy Note: received order, reviewed chart notes and consulted NSG re: pt's status. NSG cleaning pt up post dialysis catheter placement this morning. Pt is currently lethargic and poorly alert for attempting a BSE at this time. As pt has exhibited episodes of dysphagia w/ NSG yesterday per chart notes, and d/t his presentation now which greatly increases risk for aspiration, recommend pt be NPO until a full BSE can be completed in order to assess safety w/ po's and establish a least restrictive diet consistency. MD informed of this recommendation (pt has a regular consistency diet order placed by MD at admission). NSG informed of this recommendation as well. NSG reported pt was being transferred to [facility] for further care this PM. ST will be available and will attempt BSE tomorrow if pt remains at this facility.

## 2015-09-05 NOTE — Consult Note (Signed)
PULMONARY / CRITICAL CARE MEDICINE   Name: Cory Salazar MRN: [id number] DOB: July 17, 1960    ADMISSION DATE:  09/04/2015 CONSULTATION DATE:  09/05/15  REFERRING MD: Amado Coe, A  CHIEF COMPLAINT:  ARF  HISTORY OF PRESENT ILLNESS:    Cory Salazar is a 55 year old male with past medical history significant for diabetes mellitus, encephalopathy, brain tumor status post resection, major depressive disorder and seizures. Patient was brought to the ED on 7/23 with tachycardia and fever. Chest x-ray was concerning for right-sided pneumonia. Patient was also noted to have acute renal failure. Patient was started on broad-spectrum antibiotics. Patient continues to have elevated, BUN went up to 117 and creatinine up to 9.08. Urine output was only 255 cc over 24 hours on 7/23. Therefore [facility] team was consulted to place a temporary dialysis catheter for CRRT.  PAST MEDICAL HISTORY :  He  has a past medical history of Anxiety disorder; Diabetes mellitus without complication (HCC); Encephalopathy; MDD (major depressive disorder) (HCC); and Seizure (HCC).  PAST SURGICAL HISTORY: He  has no past surgical history on file.  No Known Allergies  No current facility-administered medications on file prior to encounter.    Current Outpatient Prescriptions on File Prior to Encounter  Medication Sig  . levETIRAcetam (KEPPRA) 100 MG/ML solution Take 5 mLs (500 mg total) by mouth 2 (two) times daily.  . phenytoin (DILANTIN) 50 MG tablet Chew 6 tablets (300 mg total) by mouth at bedtime.    FAMILY HISTORY:  His has no family status information on file.    SOCIAL HISTORY: He  has an unknown smoking status. He has never used smokeless tobacco.  REVIEW OF SYSTEMS:   Unable to obtain due to altered mental status  SUBJECTIVE:  Unable to obtain  VITAL SIGNS: BP (!) 86/61   Pulse 84   Temp 98.1 F (36.7 C) (Rectal)   Resp 14   Ht  (1.854 m)   Wt 146 lb 6.2 oz (66.4 kg)   SpO2 96%   BMI 19.31 kg/m    HEMODYNAMICS:    VENTILATOR SETTINGS:    INTAKE / OUTPUT: I/O last 3 completed shifts: In: 2419.6 [I.V.:2264.6; IV Piggyback:155] Out: 255 [Urine:255]  PHYSICAL EXAMINATION: General:  Awake, Calm , on room air Neuro: Awake, but confused HEENT:  Ataumatic, normocephalic, pupils -size 4 reactive, ptosis right eye Cardiovascular:S1S2, regular, no MRG noted Lungs:  Diminished right lower, no wheezes, crackles, rhonchi Abdomen:  Soft, non tender Musculoskeletal: no inflammation/deformity noted Skin:  Grossly intact  LABS:  BMET  Recent Labs Lab 09/04/15 1102 09/04/15 1418 09/05/15 0059  NA 146* 148* 146*  K 4.7 4.1 4.2  CL 112* 119* 117*  CO2 18* 15* 16*  BUN 122* 108* 117*  CREATININE 9.15* 8.66* 9.08*  GLUCOSE 344* 280* 270*    Electrolytes  Recent Labs Lab 09/04/15 1102 09/04/15 1418 09/05/15 0059  CALCIUM 9.4 8.4* 8.5*    CBC  Recent Labs Lab 09/04/15 1102 09/05/15 0059  WBC 17.3* 15.1*  HGB 12.5* 10.7*  HCT 38.5* 32.6*  PLT 147* 82*    Coag's No results for input(s): APTT, INR in the last 168 hours.  Sepsis Markers  Recent Labs Lab 09/04/15 1105 09/04/15 1418 09/05/15 0059  LATICACIDVEN 1.9 2.9* 1.1    ABG  Recent Labs Lab 09/04/15 1102  PHART 7.30*  PCO2ART 35  PO2ART 65*    Liver Enzymes  Recent Labs Lab 09/04/15 1102 09/05/15 0059  AST 30 24  ALT 20 17  ALKPHOS 175* 120  BILITOT 3.1* 3.5*  ALBUMIN 2.8* 2.2*    Cardiac Enzymes No results for input(s): TROPONINI, PROBNP in the last 168 hours.  Glucose  Recent Labs Lab 09/04/15 1544 09/04/15 2024 09/04/15 2337 09/05/15 0345 09/05/15 0737  GLUCAP 240* 261* 269* 209* 201*    Imaging US Renal  Result Date: 09/04/2015 CLINICAL DATA:  Acute renal failure EXAM: RENAL / URINARY TRACT ULTRASOUND COMPLETE COMPARISON:  None. FINDINGS: Right Kidney: Length: 12.7 cm.  Slight increased cortical echogenicity Left Kidney: Length: 12.9 cm.  Mild hydronephrosis  Bladder: The bladder is decompressed with a Foley catheter IMPRESSION: 1. Mild hydronephrosis on the left, age indeterminate. If there is concern for acute obstruction, CT scan may better evaluate. Electronically Signed   By: Gerome Sam III M.D   On: 09/04/2015 13:50  Portable Chest 1 View  Result Date: 09/05/2015 CLINICAL DATA:  Pneumonia EXAM: PORTABLE CHEST 1 VIEW COMPARISON:  September 04, 2015 FINDINGS: There is patchy airspace consolidation in the right base. There is new mild atelectatic change in the left base, possibly represent a second focus of early pneumonia. Lungs elsewhere clear. Heart is upper normal in size with pulmonary vascularity within normal limits. No adenopathy. No bone lesions. IMPRESSION: Patchy airspace opacity in the right apex consistent with a degree of pneumonia. Atelectasis is in the left lower lobe, new ; question a second focus of pneumonia developing in the left base. Lungs elsewhere clear. Stable cardiac silhouette. Electronically Signed   By: Bretta Bang III M.D.   On: 09/05/2015 07:15    STUDIES:  7/23 renal ultrasound>>. Mild hydronephrosis on the left,   CULTURES: 7/23 blood cultures>> NGTD 7/24 C. Difficile>> negative  ANTIBIOTICS: 7/23 Zosyn>>   7/23 vancomycin >> 7/24   SIGNIFICANT EVENTS: 7/23 patient admitted to the ICU with right-sided pneumonia and renal failure requiring CRRT  LINES/TUBES: 7/24 left femoral>>  DISCUSSION: 55 year old with diabetes mellitus, encephalopathy, brain tumor status post resection, major depressive disorder and seizures, presenting with right-sided pneumonia and renal failure requiring emergent CRRT  ASSESSMENT / PLAN:  PULMONARY A: HCAP P:   Continue Zosyn DC vancomycin Currently on RA Bronchodilators Follow cultures CXR in a.m. D/C Methylprednisone  CARDIOVASCULAR A:  No active issues P:  Continuous telemetry Keep MAP>65  RENAL A:   Acute renal failure Left-sided  hydronephrosis Lactic acidosis - improving Hypernatremia -resolving slowly P:   Nephrology consulted, input highly appreciated Plan for CRRT on 7/24 Replace electrolytes per ICU protocol Renal diet BMET in am 0.45n/s@125   GASTROINTESTINAL A:   No active issues P:   Soft diet Continue Pepcid   HEMATOLOGIC A:   Hypoalbuminemia Thrombocytopenia P:  Heparin for DVT prophylaxis scds Will transfuse if Hgb <7   INFECTIOUS A:    Right sided pneumonia Urinary tract infection Leukocytosis related to infection P:   Continue antibiotics Monitor fever curve Follow cultures  ENDOCRINE A:   Diabetes mellitus P:   Blood sugar checks q4 hour SSI coverage  NEUROLOGIC A:    History of encephalopathy,S/P brain tumor resection  History of MDD History of seizures P:   RASS goal: 0  Continue Kepra /lamictal Will start dilantin after we check dilantin level, since the patient got loaded yesterday Continue Zoloft/Remeron       Bincy Varughese,AG-ACNP Pulmonary and Critical Care Medicine [facility]   09/05/2015, 11:59 AM  Pt seen and examined with NP, agree with assessment and plan. History of seizures and brain tumor resection, no history available in Epic,  and no family available to provide further information. Currently the patient is minimally responsive/lethargic, though appears to be arousable. Pt noted to have pus-like material draining from urethral meatus around foley catheter insertion, which is likely leading to sepsis. Our service was consulted acutely replacement evaluate dialysis catheter, as patient is noted to be in acute renal failure with oliguria. The patient has been started on broad-spectrum antibiotic coverage. Case was discussed with urology, nephrology, neurology hospitalist physician. Given presence of hydronephrosis seen on ultrasound, CT is recommended for better evaluation. Given lack of CT availability today, and lack of availability of  CT-guided nephrostomy tube placement if needed, the patient will be transferred [facility] for further management. We did attempt a right IJ and right femoral dialysis catheter placements, which were unsuccessful, we then placed a left femoral dialysis catheter successfully. Per radiology report, there is a small apical pneumothorax on the left side (opposite side of the central line placement attempt). On my review of the film. This is barely visible at this time, uncertain if this truly represents a pneumothorax. If it does, it is small enough that it could be managed conservatively with repeat imaging  -Wells Guiles, M.D.  09/05/2015  Critical Care Attestation.  I have personally obtained a history, examined the patient, evaluated laboratory and imaging results, formulated the assessment and plan and placed orders. The Patient requires high complexity decision making for assessment and support, frequent evaluation and titration of therapies, application of advanced monitoring technologies and extensive interpretation of multiple databases. The patient has critical illness that could lead imminently to failure of 1 or more organ systems and requires the highest level of physician preparedness to intervene.  Critical Care Time devoted to patient care services described in this note is 50 minutes and is exclusive of time spent in procedures.

## 2015-09-05 NOTE — Consult Note (Signed)
Urology Consult  I have been asked to see the patient by Dr. Cherylann Ratel, for evaluation and management of renal failure, left hydronephrosis.  Chief Complaint: AMS  History of Present Illness: Cory Salazar is a 55 y.o. year old male with multiple medical issues including history of brain surgery, dementia, diabetes who was admitted on 09/04/2015 with fever, hypoxia, renal failure to the ICU in setting of sepsis (lactate 2.9 --> 1.1).  Consulted by nephrology today given worsening renal function, baseline creatinine 0.65 now up to 9.08 with worsening uremia.  He has been all uric overnight with only 255 cc of urine output over the past 24 hours.  Mr.Ruzich did have a positive UA upon admission and the leukocytosis to 17.3, repeat today 15, fever, tachycardia, and hypotension not on vasopressor agents currently.  Renal ultrasound yesterday on admission does show mild left hydronephrosis of unclear etiology.  Patient is unable to provide any additional history today. His next of kin has not been able to be contacted despite numerous attempts.    Currently, he has not had a fever since yesterday at 10 PM at which time it was 102. He is mildly hypotensive but maintaining his MAPS and no longer tachycardic.    Past Medical History:  Diagnosis Date  . Anxiety disorder   . Diabetes mellitus without complication (HCC)   . Encephalopathy   . MDD (major depressive disorder) (HCC)   . Seizure [facility])     History reviewed. No pertinent surgical history.  Home Medications:    Medication List    ASK your doctor about these medications   acetaminophen 325 MG tablet Commonly known as:  TYLENOL Take 650 mg by mouth every 4 (four) hours as needed for mild pain, moderate pain or fever.   docusate sodium 100 MG capsule Commonly known as:  COLACE Take 100 mg by mouth 2 (two) times daily.   insulin glargine 100 unit/mL Sopn Commonly known as:  LANTUS Inject 20 Units into the skin at bedtime.     ipratropium-albuterol 0.5-2.5 (3) MG/3ML Soln Commonly known as:  DUONEB Take 3 mLs by nebulization every 6 (six) hours. X 3 days   lactulose 10 GM/15ML solution Commonly known as:  CHRONULAC Take 20 g by mouth 2 (two) times daily.   lamoTRIgine 100 MG tablet Commonly known as:  LAMICTAL Take 100 mg by mouth 2 (two) times daily.   levETIRAcetam 100 MG/ML solution Commonly known as:  KEPPRA Take 5 mLs (500 mg total) by mouth 2 (two) times daily.   mirtazapine 15 MG tablet Commonly known as:  REMERON Take 15 mg by mouth at bedtime. **Medication on hold from 09/01/15 to 09/04/15 per MD at nursing home**   NOVOLOG PENFILL cartridge Generic drug:  insulin aspart Inject 7 Units into the skin 3 (three) times daily with meals.   phenytoin 50 MG tablet Commonly known as:  DILANTIN Chew 6 tablets (300 mg total) by mouth at bedtime.   potassium chloride SA 20 MEQ tablet Commonly known as:  K-DUR,KLOR-CON Take 20 mEq by mouth 2 (two) times daily.   QUEtiapine 200 MG tablet Commonly known as:  SEROQUEL Take 200 mg by mouth 2 (two) times daily. **Medication on hold from 09/01/15 to 09/04/15 per MD at [facility]**   ROCEPHIN 1 g injection Generic drug:  cefTRIAXone Inject 1 g into the muscle daily. For 7 days.   sertraline 50 MG tablet Commonly known as:  ZOLOFT Take 50 mg by mouth daily.  tamsulosin 0.4 MG Caps capsule Commonly known as:  FLOMAX Take 0.4 mg by mouth daily.       Allergies: No Known Allergies  History reviewed. No pertinent family history.  Social History:  has an unknown smoking status. He has never used smokeless tobacco. His alcohol and drug histories are not on file.  ROS: Unable to complete review of systems given the patient's current mental status/ critical illness.  Physical Exam:  Vital signs in last 24 hours: Temp:  [96.9 F (36.1 C)-101.7 F (38.7 C)] 98.1 F (36.7 C) (07/24 0800) Pulse Rate:  [84-140] 84 (07/24 0800) Resp:   [11-31] 14 (07/24 0800) BP: (84-116)/(61-85) 86/61 (07/24 0800) SpO2:  [90 %-100 %] 96 % (07/24 0816) Weight:  [146 lb 6.2 oz (66.4 kg)] 146 lb 6.2 oz (66.4 kg) (07/24 0500) Constitutional:  Alert, no oriented.   HEENT: [place] AT, moist mucus membranes.  Trachea midline, no masses Cardiovascular: Regular rate and rhythm, no clubbing, cyanosis, or edema. Respiratory: Normal respiratory effort. GI: Abdomen is soft, nontender, nondistended, no abdominal masses GU: Foley in place.   Skin: No rashes, bruises or suspicious lesions Exam somewhat limited today as the patient is currently undergoing placement for dialysis catheter   Laboratory Data:   Recent Labs  09/04/15 1102 09/05/15 0059  WBC 17.3* 15.1*  HGB 12.5* 10.7*  HCT 38.5* 32.6*    Recent Labs  09/04/15 1102 09/04/15 1418 09/05/15 0059  NA 146* 148* 146*  K 4.7 4.1 4.2  CL 112* 119* 117*  CO2 18* 15* 16*  GLUCOSE 344* 280* 270*  BUN 122* 108* 117*  CREATININE 9.15* 8.66* 9.08*  CALCIUM 9.4 8.4* 8.5*   No results for input(s): LABPT, INR in the last 72 hours. No results for input(s): LABURIN in the last 72 hours. Results for orders placed or performed during the hospital encounter of 09/04/15  Blood Culture (routine x 2)     Status: None (Preliminary result)   Collection Time: 09/04/15 11:00 AM  Result Value Ref Range Status   Specimen Description BLOOD RIGHT ARM  Final   Special Requests BOTTLES DRAWN AEROBIC AND ANAEROBIC 5CC  Final   Culture NO GROWTH < 24 HOURS  Final   Report Status PENDING  Incomplete  Blood Culture (routine x 2)     Status: None (Preliminary result)   Collection Time: 09/04/15 11:05 AM  Result Value Ref Range Status   Specimen Description BLOOD LEFT ARM  Final   Special Requests BOTTLES DRAWN AEROBIC AND ANAEROBIC 5CC  Final   Culture NO GROWTH < 24 HOURS  Final   Report Status PENDING  Incomplete  MRSA PCR Screening     Status: None   Collection Time: 09/04/15  2:19 PM  Result Value  Ref Range Status   MRSA by PCR NEGATIVE NEGATIVE Final    Comment:        The GeneXpert MRSA Assay (FDA approved for NASAL specimens only), is one component of a comprehensive MRSA colonization surveillance program. It is not intended to diagnose MRSA infection nor to guide or monitor treatment for MRSA infections.      Radiologic Imaging: US Renal  Result Date: 09/04/2015 CLINICAL DATA:  Acute renal failure EXAM: RENAL / URINARY TRACT ULTRASOUND COMPLETE COMPARISON:  None. FINDINGS: Right Kidney: Length: 12.7 cm.  Slight increased cortical echogenicity Left Kidney: Length: 12.9 cm.  Mild hydronephrosis Bladder: The bladder is decompressed with a Foley catheter IMPRESSION: 1. Mild hydronephrosis on the left, age indeterminate. If  there is concern for acute obstruction, CT scan may better evaluate. Electronically Signed   By: Gerome Sam III M.D   On: 09/04/2015 13:50  Portable Chest 1 View  Result Date: 09/05/2015 CLINICAL DATA:  Pneumonia EXAM: PORTABLE CHEST 1 VIEW COMPARISON:  September 04, 2015 FINDINGS: There is patchy airspace consolidation in the right base. There is new mild atelectatic change in the left base, possibly represent a second focus of early pneumonia. Lungs elsewhere clear. Heart is upper normal in size with pulmonary vascularity within normal limits. No adenopathy. No bone lesions. IMPRESSION: Patchy airspace opacity in the right apex consistent with a degree of pneumonia. Atelectasis is in the left lower lobe, new ; question a second focus of pneumonia developing in the left base. Lungs elsewhere clear. Stable cardiac silhouette. Electronically Signed   By: Bretta Bang III M.D.   On: 09/05/2015 07:15  Dg Chest Port 1 View  Result Date: 09/04/2015 CLINICAL DATA:  Fever, probable sepsis, diabetic. Pt unable to give hx at this time EXAM: PORTABLE CHEST 1 VIEW COMPARISON:  07/10/2015 FINDINGS: Midline trachea. Normal heart size. Numerous leads and wires project over  the chest. No pleural effusion or pneumothorax. Mild medial right lung base airspace disease is suspected. Clear left lung. IMPRESSION: Subtle medial right lung airspace disease. Although this could represent atelectasis, given the clinical history, suspicious for early infection. If possible, PA and lateral radiographs should be considered. Electronically Signed   By: Jeronimo Greaves M.D.   On: 09/04/2015 11:30  RUS personally reviewed today  Impression/Assessment:  Critically ill 55 year old male with severe acute kidney injury, left hydronephrosis, urinary tract infection admitted to the ICU with a sepsis-like picture. He is currently on IV antibiotics with tachycardia improving with fluid resuscitation but persistent and worsening renal failure.  In the setting of urinary obstruction, UTI, and sepsis, I would recommend urgent intervention with either percutaneous nephrostomy tube or ureteral stent. Ideally, the patient could undergo a noncontrast CT scan to further elucidate the etiology of his obstruction.  Due to facility issues, the patient is unable to obtain a stat CT scan here today or undergo any emergent intervention in the OR or interventional radiology. As such, I have recommended urgent transfer the patient in order to obtain the care needed at this time.  Plan:  -Case discussed with Dr. Amado Coe, Dr. Cherylann Ratel, and ICU staff who agree with urgent transfer -I have also discussed this case today with Dr. Isabel Caprice who is on-call at [facility] who will continue to facilitate urological intervention is needed for this patient -Continue supportive care, IV abx, Foley at this time     09/05/2015, 11:24 AM  Vanna Scotland,  MD

## 2015-09-05 NOTE — Progress Notes (Signed)
[facility]  ROUNDING NOTE   Subjective:  Renal function worse today. BUN went up to 117 and creatinine up to 9.08. Urine output was only 255 cc over the preceding 24 hours. Attempted to contact family listed in EMR however no one answered.   Objective:  Vital signs in last 24 hours:  Temp:  [96.9 F (36.1 C)-102.1 F (38.9 C)] 98.1 F (36.7 C) (07/24 0800) Pulse Rate:  [84-140] 84 (07/24 0800) Resp:  [11-31] 14 (07/24 0800) BP: (84-116)/(61-85) 86/61 (07/24 0800) SpO2:  [83 %-100 %] 96 % (07/24 0816) Weight:  [65.9 kg (145 lb 4.8 oz)-66.4 kg (146 lb 6.2 oz)] 66.4 kg (146 lb 6.2 oz) (07/24 0500)  Weight change:  Filed Weights   09/04/15 1117 09/05/15 0500  Weight: 65.9 kg (145 lb 4.8 oz) 66.4 kg (146 lb 6.2 oz)    Intake/Output: I/O last 3 completed shifts: In: 2419.6 [I.V.:2264.6; IV Piggyback:155] Out: 255 [Urine:255]   Intake/Output this shift:  Total I/O In: 125 [I.V.:125] Out: 175 [Urine:175]  Physical Exam: General: Critically ill appearing  Head: Normocephalic, atraumatic. Moist oral mucosal membranes  Eyes: Anicteric  Neck: Supple, trachea midline  Lungs:  Clear to auscultation normal effort  Heart: S1S2 no rubs  Abdomen:  Soft, nontender, BS present  Extremities: no peripheral edema.  Neurologic: Awake, not following commands  Skin: No lesions       Basic Metabolic Panel:  Recent Labs Lab 09/04/15 1102 09/04/15 1418 09/05/15 0059  NA 146* 148* 146*  K 4.7 4.1 4.2  CL 112* 119* 117*  CO2 18* 15* 16*  GLUCOSE 344* 280* 270*  BUN 122* 108* 117*  CREATININE 9.15* 8.66* 9.08*  CALCIUM 9.4 8.4* 8.5*    Liver Function Tests:  Recent Labs Lab 09/04/15 1102 09/05/15 0059  AST 30 24  ALT 20 17  ALKPHOS 175* 120  BILITOT 3.1* 3.5*  PROT 8.0 6.2*  ALBUMIN 2.8* 2.2*   No results for input(s): LIPASE, AMYLASE in the last 168 hours. No results for input(s): AMMONIA in the last 168 hours.  CBC:  Recent Labs Lab  09/04/15 1102 09/05/15 0059  WBC 17.3* 15.1*  NEUTROABS 15.7*  --   HGB 12.5* 10.7*  HCT 38.5* 32.6*  MCV 78.1* 78.1*  PLT 147* 82*    Cardiac Enzymes: No results for input(s): CKTOTAL, CKMB, CKMBINDEX, TROPONINI in the last 168 hours.  BNP: Invalid input(s): POCBNP  CBG:  Recent Labs Lab 09/04/15 1544 09/04/15 2024 09/04/15 2337 09/05/15 0345 09/05/15 0737  GLUCAP 240* 261* 269* 209* 201*    Microbiology: Results for orders placed or performed during the hospital encounter of 09/04/15  MRSA PCR Screening     Status: None   Collection Time: 09/04/15  2:19 PM  Result Value Ref Range Status   MRSA by PCR NEGATIVE NEGATIVE Final    Comment:        The GeneXpert MRSA Assay (FDA approved for NASAL specimens only), is one component of a comprehensive MRSA colonization surveillance program. It is not intended to diagnose MRSA infection nor to guide or monitor treatment for MRSA infections.     Coagulation Studies: No results for input(s): LABPROT, INR in the last 72 hours.  Urinalysis:  Recent Labs  09/04/15 1125  COLORURINE AMBER*  LABSPEC 1.014  PHURINE 5.0  GLUCOSEU 150*  HGBUR 2+*  BILIRUBINUR NEGATIVE  KETONESUR NEGATIVE  PROTEINUR 100*  NITRITE NEGATIVE  LEUKOCYTESUR 3+*      Imaging: US Renal  Result Date:  09/04/2015 CLINICAL DATA:  Acute renal failure EXAM: RENAL / URINARY TRACT ULTRASOUND COMPLETE COMPARISON:  None. FINDINGS: Right Kidney: Length: 12.7 cm.  Slight increased cortical echogenicity Left Kidney: Length: 12.9 cm.  Mild hydronephrosis Bladder: The bladder is decompressed with a Foley catheter IMPRESSION: 1. Mild hydronephrosis on the left, age indeterminate. If there is concern for acute obstruction, CT scan may better evaluate. Electronically Signed   By: Gerome Sam III M.D   On: 09/04/2015 13:50  Portable Chest 1 View  Result Date: 09/05/2015 CLINICAL DATA:  Pneumonia EXAM: PORTABLE CHEST 1 VIEW COMPARISON:  September 04, 2015  FINDINGS: There is patchy airspace consolidation in the right base. There is new mild atelectatic change in the left base, possibly represent a second focus of early pneumonia. Lungs elsewhere clear. Heart is upper normal in size with pulmonary vascularity within normal limits. No adenopathy. No bone lesions. IMPRESSION: Patchy airspace opacity in the right apex consistent with a degree of pneumonia. Atelectasis is in the left lower lobe, new ; question a second focus of pneumonia developing in the left base. Lungs elsewhere clear. Stable cardiac silhouette. Electronically Signed   By: Bretta Bang III M.D.   On: 09/05/2015 07:15  Dg Chest Port 1 View  Result Date: 09/04/2015 CLINICAL DATA:  Fever, probable sepsis, diabetic. Pt unable to give hx at this time EXAM: PORTABLE CHEST 1 VIEW COMPARISON:  07/10/2015 FINDINGS: Midline trachea. Normal heart size. Numerous leads and wires project over the chest. No pleural effusion or pneumothorax. Mild medial right lung base airspace disease is suspected. Clear left lung. IMPRESSION: Subtle medial right lung airspace disease. Although this could represent atelectasis, given the clinical history, suspicious for early infection. If possible, PA and lateral radiographs should be considered. Electronically Signed   By: Jeronimo Greaves M.D.   On: 09/04/2015 11:30    Medications:   . sodium chloride 125 mL/hr at 09/05/15 0700   . antiseptic oral rinse  7 mL Mouth Rinse q12n4p  . chlorhexidine  15 mL Mouth Rinse BID  . docusate sodium  100 mg Oral BID  . famotidine (PEPCID) IV  20 mg Intravenous Daily  . heparin  5,000 Units Subcutaneous Q8H  . insulin aspart  0-9 Units Subcutaneous Q4H  . insulin glargine  20 Units Subcutaneous QHS  . ipratropium-albuterol  3 mL Nebulization QID  . lactulose  20 g Oral BID  . lamoTRIgine  100 mg Oral BID  . levETIRAcetam  500 mg Intravenous Q12H  . methylPREDNISolone (SOLU-MEDROL) injection  60 mg Intravenous Q12H  .  mirtazapine  15 mg Oral QHS  . piperacillin-tazobactam (ZOSYN)  IV  3.375 g Intravenous Q12H  . sertraline  50 mg Oral Daily  . sodium chloride flush  3 mL Intravenous Q12H  . tamsulosin  0.4 mg Oral Daily   acetaminophen **OR** acetaminophen, bisacodyl, LORazepam, morphine injection, ondansetron **OR** ondansetron (ZOFRAN) IV  Assessment/ Plan:  55 y.o. male with medical problems of anxiety, depression, brain tumor surgery, h/o dementia, Diabetes, insulin dependent, h/o Seizures, resident of [facility] who was admitted to [facility] on 09/04/2015 for evaluation of fever, hypoxia.    1. ARF, likely severe dehydration leading to ATN. Baseline normal.  Mild left sided hydronephrosis. 2. Acidosis, lactic  3. Hyperglycemia 4. Possible rt lung pneumonia 5. UTI 6. Hypernatremia  Plan:  The patient appears to be critically ill. BUN is currently 117 with a creatinine of 9.08 and urine output was only 255 cc over the preceding 24  hours. Therefore we would recommend a trial of continuous renal replacement therapy at this point in time. Continue half-normal saline for hydration as serum sodium is still high. The patient was also found to have mild left-sided hydronephrosis for which we will obtain urology consultation. He may end up needing an additional imaging study.   LOS: 1 Cory Salazar 7/24/20179:27 AM

## 2015-09-05 NOTE — Progress Notes (Signed)
Per Annabelle Harman NP and Dr. Jeralyn Ruths trialysis catheter available to be used. Chest x-ray ordered due to attempt in IJ.

## 2015-09-05 NOTE — Discharge Summary (Signed)
[facility] - [place] at [facility]   PATIENT NAME: Cory Salazar    MR#:  [id number]  DATE OF BIRTH:  March 17, 1960  DATE OF ADMISSION:  09/04/2015 ADMITTING PHYSICIAN: Marguarite Arbour, MD  DATE OF DISCHARGE: 09/05/15 PRIMARY CARE PHYSICIAN: No primary care provider on file.    ADMISSION DIAGNOSIS:  ARF (acute renal failure) (HCC) [N17.9] Sepsis, due to unspecified organism (HCC) [A41.9] Acute renal failure, unspecified acute renal failure type (HCC) [N17.9] Aspiration pneumonia of right lung, unspecified aspiration pneumonia type, unspecified part of lung (HCC) [J69.0]  DISCHARGE DIAGNOSIS:  Principal Problem:   SIRS (systemic inflammatory response syndrome) (HCC) Active Problems:   HCAP (healthcare-associated pneumonia)   ARF (acute renal failure) (HCC)   Seizure disorder (HCC)   Pressure ulcer Hydronephrosis  SECONDARY DIAGNOSIS:   Past Medical History:  Diagnosis Date  . Anxiety disorder   . Diabetes mellitus without complication (HCC)   . Encephalopathy   . MDD (major depressive disorder) (HCC)   . Seizure [facility])     HOSPITAL COURSE:    1. Sepsis secondary to healthcare associated pneumonia with possible left apical pneumothorax per x-ray and acute cystitis/left urinary obstruction with the left sided hydronephrosis Follow-up on the cultures. Continue IV fluids and antibiotics broad-spectrum  2. Acute kidney injury-post renal with left  hydronephrosis Urology Dr. Apolinar Junes has recommended to transfer the patient to [facility] for nephrostomy tube placement as soon as possible as patient is septic and could not do surgeries at [facility] from technical problems today. Patient will be transferred to [facility] hospitalist servicedr.[place]. Dr. Apolinar Junes has discussed with nephrology Dr. Manon Hilding at [facility] Continue IV fluids and antibiotics  3. History of brain tumor status post resection with intermittent  seizures Patient is a poor historian at baseline. Unable to reach any family members during the hospital course  4. Diabetes mellitus sliding scale insulin   DISCHARGE CONDITIONS:  Critical  CONSULTS OBTAINED:  Treatment Team:  Mosetta Pigeon, MD Kym Groom, MD Vanna Scotland, MD   PROCEDURES Central line placement in the left groin  DRUG ALLERGIES:  No Known Allergies  DISCHARGE MEDICATIONS:   Current Discharge Medication List    START taking these medications   Details  insulin glargine (LANTUS) 100 UNIT/ML injection Inject 0.2 mLs (20 Units total) into the skin at bedtime. Qty: 10 mL, Refills: 11    levETIRAcetam 500 mg in sodium chloride 0.9 % 100 mL Inject 500 mg into the vein every 12 (twelve) hours.    LORazepam (ATIVAN) 2 MG/ML injection Inject 0.5 mLs (1 mg total) into the vein every 4 (four) hours as needed for seizure. Qty: 1 mL, Refills: 0    piperacillin-tazobactam (ZOSYN) 3.375 GM/50ML IVPB Inject 50 mLs (3.375 g total) into the vein every 8 (eight) hours. Qty: 50 mL    sodium chloride 0.45 % solution Inject 100 mLs into the vein continuous. Qty: 1000 mL, Refills: 0    sodium chloride flush (NS) 0.9 % SOLN Inject 3 mLs into the vein every 12 (twelve) hours.      CONTINUE these medications which have CHANGED   Details  ipratropium-albuterol (DUONEB) 0.5-2.5 (3) MG/3ML SOLN Take 3 mLs by nebulization 4 (four) times daily. Qty: 360 mL    lactulose (CHRONULAC) 10 GM/15ML solution Take 30 mLs (20 g total) by mouth 2 (two) times daily. Qty: 240 mL, Refills: 0    lamoTRIgine (LAMICTAL) 100 MG tablet Take 1 tablet (100 mg total) by mouth 2 (  two) times daily.    mirtazapine (REMERON) 15 MG tablet Take 1 tablet (15 mg total) by mouth at bedtime.      CONTINUE these medications which have NOT CHANGED   Details  acetaminophen (TYLENOL) 325 MG tablet Take 650 mg by mouth every 4 (four) hours as needed for mild pain, moderate pain or fever.     phenytoin (DILANTIN) 50 MG tablet Chew 6 tablets (300 mg total) by mouth at bedtime. Qty: 90 tablet, Refills: 1    potassium chloride SA (K-DUR,KLOR-CON) 20 MEQ tablet Take 20 mEq by mouth 2 (two) times daily.    QUEtiapine (SEROQUEL) 200 MG tablet Take 200 mg by mouth 2 (two) times daily. **Medication on hold from 09/01/15 to 09/04/15 per MD at [facility]**    sertraline (ZOLOFT) 50 MG tablet Take 50 mg by mouth daily.    tamsulosin (FLOMAX) 0.4 MG CAPS capsule Take 0.4 mg by mouth daily.      STOP taking these medications     cefTRIAXone (ROCEPHIN) 1 g injection      docusate sodium (COLACE) 100 MG capsule      insulin aspart (NOVOLOG PENFILL) cartridge      insulin glargine (LANTUS) 100 unit/mL SOPN      levETIRAcetam (KEPPRA) 100 MG/ML solution          DISCHARGE INSTRUCTIONS:    Nothing by mouth  DIET:  {npo DISCHARGE CONDITION:  Serious  ACTIVITY:  Bedrest  OXYGEN:  Home Oxygen: No.   Oxygen Delivery: room air  DISCHARGE LOCATION:  [facility] trial hospitalist service   If you experience worsening of your admission symptoms, develop shortness of breath, life threatening emergency, suicidal or homicidal thoughts you must seek medical attention immediately by calling 911 or calling your MD immediately  if symptoms less severe.  You Must read complete instructions/literature along with all the possible adverse reactions/side effects for all the Medicines you take and that have been prescribed to you. Take any new Medicines after you have completely understood and accpet all the possible adverse reactions/side effects.   Please note  You were cared for by a hospitalist during your hospital stay. If you have any questions about your discharge medications or the care you received while you were in the hospital after you are discharged, you can call the unit and asked to speak with the hospitalist on call if the hospitalist that took care of  you is not available. Once you are discharged, your primary care physician will handle any further medical issues. Please note that NO REFILLS for any discharge medications will be authorized once you are discharged, as it is imperative that you return to your primary care physician (or establish a relationship with a primary care physician if you do not have one) for your aftercare needs so that they can reassess your need for medications and monitor your lab values.     Today  Chief Complaint  Patient presents with  . Code Sepsis   Patient is resting comfortably. Poor historian. Unable to get any history from him given the history of brain tumor and resection in the past  ROS: Unobtainable as the patient is a poor historian with encephalopathy chronic   VITAL SIGNS:  Blood pressure 94/68, pulse 94, temperature 99 F (37.2 C), temperature source Rectal, resp. rate 19, height 6\' 1"  (1.854 m), weight 66.4 kg (146 lb 6.2 oz), SpO2 93 %.  I/O:    Intake/Output Summary (Last 24 hours) at  09/05/15 1359 Last data filed at 09/05/15 1318  Gross per 24 hour  Intake          2999.58 ml  Output              855 ml  Net          2144.58 ml    PHYSICAL EXAMINATION:  GENERAL:  55 y.o.-year-old patient lying in the bed with no acute distress.  EYES: Pupils equal, round, reactive to light and accommodation. No scleral icterus.  HEENT: Head atraumatic, normocephalic. Oropharynx and nasopharynx clear.  NECK:  Supple, no jugular venous distention. No thyroid enlargement, no tenderness.  LUNGS: Normal breath sounds bilaterally, no wheezing, rales,rhonchi or crepitation. No use of accessory muscles of respiration.  CARDIOVASCULAR: S1, S2 normal. No murmurs, rubs, or gallops.  ABDOMEN: Soft, non-tender, non-distended. Bowel sounds present. No organomegaly or mass.  EXTREMITIES: No pedal edema, cyanosis, or clubbing.  NEUROLOGIC: Awake and alert but disoriented  PSYCHIATRIC: The patient is alert.   SKIN: No obvious rash, lesion, or ulcer.   DATA REVIEW:   CBC  Recent Labs Lab 09/05/15 0059  WBC 15.1*  HGB 10.7*  HCT 32.6*  PLT 82*    Chemistries   Recent Labs Lab 09/05/15 0059 09/05/15 1006  NA 146* 143  K 4.2 4.1  CL 117* 115*  CO2 16* 13*  GLUCOSE 270* 228*  BUN 117* 126*  CREATININE 9.08* 9.57*  CALCIUM 8.5* 8.9  AST 24  --   ALT 17  --   ALKPHOS 120  --   BILITOT 3.5*  --     Cardiac Enzymes No results for input(s): TROPONINI in the last 168 hours.  Microbiology Results  Results for orders placed or performed during the hospital encounter of 09/04/15  Blood Culture (routine x 2)     Status: None (Preliminary result)   Collection Time: 09/04/15 11:00 AM  Result Value Ref Range Status   Specimen Description BLOOD RIGHT ARM  Final   Special Requests BOTTLES DRAWN AEROBIC AND ANAEROBIC 5CC  Final   Culture NO GROWTH < 24 HOURS  Final   Report Status PENDING  Incomplete  Blood Culture (routine x 2)     Status: None (Preliminary result)   Collection Time: 09/04/15 11:05 AM  Result Value Ref Range Status   Specimen Description BLOOD LEFT ARM  Final   Special Requests BOTTLES DRAWN AEROBIC AND ANAEROBIC 5CC  Final   Culture NO GROWTH < 24 HOURS  Final   Report Status PENDING  Incomplete  MRSA PCR Screening     Status: None   Collection Time: 09/04/15  2:19 PM  Result Value Ref Range Status   MRSA by PCR NEGATIVE NEGATIVE Final    Comment:        The GeneXpert MRSA Assay (FDA approved for NASAL specimens only), is one component of a comprehensive MRSA colonization surveillance program. It is not intended to diagnose MRSA infection nor to guide or monitor treatment for MRSA infections.   C difficile quick scan w PCR reflex     Status: None   Collection Time: 09/05/15 10:11 AM  Result Value Ref Range Status   C Diff antigen NEGATIVE NEGATIVE Final   C Diff toxin NEGATIVE NEGATIVE Final   C Diff interpretation No C. difficile detected.  Final   Gastrointestinal Panel by PCR , Stool     Status: None   Collection Time: 09/05/15 10:11 AM  Result Value Ref Range Status   Campylobacter  species NOT DETECTED NOT DETECTED Final   Plesimonas shigelloides NOT DETECTED NOT DETECTED Final   Salmonella species NOT DETECTED NOT DETECTED Final   Yersinia enterocolitica NOT DETECTED NOT DETECTED Final   Vibrio species NOT DETECTED NOT DETECTED Final   Vibrio cholerae NOT DETECTED NOT DETECTED Final   Enteroaggregative E coli (EAEC) NOT DETECTED NOT DETECTED Final   Enteropathogenic E coli (EPEC) NOT DETECTED NOT DETECTED Final   Enterotoxigenic E coli (ETEC) NOT DETECTED NOT DETECTED Final   Shiga like toxin producing E coli (STEC) NOT DETECTED NOT DETECTED Final   E. coli O157 NOT DETECTED NOT DETECTED Final   Shigella/Enteroinvasive E coli (EIEC) NOT DETECTED NOT DETECTED Final   Cryptosporidium NOT DETECTED NOT DETECTED Final   Cyclospora cayetanensis NOT DETECTED NOT DETECTED Final   Entamoeba histolytica NOT DETECTED NOT DETECTED Final   Giardia lamblia NOT DETECTED NOT DETECTED Final   Adenovirus F40/41 NOT DETECTED NOT DETECTED Final   Astrovirus NOT DETECTED NOT DETECTED Final   Norovirus GI/GII NOT DETECTED NOT DETECTED Final   Rotavirus A NOT DETECTED NOT DETECTED Final   Sapovirus (I, II, IV, and V) NOT DETECTED NOT DETECTED Final    RADIOLOGY:  Dg Chest 1 View  Result Date: 09/05/2015 CLINICAL DATA:  Unsuccessful central line placement. EXAM: CHEST 1 VIEW COMPARISON:  Radiograph of same day. FINDINGS: The heart size and mediastinal contours are within normal limits. No pleural effusion is noted. Right lung is clear. Possible pleural line is seen in left lung apex which may represent small apical pneumothorax. The visualized skeletal structures are unremarkable. IMPRESSION: Possible small left apical pneumothorax. Follow-up radiographs are recommended. Critical Value/emergent results were called by telephone at the time of  interpretation on 09/05/2015 at 1:34 pm to Bennett Scrape, nurse practitioner, who verbally acknowledged these results and will immediately contact Dr. Nicholos Johns. Electronically Signed   By: Lupita Raider, M.D.   On: 09/05/2015 13:36  US Renal  Result Date: 09/04/2015 CLINICAL DATA:  Acute renal failure EXAM: RENAL / URINARY TRACT ULTRASOUND COMPLETE COMPARISON:  None. FINDINGS: Right Kidney: Length: 12.7 cm.  Slight increased cortical echogenicity Left Kidney: Length: 12.9 cm.  Mild hydronephrosis Bladder: The bladder is decompressed with a Foley catheter IMPRESSION: 1. Mild hydronephrosis on the left, age indeterminate. If there is concern for acute obstruction, CT scan may better evaluate. Electronically Signed   By: Gerome Sam III M.D   On: 09/04/2015 13:50  Portable Chest 1 View  Result Date: 09/05/2015 CLINICAL DATA:  Pneumonia EXAM: PORTABLE CHEST 1 VIEW COMPARISON:  September 04, 2015 FINDINGS: There is patchy airspace consolidation in the right base. There is new mild atelectatic change in the left base, possibly represent a second focus of early pneumonia. Lungs elsewhere clear. Heart is upper normal in size with pulmonary vascularity within normal limits. No adenopathy. No bone lesions. IMPRESSION: Patchy airspace opacity in the right apex consistent with a degree of pneumonia. Atelectasis is in the left lower lobe, new ; question a second focus of pneumonia developing in the left base. Lungs elsewhere clear. Stable cardiac silhouette. Electronically Signed   By: Bretta Bang III M.D.   On: 09/05/2015 07:15  Dg Chest Port 1 View  Result Date: 09/04/2015 CLINICAL DATA:  Fever, probable sepsis, diabetic. Pt unable to give hx at this time EXAM: PORTABLE CHEST 1 VIEW COMPARISON:  07/10/2015 FINDINGS: Midline trachea. Normal heart size. Numerous leads and wires project over the chest. No pleural effusion or pneumothorax. Mild medial right  lung base airspace disease is suspected. Clear  left lung. IMPRESSION: Subtle medial right lung airspace disease. Although this could represent atelectasis, given the clinical history, suspicious for early infection. If possible, PA and lateral radiographs should be considered. Electronically Signed   By: Jeronimo Greaves M.D.   On: 09/04/2015 11:30   EKG:   Orders placed or performed during the hospital encounter of 09/04/15  . ED EKG 12-Lead  . ED EKG 12-Lead      Management plans discussed with the patient, family and they are in agreement.  CODE STATUS:     Code Status Orders        Start     Ordered   09/04/15 1425  Full code  Continuous     09/04/15 1424    Code Status History    Date Active Date Inactive Code Status Order ID Comments User Context   This patient has a current code status but no historical code status.      TOTAL Critical care TIME TAKING CARE OF THIS PATIENT: 75  minutes.   Note: This dictation was prepared with Dragon dictation along with smaller phrase technology. Any transcriptional errors that result from this process are unintentional.   @[facility] @  on 09/05/2015 at 1:59 PM  Between 7am to 6pm - Pager - [phone]  After 6pm go to [email or web address] - password [email or web address] [facility]  [facility]  Office  [phone]  CC: Primary care physician; No primary care provider on file.

## 2015-09-05 NOTE — Procedures (Signed)
Central Venous HD Catheter Placement: Indication: Patient receiving vesicant or irritant drug.; Patient receiving intravenous therapy for longer than 5 days.; Patient has limited or no vascular access.   Consent:emergent  Risks and benefits explained in detail including risk of infection, bleeding, respiratory failure and death..   Hand washing performed prior to starting the procedure.   Procedure: An active timeout was performed and correct patient, name, & ID confirmed.  After explaining risk and benefits, patient was positioned correctly for central venous access. Patient was prepped using strict sterile technique including chlorohexadine preps, sterile drape, sterile gown and sterile gloves.  The area was prepped, draped and anesthetized in the usual sterile manner. Patient comfort was obtained.  A triple lumen catheter placement was attempted in the left Internal Jugular Vein, which would not be cannulated as the vessel was small and collapsible with respiration. Therefore we switched to right femoral area, this vessel was small, and could not be cannulated. There was a small hematoma formation after 1 poke here, therefore we moved to the left femoral vein.  There was good blood return, catheter caps were placed on lumens, catheter flushed easily, the line was secured and a sterile dressing and BIO-PATCH applied.   Ultrasound was used to visualize vasculature and guidance of needle.   Number of Attempts:3 Complications: right femoral hematoma.  Estimated Blood Loss: 30 cc Chest Radiograph indicated and ordered.   Operator: Sonda Rumble ACNP supervised by Wells Guiles, M.D, who was present for the duration of the procedure.   Wells Guiles, M.D.

## 2015-09-05 NOTE — Progress Notes (Signed)
Pharmacy Antibiotic Note  Cory Salazar is a 55 y.o. male admitted on 09/04/2015 with sepsis and HCAP.  Pharmacy has been consulted for vancomycin and piperacillin/tazobactam dosing.  Plan: MRSA PCR is negative. AFter discussion with Dr. Nicholos Johns, will d/c vancomycin.   Patient started on CRRT. Will change Zosyn dosing to 3.375 g EI q 8 hours.     Height: 6\' 1"  (185.4 cm) Weight: 146 lb 6.2 oz (66.4 kg) IBW/kg (Calculated) : 79.9  Temp (24hrs), Avg:98.6 F (37 C), Min:96.9 F (36.1 C), Max:101.7 F (38.7 C)   Recent Labs Lab 09/04/15 1102 09/04/15 1105 09/04/15 1418 09/05/15 0059  WBC 17.3*  --   --  15.1*  CREATININE 9.15*  --  8.66* 9.08*  LATICACIDVEN  --  1.9 2.9* 1.1    Estimated Creatinine Clearance: 8.8 mL/min (by C-G formula based on SCr of 9.08 mg/dL).    No Known Allergies  Antimicrobials this admission: Piperacillin/tazobactam 07/23 >> Vancomycin 07/23 >> 7/24   Microbiology results: 7/24 C diff: pending 7/24 GI panel: pending 07/23 BCx: NGTD x 2 07/23 UCx: Pending 07/23 MRSA PCR: negative  Thank you for allowing pharmacy to be a part of this patient's care.  Luisa Hart, PharmD Clinical Pharmacist  09/05/2015 11:10 AM

## 2015-09-05 NOTE — Progress Notes (Signed)
PHARMACY - CRITICAL CARE PROGRESS NOTE  Pharmacy Consult for CRRT medication adjustment   No Known Allergies  Patient Measurements: Height: 6\' 1"  (185.4 cm) Weight: 146 lb 6.2 oz (66.4 kg) IBW/kg (Calculated) : 79.9  Vital Signs: Temp: 98.1 F (36.7 C) (07/24 0800) Temp Source: Rectal (07/24 0800) BP: 86/61 (07/24 0800) Pulse Rate: 84 (07/24 0800) Intake/Output from previous day: 07/23 0701 - 07/24 0700 In: 2419.6 [I.V.:2264.6; IV Piggyback:155] Out: 255 [Urine:255] Intake/Output from this shift: Total I/O In: 125 [I.V.:125] Out: 175 [Urine:175] Vent settings for last 24 hours:    Labs:  Recent Labs  09/04/15 1102 09/04/15 1418 09/05/15 0059  WBC 17.3*  --  15.1*  HGB 12.5*  --  10.7*  HCT 38.5*  --  32.6*  PLT 147*  --  82*  CREATININE 9.15* 8.66* 9.08*  ALBUMIN 2.8*  --  2.2*  PROT 8.0  --  6.2*  AST 30  --  24  ALT 20  --  17  ALKPHOS 175*  --  120  BILITOT 3.1*  --  3.5*   Estimated Creatinine Clearance: 8.8 mL/min (by C-G formula based on SCr of 9.08 mg/dL).   Recent Labs  09/04/15 2337 09/05/15 0345 09/05/15 0737  GLUCAP 269* 209* 201*    Microbiology: Recent Results (from the past 720 hour(s))  Blood Culture (routine x 2)     Status: None (Preliminary result)   Collection Time: 09/04/15 11:00 AM  Result Value Ref Range Status   Specimen Description BLOOD RIGHT ARM  Final   Special Requests BOTTLES DRAWN AEROBIC AND ANAEROBIC 5CC  Final   Culture NO GROWTH < 24 HOURS  Final   Report Status PENDING  Incomplete  Blood Culture (routine x 2)     Status: None (Preliminary result)   Collection Time: 09/04/15 11:05 AM  Result Value Ref Range Status   Specimen Description BLOOD LEFT ARM  Final   Special Requests BOTTLES DRAWN AEROBIC AND ANAEROBIC 5CC  Final   Culture NO GROWTH < 24 HOURS  Final   Report Status PENDING  Incomplete  MRSA PCR Screening     Status: None   Collection Time: 09/04/15  2:19 PM  Result Value Ref Range Status   MRSA  by PCR NEGATIVE NEGATIVE Final    Comment:        The GeneXpert MRSA Assay (FDA approved for NASAL specimens only), is one component of a comprehensive MRSA colonization surveillance program. It is not intended to diagnose MRSA infection nor to guide or monitor treatment for MRSA infections.     Medications:  Scheduled:  . antiseptic oral rinse  7 mL Mouth Rinse q12n4p  . chlorhexidine  15 mL Mouth Rinse BID  . docusate sodium  100 mg Oral BID  . famotidine (PEPCID) IV  20 mg Intravenous Q48H  . heparin  5,000 Units Subcutaneous Q8H  . insulin aspart  0-9 Units Subcutaneous Q4H  . insulin glargine  20 Units Subcutaneous QHS  . ipratropium-albuterol  3 mL Nebulization QID  . lactulose  20 g Oral BID  . lamoTRIgine  100 mg Oral BID  . levETIRAcetam  500 mg Intravenous Q12H  . methylPREDNISolone (SOLU-MEDROL) injection  60 mg Intravenous Q12H  . mirtazapine  15 mg Oral QHS  . piperacillin-tazobactam (ZOSYN)  IV  3.375 g Intravenous Q8H  . sertraline  50 mg Oral Daily  . sodium chloride flush  3 mL Intravenous Q12H  . tamsulosin  0.4 mg Oral Daily  Infusions:  . sodium chloride 125 mL/hr at 09/05/15 0700  . pureflow      Assessment: 55 y/o M admitted with HCAP and ARF to begin CRRT. Pharmacy consulted to adjust medications for CRRT.   Plan:  Will change famotidine dosing to 20 mg iv q 48 hours. Will change Zosyn dosing to 3.375 g EI q 8 hours. No further medication adjustments needed at present.   Pharmacy will continue to follow.   Luisa Hart D 09/05/2015,11:15 AM

## 2015-09-05 NOTE — Clinical Social Work Note (Signed)
CSW consulted by nursing to notify that patient is from [facility]. CSW already has an initial assessment in from 7/23 and is following. York Spaniel MSW,LCSW [phone]

## 2015-09-05 NOTE — H&P (Signed)
History and Physical    Cory Salazar ZOX:[id number] DOB: 06-20-1960 DOA: 09/05/2015  PCP: No primary care provider on file.  Patient coming from: [place]  Chief Complaint: ARF, Sepsis  HPI: Cory Salazar is a 55 y.o. 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 In ER, pt was tachycardic and febrile. WBC=17k. CXR shows right-sided pneumonia. Pt was transitioned to [facility] for further evaluation and recommendations given limitations of care at [place]. Urology is aware and critical care consulted. Patient will be admitted to step down for further care. Unable to obtain history from patient.  Review of Systems: Unable to obtain history from patient given current medical condition.  Past Medical History:  Diagnosis Date  . Anxiety disorder   . Diabetes mellitus without complication (HCC)   . Encephalopathy   . MDD (major depressive disorder) (HCC)   . Seizure [facility])     History reviewed. No pertinent surgical history.   has an unknown smoking status. He has never used smokeless tobacco. He reports that he does not drink alcohol or use drugs.  No Known Allergies  History reviewed. No pertinent family history. Pt unable to comment on this due to his current status   Prior to Admission medications   Medication Sig Start Date End Date Taking? Authorizing Provider  acetaminophen (TYLENOL) 325 MG tablet Take 650 mg by mouth every 4 (four) hours as needed for mild pain, moderate pain or fever.   Yes Historical Provider, MD  insulin glargine (LANTUS) 100 UNIT/ML injection Inject 0.2 mLs (20 Units total) into the skin at bedtime. 09/05/15  Yes Ramonita Lab, MD  ipratropium-albuterol (DUONEB) 0.5-2.5 (3) MG/3ML SOLN Take 3 mLs by nebulization 4 (four) times daily. 09/05/15  Yes Ramonita Lab, MD  lactulose (CHRONULAC) 10 GM/15ML solution Take 30 mLs (20 g total) by mouth 2 (two) times daily. 09/05/15   Yes Ramonita Lab, MD  lamoTRIgine (LAMICTAL) 100 MG tablet Take 1 tablet (100 mg total) by mouth 2 (two) times daily. 09/05/15  Yes Ramonita Lab, MD  levETIRAcetam 500 mg in sodium chloride 0.9 % 100 mL Inject 500 mg into the vein every 12 (twelve) hours. 09/05/15  Yes Ramonita Lab, MD  LORazepam (ATIVAN) 2 MG/ML injection Inject 0.5 mLs (1 mg total) into the vein every 4 (four) hours as needed for seizure. 09/05/15  Yes Ramonita Lab, MD  mirtazapine (REMERON) 15 MG tablet Take 1 tablet (15 mg total) by mouth at bedtime. 09/05/15  Yes Ramonita Lab, MD  phenytoin (DILANTIN) 50 MG tablet Chew 6 tablets (300 mg total) by mouth at bedtime. 07/10/15 07/09/16 Yes Jeanmarie Plant, MD  piperacillin-tazobactam (ZOSYN) 3.375 GM/50ML IVPB Inject 50 mLs (3.375 g total) into the vein every 8 (eight) hours. 09/05/15  Yes Ramonita Lab, MD  potassium chloride SA (K-DUR,KLOR-CON) 20 MEQ tablet Take 20 mEq by mouth 2 (two) times daily.   Yes Historical Provider, MD  QUEtiapine (SEROQUEL) 200 MG tablet Take 200 mg by mouth 2 (two) times daily. **Medication on hold from 09/01/15 to 09/04/15 per MD at [facility]**   Yes Historical Provider, MD  sertraline (ZOLOFT) 50 MG tablet Take 50 mg by mouth daily.   Yes Historical Provider, MD  sodium chloride 0.45 % solution Inject 100 mLs into the vein continuous. 09/05/15  Yes Ramonita Lab, MD  sodium chloride flush (NS) 0.9 % SOLN Inject 3 mLs into the vein every 12 (twelve) hours.  09/05/15  Yes Ramonita Lab, MD  tamsulosin (FLOMAX) 0.4 MG CAPS capsule Take 0.4 mg by mouth daily.   Yes Historical Provider, MD    Physical Exam: Vitals:   09/05/15 1452 09/05/15 1600  BP: (!) 89/61   Resp: 15   Temp: 98.2 F (36.8 C) 98 F (36.7 C)  TempSrc: Rectal Axillary  SpO2: 99%   Weight: 65 kg (143 lb 4.8 oz)     Constitutional: awake and alert, in nad. Vitals:   09/05/15 1452 09/05/15 1600  BP: (!) 89/61   Resp: 15   Temp: 98.2 F (36.8 C) 98 F (36.7 C)  TempSrc: Rectal  Axillary  SpO2: 99%   Weight: 65 kg (143 lb 4.8 oz)    Eyes: PERRL, lids and conjunctivae normal ENMT: Mucous membranes are moist. No exudates on limited exam. Neck: normal, supple, no masses, no thyromegaly Respiratory: equal chest rise, no wheezes, rhales right lung base  Cardiovascular: Regular rate and rhythm, no murmurs / rubs / gallops.  Abdomen: no tenderness, no masses palpated. No hepatosplenomegaly. Bowel sounds positive.  Musculoskeletal: no clubbing / cyanosis.  Skin: no rashes, lesions, ulcers. No induration Neurologic: unable to appropriately assess due to limited cooperation and current medical condition. No facial asymmetry  Psychiatric: unable to appropriately assess due to limited cooperation and current medical condition.   Labs on Admission: I have personally reviewed following labs and imaging studies  CBC:  Recent Labs Lab 09/04/15 1102 09/05/15 0059  WBC 17.3* 15.1*  NEUTROABS 15.7*  --   HGB 12.5* 10.7*  HCT 38.5* 32.6*  MCV 78.1* 78.1*  PLT 147* 82*   Basic Metabolic Panel:  Recent Labs Lab 09/04/15 1102 09/04/15 1418 09/05/15 0059 09/05/15 1006 09/05/15 1317  NA 146* 148* 146* 143 144  K 4.7 4.1 4.2 4.1 4.0  CL 112* 119* 117* 115* 115*  CO2 18* 15* 16* 13* 11*  GLUCOSE 344* 280* 270* 228* 220*  BUN 122* 108* 117* 126* 129*  CREATININE 9.15* 8.66* 9.08* 9.57* 8.94*  CALCIUM 9.4 8.4* 8.5* 8.9 8.4*  PHOS  --   --   --  5.6* 5.4*   GFR: Estimated Creatinine Clearance: 8.8 mL/min (by C-G formula based on SCr of 8.94 mg/dL). Liver Function Tests:  Recent Labs Lab 09/04/15 1102 09/05/15 0059 09/05/15 1006 09/05/15 1317  AST 30 24  --   --   ALT 20 17  --   --   ALKPHOS 175* 120  --   --   BILITOT 3.1* 3.5*  --   --   PROT 8.0 6.2*  --   --   ALBUMIN 2.8* 2.2* 2.0* 2.0*   No results for input(s): LIPASE, AMYLASE in the last 168 hours. No results for input(s): AMMONIA in the last 168 hours. Coagulation Profile: No results for  input(s): INR, PROTIME in the last 168 hours. Cardiac Enzymes: No results for input(s): CKTOTAL, CKMB, CKMBINDEX, TROPONINI in the last 168 hours. BNP (last 3 results) No results for input(s): PROBNP in the last 8760 hours. HbA1C: No results for input(s): HGBA1C in the last 72 hours. CBG:  Recent Labs Lab 09/04/15 2024 09/04/15 2337 09/05/15 0345 09/05/15 0737 09/05/15 1310  GLUCAP 261* 269* 209* 201* 202*   Lipid Profile: No results for input(s): CHOL, HDL, LDLCALC, TRIG, CHOLHDL, LDLDIRECT in the last 72 hours. Thyroid Function Tests: No results for input(s): TSH, T4TOTAL, FREET4, T3FREE, THYROIDAB in the last 72 hours. Anemia Panel: No results for input(s): VITAMINB12, FOLATE, FERRITIN, TIBC,  IRON, RETICCTPCT in the last 72 hours. Urine analysis:    Component Value Date/Time   COLORURINE AMBER (A) 09/04/2015 1125   APPEARANCEUR CLOUDY (A) 09/04/2015 1125   LABSPEC 1.014 09/04/2015 1125   PHURINE 5.0 09/04/2015 1125   GLUCOSEU 150 (A) 09/04/2015 1125   HGBUR 2+ (A) 09/04/2015 1125   BILIRUBINUR NEGATIVE 09/04/2015 1125   KETONESUR NEGATIVE 09/04/2015 1125   PROTEINUR 100 (A) 09/04/2015 1125   NITRITE NEGATIVE 09/04/2015 1125   LEUKOCYTESUR 3+ (A) 09/04/2015 1125   Sepsis Labs: !!!!!!!!!!!!!!!!!!!!!!!!!!!!!!!!!!!!!!!!!!!! @LABRCNTIP (procalcitonin:4,lacticidven:4) ) Recent Results (from the past 240 hour(s))  Blood Culture (routine x 2)     Status: None (Preliminary result)   Collection Time: 09/04/15 11:00 AM  Result Value Ref Range Status   Specimen Description BLOOD RIGHT ARM  Final   Special Requests BOTTLES DRAWN AEROBIC AND ANAEROBIC 5CC  Final   Culture NO GROWTH < 24 HOURS  Final   Report Status PENDING  Incomplete  Blood Culture (routine x 2)     Status: None (Preliminary result)   Collection Time: 09/04/15 11:05 AM  Result Value Ref Range Status   Specimen Description BLOOD LEFT ARM  Final   Special Requests BOTTLES DRAWN AEROBIC AND ANAEROBIC 5CC   Final   Culture NO GROWTH < 24 HOURS  Final   Report Status PENDING  Incomplete  Urine culture     Status: None   Collection Time: 09/04/15 11:25 AM  Result Value Ref Range Status   Specimen Description URINE, RANDOM  Final   Special Requests Normal  Final   Culture NO GROWTH Performed at [facility]   Final   Report Status 09/05/2015 FINAL  Final  MRSA PCR Screening     Status: None   Collection Time: 09/04/15  2:19 PM  Result Value Ref Range Status   MRSA by PCR NEGATIVE NEGATIVE Final    Comment:        The GeneXpert MRSA Assay (FDA approved for NASAL specimens only), is one component of a comprehensive MRSA colonization surveillance program. It is not intended to diagnose MRSA infection nor to guide or monitor treatment for MRSA infections.   C difficile quick scan w PCR reflex     Status: None   Collection Time: 09/05/15 10:11 AM  Result Value Ref Range Status   C Diff antigen NEGATIVE NEGATIVE Final   C Diff toxin NEGATIVE NEGATIVE Final   C Diff interpretation No C. difficile detected.  Final  Gastrointestinal Panel by PCR , Stool     Status: None   Collection Time: 09/05/15 10:11 AM  Result Value Ref Range Status   Campylobacter species NOT DETECTED NOT DETECTED Final   Plesimonas shigelloides NOT DETECTED NOT DETECTED Final   Salmonella species NOT DETECTED NOT DETECTED Final   Yersinia enterocolitica NOT DETECTED NOT DETECTED Final   Vibrio species NOT DETECTED NOT DETECTED Final   Vibrio cholerae NOT DETECTED NOT DETECTED Final   Enteroaggregative E coli (EAEC) NOT DETECTED NOT DETECTED Final   Enteropathogenic E coli (EPEC) NOT DETECTED NOT DETECTED Final   Enterotoxigenic E coli (ETEC) NOT DETECTED NOT DETECTED Final   Shiga like toxin producing E coli (STEC) NOT DETECTED NOT DETECTED Final   E. coli O157 NOT DETECTED NOT DETECTED Final   Shigella/Enteroinvasive E coli (EIEC) NOT DETECTED NOT DETECTED Final   Cryptosporidium NOT DETECTED NOT  DETECTED Final   Cyclospora cayetanensis NOT DETECTED NOT DETECTED Final   Entamoeba histolytica NOT DETECTED NOT DETECTED Final  Giardia lamblia NOT DETECTED NOT DETECTED Final   Adenovirus F40/41 NOT DETECTED NOT DETECTED Final   Astrovirus NOT DETECTED NOT DETECTED Final   Norovirus GI/GII NOT DETECTED NOT DETECTED Final   Rotavirus A NOT DETECTED NOT DETECTED Final   Sapovirus (I, II, IV, and V) NOT DETECTED NOT DETECTED Final     Radiological Exams on Admission: Dg Chest 1 View  Result Date: 09/05/2015 CLINICAL DATA:  Unsuccessful central line placement. EXAM: CHEST 1 VIEW COMPARISON:  Radiograph of same day. FINDINGS: The heart size and mediastinal contours are within normal limits. No pleural effusion is noted. Right lung is clear. Possible pleural line is seen in left lung apex which may represent small apical pneumothorax. The visualized skeletal structures are unremarkable. IMPRESSION: Possible small left apical pneumothorax. Follow-up radiographs are recommended. Critical Value/emergent results were called by telephone at the time of interpretation on 09/05/2015 at 1:34 pm to Bennett Scrape, nurse practitioner, who verbally acknowledged these results and will immediately contact Dr. Nicholos Johns. Electronically Signed   By: Lupita Raider, M.D.   On: 09/05/2015 13:36  US Renal  Result Date: 09/04/2015 CLINICAL DATA:  Acute renal failure EXAM: RENAL / URINARY TRACT ULTRASOUND COMPLETE COMPARISON:  None. FINDINGS: Right Kidney: Length: 12.7 cm.  Slight increased cortical echogenicity Left Kidney: Length: 12.9 cm.  Mild hydronephrosis Bladder: The bladder is decompressed with a Foley catheter IMPRESSION: 1. Mild hydronephrosis on the left, age indeterminate. If there is concern for acute obstruction, CT scan may better evaluate. Electronically Signed   By: Gerome Sam III M.D   On: 09/04/2015 13:50  Portable Chest 1 View  Result Date: 09/05/2015 CLINICAL DATA:  Pneumonia EXAM:  PORTABLE CHEST 1 VIEW COMPARISON:  September 04, 2015 FINDINGS: There is patchy airspace consolidation in the right base. There is new mild atelectatic change in the left base, possibly represent a second focus of early pneumonia. Lungs elsewhere clear. Heart is upper normal in size with pulmonary vascularity within normal limits. No adenopathy. No bone lesions. IMPRESSION: Patchy airspace opacity in the right apex consistent with a degree of pneumonia. Atelectasis is in the left lower lobe, new ; question a second focus of pneumonia developing in the left base. Lungs elsewhere clear. Stable cardiac silhouette. Electronically Signed   By: Bretta Bang III M.D.   On: 09/05/2015 07:15  Dg Chest Port 1 View  Result Date: 09/04/2015 CLINICAL DATA:  Fever, probable sepsis, diabetic. Pt unable to give hx at this time EXAM: PORTABLE CHEST 1 VIEW COMPARISON:  07/10/2015 FINDINGS: Midline trachea. Normal heart size. Numerous leads and wires project over the chest. No pleural effusion or pneumothorax. Mild medial right lung base airspace disease is suspected. Clear left lung. IMPRESSION: Subtle medial right lung airspace disease. Although this could represent atelectasis, given the clinical history, suspicious for early infection. If possible, PA and lateral radiographs should be considered. Electronically Signed   By: Jeronimo Greaves M.D.   On: 09/04/2015 11:30   EKG: Independently reviewed.sinus with no ST elevations or depressions  Assessment/Plan Active Problems:   Sepsis (HCC) - Will place on broad spectrum antibiotics  - consulted critical care team as well  Left hydronephrosis - Discussed with Urology who is recommending CT scan of abdomen and pelvis non contrast.  - Also they are recommending IR consult for placement of drain    HCAP (healthcare-associated pneumonia) - Vanc and Zosyn on board    ARF (acute renal failure) (HCC) -  Mild hydronephrosis left side. Will obtain  CT scan to better evaluate  if there is acute obstruction which is most likely    Seizure disorder (HCC)  - continue home medication regimen  DVT prophylaxis: heparin Code Status: presumed full Family Communication: None at bedside Disposition Plan: Stepdown Consults called: Pulmonology: Dr. Kendrick Fries, Urology: Isabel Caprice Admission status: inpatient   Penny Pia MD [facility] Pager [phone]  If 7PM-7AM, please contact night-coverage [email or web address] Password [facility]  09/05/2015, 4:27 PM

## 2015-09-05 NOTE — Progress Notes (Signed)
PULMONARY / CRITICAL CARE MEDICINE   Name: Cory Salazar MRN: [id number] DOB: May 21, 1960    ADMISSION DATE:  09/05/2015 CONSULTATION DATE:  09/05/15  REFERRING MD: Amado Coe, A  CHIEF COMPLAINT:  Acute Renal Failure  Brief: 55 y/o male admitted to [facility] on 7/24 with fever, possible pneumonia and AKI.  Baseline "encephalopathy" post treatment for brain cancer.  He was treated with antibiotics but due to ongoing renal failure PCCM was consulted for possible CVVHD. He was transferred to [facility] due to an infrastructure emergency at [facility].   SUBJECTIVE:  As above  VITAL SIGNS: BP (!) 89/61 (BP Location: Left Arm)   Temp 98 F (36.7 C) (Axillary)   Resp 15   Wt 143 lb 4.8 oz (65 kg)   SpO2 99%   BMI 18.91 kg/m   HEMODYNAMICS:    VENTILATOR SETTINGS:    INTAKE / OUTPUT: No intake/output data recorded.  PHYSICAL EXAMINATION: General:  No distress on room air HENT: NCAT OP clear PULM: CTA B, normal effort CV: RRR, no mgr GI: BS+, soft, nontender MSK: normal bulk and tone Neuro: Eyes open, tracks, doesn't follow commands  LABS:  BMET  Recent Labs Lab 09/05/15 0059 09/05/15 1006 09/05/15 1317  NA 146* 143 144  K 4.2 4.1 4.0  CL 117* 115* 115*  CO2 16* 13* 11*  BUN 117* 126* 129*  CREATININE 9.08* 9.57* 8.94*  GLUCOSE 270* 228* 220*    Electrolytes  Recent Labs Lab 09/05/15 0059 09/05/15 1006 09/05/15 1317  CALCIUM 8.5* 8.9 8.4*  PHOS  --  5.6* 5.4*    CBC  Recent Labs Lab 09/04/15 1102 09/05/15 0059  WBC 17.3* 15.1*  HGB 12.5* 10.7*  HCT 38.5* 32.6*  PLT 147* 82*    Coag's No results for input(s): APTT, INR in the last 168 hours.  Sepsis Markers  Recent Labs Lab 09/04/15 1105 09/04/15 1418 09/05/15 0059  LATICACIDVEN 1.9 2.9* 1.1    ABG  Recent Labs Lab 09/04/15 1102  PHART 7.30*  PCO2ART 35  PO2ART 65*    Liver Enzymes  Recent Labs Lab 09/04/15 1102 09/05/15 0059 09/05/15 1006 09/05/15 1317  AST 30 24  --   --   ALT 20 17   --   --   ALKPHOS 175* 120  --   --   BILITOT 3.1* 3.5*  --   --   ALBUMIN 2.8* 2.2* 2.0* 2.0*    Cardiac Enzymes No results for input(s): TROPONINI, PROBNP in the last 168 hours.  Glucose  Recent Labs Lab 09/04/15 1544 09/04/15 2024 09/04/15 2337 09/05/15 0345 09/05/15 0737 09/05/15 1310  GLUCAP 240* 261* 269* 209* 201* 202*    Imaging Dg Chest 1 View  Result Date: 09/05/2015 CLINICAL DATA:  Unsuccessful central line placement. EXAM: CHEST 1 VIEW COMPARISON:  Radiograph of same day. FINDINGS: The heart size and mediastinal contours are within normal limits. No pleural effusion is noted. Right lung is clear. Possible pleural line is seen in left lung apex which may represent small apical pneumothorax. The visualized skeletal structures are unremarkable. IMPRESSION: Possible small left apical pneumothorax. Follow-up radiographs are recommended. Critical Value/emergent results were called by telephone at the time of interpretation on 09/05/2015 at 1:34 pm to Bennett Scrape, nurse practitioner, who verbally acknowledged these results and will immediately contact Dr. Nicholos Johns. Electronically Signed   By: Lupita Raider, M.D.   On: 09/05/2015 13:36  Portable Chest 1 View  Result Date: 09/05/2015 CLINICAL DATA:  Pneumonia EXAM: PORTABLE CHEST 1  VIEW COMPARISON:  September 04, 2015 FINDINGS: There is patchy airspace consolidation in the right base. There is new mild atelectatic change in the left base, possibly represent a second focus of early pneumonia. Lungs elsewhere clear. Heart is upper normal in size with pulmonary vascularity within normal limits. No adenopathy. No bone lesions. IMPRESSION: Patchy airspace opacity in the right apex consistent with a degree of pneumonia. Atelectasis is in the left lower lobe, new ; question a second focus of pneumonia developing in the left base. Lungs elsewhere clear. Stable cardiac silhouette. Electronically Signed   By: Bretta Bang III M.D.    On: 09/05/2015 07:15    STUDIES:  7/23 renal ultrasound>>. Mild hydronephrosis on the left,   CULTURES: 7/23 blood cultures>> NGTD 7/24 C. Difficile>> negative  ANTIBIOTICS: 7/23 Zosyn>>   7/23 vancomycin >> 7/24   SIGNIFICANT EVENTS: 7/23 patient admitted to the ICU with right-sided pneumonia and renal failure requiring CRRT  LINES/TUBES: 7/24 left femoral>>  DISCUSSION: 55 year old with diabetes mellitus, encephalopathy, brain tumor status post resection, major depressive disorder and seizures, presenting with right-sided pneumonia and renal failure.  He also has a UTI with hydronephrosis.  Urology has seen him in [place] and have helped coordinate his care here in [place].  Will await recommendations from Dr. Isabel Caprice, but I agree with the plan for percutaneous nephrostomy for source control of sepsis.  ASSESSMENT / PLAN:  PULMONARY A: HCAP Possible left pneumothorax P:   Continue Zosyn Monitor respiratory status Bronchodilators Follow cultures CXR now and again in AM Monitor without pleural intervention given small nature of possible pneumothorax  CARDIOVASCULAR A:  No active issues P:  Continuous telemetry Keep MAP>65  RENAL A:   Acute renal failure > improving slowly with IVF > appears dry Left-sided hydronephrosis Lactic acidosis - resolved Hypernatremia -resolving slowly P:   Will bolus another 500cc saline now IR/Urology to see for nephrostomy tube F/U nephrology recommendations Could hold off on CRRT from my standpoint Replace electrolytes per ICU protocol BMET in am Continue IVF with 0.45n/s@125   GASTROINTESTINAL A:   No active issues P:   Soft diet Continue Pepcid   HEMATOLOGIC A:   Thrombocytopenia P:  Heparin for DVT prophylaxis SCD Will transfuse if Hgb <7   INFECTIOUS A:    Right sided pneumonia Urinary tract infection with hydronephrosis P:   Continue antibiotics Monitor fever curve Follow  cultures  ENDOCRINE A:   Diabetes mellitus P:   Blood sugar checks q4 hour SSI coverage  NEUROLOGIC A:    History of encephalopathy,S/P brain tumor resection  History of MDD History of seizures P:   RASS goal: 0  Continue Kepra /lamictal Dilantin per pharmacy Continue Zoloft/Remeron    My cc time 40 minutes  He[place] , MD [facility] Pager: [phone] Cell: [phone] After 3pm or if no response, call [phone]    09/05/2015, 5:02 PM

## 2015-09-05 NOTE — Progress Notes (Signed)
Patient left with [facility]. Attempted to call report to [facility] ICU, but charge RN (who will receive patient) was with another patient, and will call back. RN did inform that [facility] had left with patient to Diplomatic Services operational officer. RN did receive call from sister, Magda Paganini, after morning of not being able to reach any family. Sister Magda Paganini updated on plan but stated that there was another sister, Chevis Pretty (correct spelling, married name so different from patient), who was actually in her terms the patient's legal guardian. Magda Paganini was unsure of Judy's number off the top of her head and was supposed to call RN back but has not done so.

## 2015-09-05 NOTE — Progress Notes (Signed)
Inpatient Diabetes Program Recommendations  AACE/ADA: New Consensus Statement on Inpatient Glycemic Control (2015)  Target Ranges:  Prepandial:   less than 140 mg/dL      Peak postprandial:   less than 180 mg/dL (1-2 hours)      Critically ill patients:  140 - 180 mg/dL   Results for EDWIN, DUDEK (MRN [id number]) as of 09/05/2015 09:20  Ref. Range 09/04/2015 15:44 09/04/2015 20:24 09/04/2015 23:37 09/05/2015 03:45 09/05/2015 07:37  Glucose-Capillary Latest Ref Range: 65 - 99 mg/dL 720 (H) 947 (H) 096 (H) 209 (H) 201 (H)   Review of Glycemic Control  Diabetes history: DM2 Outpatient Diabetes medications: Lantus 20 units QHS, Novolog 7 units TID with meals Current orders for Inpatient glycemic control: Lantus 20 units QHS, Novolog 0-9 units Q4H  Inpatient Diabetes Program Recommendations:  Insulin - Basal: If steroids are continued, please consider increasing Lantus to 25 units QHS.  Thanks, Orlando Penner, RN, MSN, CDE Diabetes Coordinator Inpatient Diabetes Program [phone] (Team Pager from 8am to 5pm) [phone] ([facility] office) [phone] [facility] office) [phone] [facility] office)

## 2015-09-05 NOTE — Progress Notes (Signed)
Pharmacy Antibiotic Note  Cory Salazar is a 55 y.o. male with PMHx brain tumor s/p resection with subsequent psychosis and seizures, diabetes, MDD, and anxiety presents from SNF with lethargy, found to have ARF and sepsis 2/2 HCAP.  Appears plan was for pt to start CRRT however does not appear that it had actually started yet, then pt transferred from [place] to [facility].  Nephrology consult placed. Pharmacy has been consulted for Vancomycin and Zosyn dosing.    -Note pt received Vancomycin x 1 on 7/23 @ 1100 and Zosyn 3.375g last on 7/24 @ 1318.   Plan: Vancomycin 1g IV q48h - next dose due 7/25 Zosyn 2.25g IV q8h F/u renal function, nephrology recommendations, vancomycin levels as warranted   Weight: 143 lb 4.8 oz (65 kg)  Temp (24hrs), Avg:98.2 F (36.8 C), Min:96.9 F (36.1 C), Max:99 F (37.2 C)   Recent Labs Lab 09/04/15 1102 09/04/15 1105 09/04/15 1418 09/05/15 0059 09/05/15 1006 09/05/15 1317  WBC 17.3*  --   --  15.1*  --   --   CREATININE 9.15*  --  8.66* 9.08* 9.57* 8.94*  LATICACIDVEN  --  1.9 2.9* 1.1  --   --     Estimated Creatinine Clearance: 8.8 mL/min (by C-G formula based on SCr of 8.94 mg/dL).    No Known Allergies  Antimicrobials this admission: 7/23 Vancomycin >>  7/23 Zosyn >>   Dose adjustments this admission:  Microbiology results: 7/23 BCx: NGTD < 24 hours 7/23 UCx: NGF  7/23 MRSA PCR: negative 7/24 CDiff antigen / toxin negative 7/24 GI panel negative  Thank you for allowing pharmacy to be a part of this patient's care.  Haynes Hoehn, PharmD, BCPS 09/05/2015, 5:13 PM  Pager: [phone]

## 2015-09-05 NOTE — Progress Notes (Signed)
PT Cancellation Note  Patient Details Name: Cory Salazar MRN: [id number] DOB: 04-08-1960   Cancelled Treatment:    Reason Eval/Treat Not Completed: Medical issues which prohibited therapy. Order received, chart reviewed. With RN clearance, evaluation was attempted, but pt is unable to follow simple one-step commands at this time. Spontaneously wiggles R toes, but does not actively move either LE on command. Will re-attempt evaluation at later date/time, when pt is able to participate.   Erie Sica, SPT 09/05/2015, 9:47 AM

## 2015-09-05 NOTE — Progress Notes (Signed)
Report called Zenaida Niece. Patient's sister, Magda Paganini, had called back just before report and gave number for possible legal guardian Chevis Pretty. Number entered into the computer. Magda Paganini also stated that Darel Hong had "signed a paper" that allowed Magda Paganini to make decisions for patient, receiving RN informed. Receiving RN declined for this RN to try and call Darel Hong at this time.

## 2015-09-05 NOTE — Progress Notes (Signed)
RN attempted to contact family, with no answer on either numbers listed. RN also contacted [facility] to see if they had a different number and they only had the numbers that hospital staff had already attempted with no response.

## 2015-09-05 NOTE — Progress Notes (Signed)
Subjective: Patient was transferred from [facility]. Consultation was provided today by Dr. Apolinar Junes. Her notes have been reviewed. This patient is unable to provide any additional history. No family is available. He has no prior history that we can find from a urologic perspective. He apparently has normal baseline renal function but has a creatinine now of close to 10. He was documented to have at least mild left hydronephrosis with presumed urosepsis. No imaging studies other than renal ultrasound were available at [facility]. He was transferred for ongoing care to allow for more definitive imaging and potential intervention with percutaneous nephrostomy tube by interventional radiology if needed. Urine output has picked up.  Objective: Vital signs in last 24 hours: Temp:  [96.9 F (36.1 C)-99 F (37.2 C)] 98 F (36.7 C) (07/24 1600) Pulse Rate:  [84-107] 94 (07/24 1300) Resp:  [12-26] 15 (07/24 1452) BP: (84-98)/(61-74) 89/61 (07/24 1452) SpO2:  [90 %-100 %] 99 % (07/24 1452) Weight:  [65 kg (143 lb 4.8 oz)-66.4 kg (146 lb 6.2 oz)] 65 kg (143 lb 4.8 oz) (07/24 1452)  Intake/Output from previous day: No intake/output data recorded. Intake/Output this shift: No intake/output data recorded.  Physical Exam:  Constitutional: Vital signs reviewed. WD WN in NAD   Eyes: PERRL, No scleral icterus.   Cardiovascular: RRR Pulmonary/Chest: Normal effort Abdominal: Soft. Non-tender, non-distended, bowel sounds are normal, no masses, organomegaly, or guarding present.  Genitourinary: Foley catheter indwelling Extremities: No cyanosis or edema   Lab Results:  Recent Labs  09/04/15 1102 09/05/15 0059  HGB 12.5* 10.7*  HCT 38.5* 32.6*   BMET  Recent Labs  09/05/15 1006 09/05/15 1317  NA 143 144  K 4.1 4.0  CL 115* 115*  CO2 13* 11*  GLUCOSE 228* 220*  BUN 126* 129*  CREATININE 9.57* 8.94*  CALCIUM 8.9 8.4*   No results for input(s): LABPT, INR in the last 72  hours. No results for input(s): LABURIN in the last 72 hours. Results for orders placed or performed during the hospital encounter of 09/04/15  Blood Culture (routine x 2)     Status: None (Preliminary result)   Collection Time: 09/04/15 11:00 AM  Result Value Ref Range Status   Specimen Description BLOOD RIGHT ARM  Final   Special Requests BOTTLES DRAWN AEROBIC AND ANAEROBIC 5CC  Final   Culture NO GROWTH < 24 HOURS  Final   Report Status PENDING  Incomplete  Blood Culture (routine x 2)     Status: None (Preliminary result)   Collection Time: 09/04/15 11:05 AM  Result Value Ref Range Status   Specimen Description BLOOD LEFT ARM  Final   Special Requests BOTTLES DRAWN AEROBIC AND ANAEROBIC 5CC  Final   Culture NO GROWTH < 24 HOURS  Final   Report Status PENDING  Incomplete  Urine culture     Status: None   Collection Time: 09/04/15 11:25 AM  Result Value Ref Range Status   Specimen Description URINE, RANDOM  Final   Special Requests Normal  Final   Culture NO GROWTH Performed at [facility]   Final   Report Status 09/05/2015 FINAL  Final  MRSA PCR Screening     Status: None   Collection Time: 09/04/15  2:19 PM  Result Value Ref Range Status   MRSA by PCR NEGATIVE NEGATIVE Final    Comment:        The GeneXpert MRSA Assay (FDA approved for NASAL specimens only), is one component of a comprehensive MRSA colonization  surveillance program. It is not intended to diagnose MRSA infection nor to guide or monitor treatment for MRSA infections.   C difficile quick scan w PCR reflex     Status: None   Collection Time: 09/05/15 10:11 AM  Result Value Ref Range Status   C Diff antigen NEGATIVE NEGATIVE Final   C Diff toxin NEGATIVE NEGATIVE Final   C Diff interpretation No C. difficile detected.  Final  Gastrointestinal Panel by PCR , Stool     Status: None   Collection Time: 09/05/15 10:11 AM  Result Value Ref Range Status   Campylobacter species NOT DETECTED NOT  DETECTED Final   Plesimonas shigelloides NOT DETECTED NOT DETECTED Final   Salmonella species NOT DETECTED NOT DETECTED Final   Yersinia enterocolitica NOT DETECTED NOT DETECTED Final   Vibrio species NOT DETECTED NOT DETECTED Final   Vibrio cholerae NOT DETECTED NOT DETECTED Final   Enteroaggregative E coli (EAEC) NOT DETECTED NOT DETECTED Final   Enteropathogenic E coli (EPEC) NOT DETECTED NOT DETECTED Final   Enterotoxigenic E coli (ETEC) NOT DETECTED NOT DETECTED Final   Shiga like toxin producing E coli (STEC) NOT DETECTED NOT DETECTED Final   E. coli O157 NOT DETECTED NOT DETECTED Final   Shigella/Enteroinvasive E coli (EIEC) NOT DETECTED NOT DETECTED Final   Cryptosporidium NOT DETECTED NOT DETECTED Final   Cyclospora cayetanensis NOT DETECTED NOT DETECTED Final   Entamoeba histolytica NOT DETECTED NOT DETECTED Final   Giardia lamblia NOT DETECTED NOT DETECTED Final   Adenovirus F40/41 NOT DETECTED NOT DETECTED Final   Astrovirus NOT DETECTED NOT DETECTED Final   Norovirus GI/GII NOT DETECTED NOT DETECTED Final   Rotavirus A NOT DETECTED NOT DETECTED Final   Sapovirus (I, II, IV, and V) NOT DETECTED NOT DETECTED Final    Studies/Results: Dg Chest 1 View  Result Date: 09/05/2015 CLINICAL DATA:  Unsuccessful central line placement. EXAM: CHEST 1 VIEW COMPARISON:  Radiograph of same day. FINDINGS: The heart size and mediastinal contours are within normal limits. No pleural effusion is noted. Right lung is clear. Possible pleural line is seen in left lung apex which may represent small apical pneumothorax. The visualized skeletal structures are unremarkable. IMPRESSION: Possible small left apical pneumothorax. Follow-up radiographs are recommended. Critical Value/emergent results were called by telephone at the time of interpretation on 09/05/2015 at 1:34 pm to Bennett Scrape, nurse practitioner, who verbally acknowledged these results and will immediately contact Dr. Nicholos Johns.  Electronically Signed   By: Lupita Raider, M.D.   On: 09/05/2015 13:36  US Renal  Result Date: 09/04/2015 CLINICAL DATA:  Acute renal failure EXAM: RENAL / URINARY TRACT ULTRASOUND COMPLETE COMPARISON:  None. FINDINGS: Right Kidney: Length: 12.7 cm.  Slight increased cortical echogenicity Left Kidney: Length: 12.9 cm.  Mild hydronephrosis Bladder: The bladder is decompressed with a Foley catheter IMPRESSION: 1. Mild hydronephrosis on the left, age indeterminate. If there is concern for acute obstruction, CT scan may better evaluate. Electronically Signed   By: Gerome Sam III M.D   On: 09/04/2015 13:50  Dg Chest Port 1 View  Result Date: 09/05/2015 CLINICAL DATA:  Chart states that there have been several attempts to place a "left" side IJ PICC line and there was concern for a possible pneumothorax. Pt has a medium sized bandage on right side of neck, suggesting that IJ PICC was attempted on right side. Ordering MD looked at image outside of patient's room directly after I took it. Hx of diabetes. Pt was not able  to verbally respond. EXAM: PORTABLE CHEST 1 VIEW COMPARISON:  09/05/2015 at 12:52 a.m. FINDINGS: There is now no evidence of a left pneumothorax. The previously seen linear opacity near the left apex was likely an artifact. Mild opacity at the right lung base is consistent with atelectasis. Consider pneumonia if there are consistent clinical symptoms. Lungs otherwise clear. No pleural effusion. Heart, mediastinum and hila are unremarkable. IMPRESSION: 1. No evidence of a pneumothorax on the current study. 2. Right lung base opacity most likely atelectasis. Pneumonia is possible. Electronically Signed   By: Amie Portland M.D.   On: 09/05/2015 17:38  Portable Chest 1 View  Result Date: 09/05/2015 CLINICAL DATA:  Pneumonia EXAM: PORTABLE CHEST 1 VIEW COMPARISON:  September 04, 2015 FINDINGS: There is patchy airspace consolidation in the right base. There is new mild atelectatic change in the left  base, possibly represent a second focus of early pneumonia. Lungs elsewhere clear. Heart is upper normal in size with pulmonary vascularity within normal limits. No adenopathy. No bone lesions. IMPRESSION: Patchy airspace opacity in the right apex consistent with a degree of pneumonia. Atelectasis is in the left lower lobe, new ; question a second focus of pneumonia developing in the left base. Lungs elsewhere clear. Stable cardiac silhouette. Electronically Signed   By: Bretta Bang III M.D.   On: 09/05/2015 07:15  Dg Chest Port 1 View  Result Date: 09/04/2015 CLINICAL DATA:  Fever, probable sepsis, diabetic. Pt unable to give hx at this time EXAM: PORTABLE CHEST 1 VIEW COMPARISON:  07/10/2015 FINDINGS: Midline trachea. Normal heart size. Numerous leads and wires project over the chest. No pleural effusion or pneumothorax. Mild medial right lung base airspace disease is suspected. Clear left lung. IMPRESSION: Subtle medial right lung airspace disease. Although this could represent atelectasis, given the clinical history, suspicious for early infection. If possible, PA and lateral radiographs should be considered. Electronically Signed   By: Jeronimo Greaves M.D.   On: 09/04/2015 11:30   Assessment/Plan:  Presumed urosepsis with acute renal failure and mild left hydronephrosis. Given unilateral mild hydronephrosis obstruction is not the primary reason for this patient's renal failure. The real concern is whether there is significant obstruction of the left kidney and if that is contributing to urosepsis. Obviously source control would be critical.  This patient requires a noncontrast CT to further assess for hydronephrosis and any etiology such as an obstructing stone. If the patient does have obstruction documented of the left kidney then a percutaneous nephrostomy tube would be my recommendation. I will order noncontrast CT.   LOS: 0 days   Nur Krasinski S 09/05/2015, 5:51 PM

## 2015-09-06 ENCOUNTER — Encounter (HOSPITAL_COMMUNITY): Payer: Self-pay | Admitting: Nurse Practitioner

## 2015-09-06 ENCOUNTER — Inpatient Hospital Stay (HOSPITAL_COMMUNITY): Payer: Medicare Other

## 2015-09-06 DIAGNOSIS — N179 Acute kidney failure, unspecified: Secondary | ICD-10-CM

## 2015-09-06 DIAGNOSIS — J189 Pneumonia, unspecified organism: Secondary | ICD-10-CM

## 2015-09-06 DIAGNOSIS — E440 Moderate protein-calorie malnutrition: Secondary | ICD-10-CM | POA: Diagnosis present

## 2015-09-06 HISTORY — PX: IR GENERIC HISTORICAL: IMG1180011

## 2015-09-06 LAB — GLUCOSE, CAPILLARY
GLUCOSE-CAPILLARY: 258 mg/dL — AB (ref 65–99)
GLUCOSE-CAPILLARY: 245 mg/dL — AB (ref 65–99)
GLUCOSE-CAPILLARY: 207 mg/dL — AB (ref 65–99)
Glucose-Capillary: 218 mg/dL — ABNORMAL HIGH (ref 65–99)

## 2015-09-06 LAB — COMPREHENSIVE METABOLIC PANEL
ALBUMIN: 2.3 g/dL — AB (ref 3.5–5.0)
ALT: 15 U/L — ABNORMAL LOW (ref 17–63)
AST: 11 U/L — AB (ref 15–41)
Alkaline Phosphatase: 83 U/L (ref 38–126)
Anion gap: 14 (ref 5–15)
BILIRUBIN TOTAL: 1.8 mg/dL — AB (ref 0.3–1.2)
BUN: 127 mg/dL — AB (ref 6–20)
CO2: 13 mmol/L — AB (ref 22–32)
Calcium: 8.6 mg/dL — ABNORMAL LOW (ref 8.9–10.3)
Chloride: 119 mmol/L — ABNORMAL HIGH (ref 101–111)
Creatinine, Ser: 8 mg/dL — ABNORMAL HIGH (ref 0.61–1.24)
GFR calc Af Amer: 8 mL/min — ABNORMAL LOW (ref >60)
GFR calc non Af Amer: 7 mL/min — ABNORMAL LOW (ref >60)
GLUCOSE: 291 mg/dL — AB (ref 65–99)
POTASSIUM: 4.3 mmol/L (ref 3.5–5.1)
SODIUM: 146 mmol/L — AB (ref 135–145)
TOTAL PROTEIN: 6 g/dL — AB (ref 6.5–8.1)

## 2015-09-06 LAB — PROTEIN ELECTROPHORESIS, SERUM
A/G Ratio: 0.6 — ABNORMAL LOW (ref 0.7–1.7)
ALBUMIN ELP: 1.9 g/dL — AB (ref 2.9–4.4)
ALPHA-1-GLOBULIN: 0.5 g/dL — AB (ref 0.0–0.4)
ALPHA-2-GLOBULIN: 1.1 g/dL — AB (ref 0.4–1.0)
BETA GLOBULIN: 0.9 g/dL (ref 0.7–1.3)
GAMMA GLOBULIN: 0.8 g/dL (ref 0.4–1.8)
Globulin, Total: 3.3 g/dL (ref 2.2–3.9)
Total Protein ELP: 5.2 g/dL — ABNORMAL LOW (ref 6.0–8.5)

## 2015-09-06 LAB — CBC
HEMATOCRIT: 34.3 % — AB (ref 39.0–52.0)
HEMOGLOBIN: 10.9 g/dL — AB (ref 13.0–17.0)
MCH: 25.2 pg — AB (ref 26.0–34.0)
MCHC: 31.8 g/dL (ref 30.0–36.0)
MCV: 79.4 fL (ref 78.0–100.0)
Platelets: 84 10*3/uL — ABNORMAL LOW (ref 150–400)
RBC: 4.32 MIL/uL (ref 4.22–5.81)
RDW: 16.4 % — AB (ref 11.5–15.5)
WBC: 14.5 10*3/uL — ABNORMAL HIGH (ref 4.0–10.5)

## 2015-09-06 LAB — C3 COMPLEMENT: C3 COMPLEMENT: 96 mg/dL (ref 82–167)

## 2015-09-06 LAB — MPO/PR-3 (ANCA) ANTIBODIES: Myeloperoxidase Abs: <9 U/mL (ref 0.0–9.0)

## 2015-09-06 LAB — PROTIME-INR
INR: 1.48 (ref 0.00–1.49)
Prothrombin Time: 18 seconds — ABNORMAL HIGH (ref 11.6–15.2)

## 2015-09-06 LAB — C4 COMPLEMENT: COMPLEMENT C4, BODY FLUID: 12 mg/dL — AB (ref 14–44)

## 2015-09-06 LAB — ANA W/REFLEX IF POSITIVE: Anti Nuclear Antibody(ANA): NEGATIVE

## 2015-09-06 MED ORDER — DEXTROSE 5 % IV SOLN
INTRAVENOUS | Status: DC
  Administered 2015-09-06: 09:00:00 via INTRAVENOUS

## 2015-09-06 MED ORDER — MIDAZOLAM HCL 2 MG/2ML IJ SOLN
INTRAMUSCULAR | Status: AC
  Filled 2015-09-06: qty 6

## 2015-09-06 MED ORDER — LIDOCAINE HCL 1 % IJ SOLN
INTRAMUSCULAR | Status: AC
  Filled 2015-09-06: qty 20

## 2015-09-06 MED ORDER — FENTANYL CITRATE (PF) 100 MCG/2ML IJ SOLN
INTRAMUSCULAR | Status: AC
  Filled 2015-09-06: qty 4

## 2015-09-06 MED ORDER — INSULIN GLARGINE 100 UNIT/ML ~~LOC~~ SOLN
5.0000 [IU] | Freq: Every day | SUBCUTANEOUS | Status: DC
  Administered 2015-09-06 – 2015-09-09 (×4): 5 [IU] via SUBCUTANEOUS
  Filled 2015-09-06 (×5): qty 0.05

## 2015-09-06 MED ORDER — PHENYTOIN SODIUM 50 MG/ML IJ SOLN
300.0000 mg | Freq: Once | INTRAMUSCULAR | Status: AC
  Administered 2015-09-06: 300 mg via INTRAVENOUS
  Filled 2015-09-06: qty 5

## 2015-09-06 MED ORDER — HEPARIN SODIUM (PORCINE) 1000 UNIT/ML DIALYSIS
1000.0000 [IU] | INTRAMUSCULAR | Status: DC | PRN
  Administered 2015-09-06: 2800 [IU] via INTRAVENOUS_CENTRAL
  Filled 2015-09-06 (×2): qty 3

## 2015-09-06 MED ORDER — DEXTROSE-NACL 5-0.45 % IV SOLN
INTRAVENOUS | Status: DC
  Administered 2015-09-06: 75 mL via INTRAVENOUS
  Administered 2015-09-07 – 2015-09-08 (×3): via INTRAVENOUS
  Administered 2015-09-09: 75 mL via INTRAVENOUS
  Administered 2015-09-09 – 2015-09-10 (×2): via INTRAVENOUS

## 2015-09-06 MED ORDER — MIDAZOLAM HCL 2 MG/2ML IJ SOLN
INTRAMUSCULAR | Status: AC | PRN
  Administered 2015-09-06 (×4): 0.5 mg via INTRAVENOUS

## 2015-09-06 MED ORDER — CEFAZOLIN SODIUM-DEXTROSE 2-4 GM/100ML-% IV SOLN
2.0000 g | INTRAVENOUS | Status: AC
  Administered 2015-09-06: 2 g via INTRAVENOUS
  Filled 2015-09-06: qty 100

## 2015-09-06 MED ORDER — AMOXICILLIN-POT CLAVULANATE 875-125 MG PO TABS: 1.0000 | ORAL_TABLET | Freq: Two times a day (BID) | ORAL | Status: DC

## 2015-09-06 MED ORDER — AMOXICILLIN-POT CLAVULANATE 500-125 MG PO TABS
1.0000 | ORAL_TABLET | Freq: Every day | ORAL | Status: DC
  Filled 2015-09-06 (×2): qty 1

## 2015-09-06 MED ORDER — FENTANYL CITRATE (PF) 100 MCG/2ML IJ SOLN
INTRAMUSCULAR | Status: AC | PRN
  Administered 2015-09-06: 25 ug via INTRAVENOUS

## 2015-09-06 NOTE — Procedures (Signed)
Interventional Radiology Procedure Note  Procedure:  Left percutaneous nephrostomy  Complications:  None  Estimated Blood Loss: 25 mL  10 Fr PCN advanced via LP access.  Bloody urine return after tube placement.  Attached to gravity bag.  Jodi Marble. Fredia Sorrow, M.D Pager:  [phone]

## 2015-09-06 NOTE — Evaluation (Signed)
SLP Cancellation Note  Patient Details Name: Cory Salazar MRN: [id number] DOB: Sep 24, 1960   Cancelled treatment:       Reason Eval/Treat Not Completed:  (RN reports pt not following directions and every time she attempts mouth care, he clamps mouth shut, will attempt next am)  Donavan Burnet, MS [facility] SLP [phone]

## 2015-09-06 NOTE — Consult Note (Signed)
Renal Service Consult Note [facility]  Cory Salazar 09/06/2015 Cory Salazar D Requesting Physician:  Dr.  Kendrick Salazar  Reason for Consult:  Acute renal failure HPI: The patient is a 55 y.o. year-old admitted at [facility] 7/23 with sepsis/ HCAP, acute cystitis w L sided hydronephrosis, rx with IV abx, AKI. Transferred to [facility] due to possible need for perc nephrostomy.  Hx of brain tumor resection, hx seizure d/o.  Diabetic on insulin.   Today his creat is 8.0, yesterday peaked at 9.08.  In May his creat was 0.63.  Also K 4.3, CO2 13, BUN 127, glu 127, Ca 8.6 , alb 2.3, LFT"s ok, Tbili 3.5 down to 1.8 today.  UA showing 100 prot, tntc WBC, 6-30 rbc, rare bact.  CT scan 7/24 showed L ureteral stone w associated upstream pelvicaliectasis/ ureterectasis.  7/23 renal US confirmed mild L hydro.    Was seen in ED May 28th with fall out of [facility], probable seizure related.  Comments were made about "dementia" at baseline, but his baseline MS was unclear.  He was dc'd home from ED.   Per pt's sister , Cory Salazar, Patient is bedbound, requires 2-person help to get up in the [facility].  Will speak reportedly if he knows the person, as of May 2017 the last time she saw him.  Had brain surgery 20 years ago and has been debilitated since the brain surgery and has been in a SNF "for about the last 15- 20 years".  He was moved from [place] to [facility] about 1-2 months ago when the SNF he was in in [place] closed down and there was no other facility in [place] that would take him.      Past Medical History  Past Medical History:  Diagnosis Date  . Anxiety disorder   . Diabetes mellitus without complication (HCC)   . Encephalopathy   . MDD (major depressive disorder) (HCC)   . Seizure [facility])    Past Surgical History History reviewed. No pertinent surgical history. Family History History reviewed. No pertinent family history. Social History  has an unknown smoking status. He has never used  smokeless tobacco. He reports that he does not drink alcohol or use drugs. Allergies No Known Allergies Home medications Prior to Admission medications   Medication Sig Start Date End Date Taking? Authorizing Provider  acetaminophen (TYLENOL) 325 MG tablet Take 650 mg by mouth every 4 (four) hours as needed for mild pain, moderate pain or fever.   Yes Historical Provider, MD  insulin glargine (LANTUS) 100 UNIT/ML injection Inject 0.2 mLs (20 Units total) into the skin at bedtime. 09/05/15  Yes Ramonita Lab, MD  ipratropium-albuterol (DUONEB) 0.5-2.5 (3) MG/3ML SOLN Take 3 mLs by nebulization 4 (four) times daily. 09/05/15  Yes Ramonita Lab, MD  lactulose (CHRONULAC) 10 GM/15ML solution Take 30 mLs (20 g total) by mouth 2 (two) times daily. 09/05/15  Yes Ramonita Lab, MD  lamoTRIgine (LAMICTAL) 100 MG tablet Take 1 tablet (100 mg total) by mouth 2 (two) times daily. 09/05/15  Yes Ramonita Lab, MD  levETIRAcetam 500 mg in sodium chloride 0.9 % 100 mL Inject 500 mg into the vein every 12 (twelve) hours. 09/05/15  Yes Ramonita Lab, MD  LORazepam (ATIVAN) 2 MG/ML injection Inject 0.5 mLs (1 mg total) into the vein every 4 (four) hours as needed for seizure. 09/05/15  Yes Ramonita Lab, MD  mirtazapine (REMERON) 15 MG tablet Take 1 tablet (15 mg total) by mouth at bedtime. 09/05/15  Yes Ramonita Lab, MD  phenytoin (DILANTIN) 50 MG tablet Chew 6 tablets (300 mg total) by mouth at bedtime. 07/10/15 07/09/16 Yes Jeanmarie Plant, MD  piperacillin-tazobactam (ZOSYN) 3.375 GM/50ML IVPB Inject 50 mLs (3.375 g total) into the vein every 8 (eight) hours. 09/05/15  Yes Ramonita Lab, MD  potassium chloride SA (K-DUR,KLOR-CON) 20 MEQ tablet Take 20 mEq by mouth 2 (two) times daily.   Yes Historical Provider, MD  QUEtiapine (SEROQUEL) 200 MG tablet Take 200 mg by mouth 2 (two) times daily. **Medication on hold from 09/01/15 to 09/04/15 per MD at [facility]**   Yes Historical Provider, MD  sertraline (ZOLOFT) 50 MG tablet Take 50  mg by mouth daily.   Yes Historical Provider, MD  sodium chloride 0.45 % solution Inject 100 mLs into the vein continuous. 09/05/15  Yes Ramonita Lab, MD  sodium chloride flush (NS) 0.9 % SOLN Inject 3 mLs into the vein every 12 (twelve) hours. 09/05/15  Yes Ramonita Lab, MD  tamsulosin (FLOMAX) 0.4 MG CAPS capsule Take 0.4 mg by mouth daily.   Yes Historical Provider, MD   Liver Function Tests  Recent Labs Lab 09/04/15 1102 09/05/15 0059 09/05/15 1006 09/05/15 1317 09/06/15 0337  AST 30 24  --   --  11*  ALT 20 17  --   --  15*  ALKPHOS 175* 120  --   --  83  BILITOT 3.1* 3.5*  --   --  1.8*  PROT 8.0 6.2*  --   --  6.0*  ALBUMIN 2.8* 2.2* 2.0* 2.0* 2.3*   No results for input(s): LIPASE, AMYLASE in the last 168 hours. CBC  Recent Labs Lab 09/04/15 1102 09/05/15 0059 09/05/15 1741 09/06/15 0337  WBC 17.3* 15.1* 13.5* 14.5*  NEUTROABS 15.7*  --   --   --   HGB 12.5* 10.7* 10.0* 10.9*  HCT 38.5* 32.6* 32.0* 34.3*  MCV 78.1* 78.1* 79.8 79.4  PLT 147* 82* 73* 84*   Basic Metabolic Panel  Recent Labs Lab 09/04/15 1102 09/04/15 1418 09/05/15 0059 09/05/15 1006 09/05/15 1317 09/05/15 1741 09/06/15 0337  NA 146* 148* 146* 143 144 144 146*  K 4.7 4.1 4.2 4.1 4.0 4.1 4.3  CL 112* 119* 117* 115* 115* 115* 119*  CO2 18* 15* 16* 13* 11* 14* 13*  GLUCOSE 344* 280* 270* 228* 220* 227* 291*  BUN 122* 108* 117* 126* 129* 125* 127*  CREATININE 9.15* 8.66* 9.08* 9.57* 8.94* 8.59* 8.00*  CALCIUM 9.4 8.4* 8.5* 8.9 8.4* 8.6* 8.6*  PHOS  --   --   --  5.6* 5.4* 6.0*  --    Iron/TIBC/Ferritin/ %Sat No results found for: IRON, TIBC, FERRITIN, IRONPCTSAT  Vitals:   09/06/15 0400 09/06/15 0500 09/06/15 0600 09/06/15 0700  BP: 100/64 93/62 99/62  (!) 98/59  Pulse: 72 (!) 59 65 64  Resp: Temp: 97.3 F (36.3 C) 97.2 F (36.2 C) 97.2 F (36.2 C) 97.5 F (36.4 C)  TempSrc:      SpO2: 99% 99% 100% 99%  Weight:       Exam Gen eyes partially open, blinks to threat,  does not respond verbally or follow commands No rash, cyanosis or gangrene Sclera anicteric, lips a bit dry No jvd or bruits Chest clear bilat RRR no MRG Abd soft ntnd no mass or ascites +bs GU normal male MS no joint effusions or deformity, L heel blood blister Ext no LE or UE edema Neuro is somnolent,  not speaking, R arm some rigidity and L arm almost flaccid Not moving legs  Assessment: 1  Acute renal failure due to ATN/ sepsis most likely.  Nonoliguric.  L sided hydro from ureteral stone / +UTI.  Looks somewhat dry on exam.  Not dialysis candidate due to severe and prolonged debility/ comorbidities.  Cont IVF's, poor candidate for aggressive therapy, would suggest palliative care involvement and conservative management given pt's chronic debility and poor QOL.  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

## 2015-09-06 NOTE — Progress Notes (Signed)
Inpatient Diabetes Program Recommendations  AACE/ADA: New Consensus Statement on Inpatient Glycemic Control (2015)  Target Ranges:  Prepandial:   less than 140 mg/dL      Peak postprandial:   less than 180 mg/dL (1-2 hours)      Critically ill patients:  140 - 180 mg/dL   Results for JARAY, HESSE (MRN [id number]) as of 09/06/2015 07:45  Ref. Range 09/05/2015 03:45 09/05/2015 07:37 09/05/2015 13:10 09/06/2015 01:07  Glucose-Capillary Latest Ref Range: 65 - 99 mg/dL 865 (H) 784 (H) 696 (H) 258 (H)   Results for EDSON, FRUTH (MRN [id number]) as of 09/06/2015 07:45  Ref. Range 09/06/2015 03:37  Glucose Latest Ref Range: 65 - 99 mg/dL 440 (H)    Admit with: ARF/ Sepsis/ Pneumonia  History: DM, Brain Tumor  Home DM Meds: Lantus 20 units QHS  Current Insulin Orders: Novolog Sensitive Correction Scale/ SSI (0-9 units) TID AC + HS      -Note patient received 60 mg Iv Solumedrol X 1 dose yesterday at 1pm.  -Glucose levels >200 mg/dl.  Takes Lantus at home.     MD- Please consider starting at least 50% of patient's home dose of Lantus:  Lantus 10 units QHS     --Will follow patient during hospitalization--  Ambrose Finland RN, MSN, CDE Diabetes Coordinator Inpatient Glycemic Control Team Team Pager: [phone] (8a-5p)

## 2015-09-06 NOTE — Progress Notes (Signed)
Subjective: Patient unable to provide  Objective: Vital signs in last 24 hours: Temp:  [97.2 F (36.2 C)-99 F (37.2 C)] 97.5 F (36.4 C) (07/25 0700) Pulse Rate:  [59-99] 64 (07/25 0700) Resp:  [11-26] 15 (07/25 0700) BP: (77-100)/(53-70) 98/59 (07/25 0700) SpO2:  [93 %-100 %] 99 % (07/25 0700) Weight:  [65 kg (143 lb 4.8 oz)] 65 kg (143 lb 4.8 oz) (07/24 1452)  Intake/Output from previous day: 07/24 0701 - 07/25 0700 In: 1066.3 [I.V.:861.3; IV Piggyback:205] Out: 500 [Urine:500] Intake/Output this shift: No intake/output data recorded.  Physical Exam:  Constitutional: Vital signs reviewed. WD WN in NAD   Eyes: PERRL, No scleral icterus.   Cardiovascular: RRR Pulmonary/Chest: Normal effort Abdominal: Soft. Non-tender, non-distended, bowel sounds are normal, no masses, organomegaly, or guarding present.  Genitourinary: Foley draining clear urine reasonable output Extremities: No cyanosis or edema   Lab Results:  Recent Labs  09/05/15 0059 09/05/15 1741 09/06/15 0337  HGB 10.7* 10.0* 10.9*  HCT 32.6* 32.0* 34.3*   BMET  Recent Labs  09/05/15 1741 09/06/15 0337  NA 144 146*  K 4.1 4.3  CL 115* 119*  CO2 14* 13*  GLUCOSE 227* 291*  BUN 125* 127*  CREATININE 8.59* 8.00*  CALCIUM 8.6* 8.6*    Recent Labs  09/06/15 0337  INR 1.48   No results for input(s): LABURIN in the last 72 hours. Results for orders placed or performed during the hospital encounter of 09/04/15  Blood Culture (routine x 2)     Status: None (Preliminary result)   Collection Time: 09/04/15 11:00 AM  Result Value Ref Range Status   Specimen Description BLOOD RIGHT ARM  Final   Special Requests BOTTLES DRAWN AEROBIC AND ANAEROBIC 5CC  Final   Culture NO GROWTH 2 DAYS  Final   Report Status PENDING  Incomplete  Blood Culture (routine x 2)     Status: None (Preliminary result)   Collection Time: 09/04/15 11:05 AM  Result Value Ref Range Status   Specimen Description BLOOD LEFT ARM   Final   Special Requests BOTTLES DRAWN AEROBIC AND ANAEROBIC 5CC  Final   Culture NO GROWTH 2 DAYS  Final   Report Status PENDING  Incomplete  Urine culture     Status: None   Collection Time: 09/04/15 11:25 AM  Result Value Ref Range Status   Specimen Description URINE, RANDOM  Final   Special Requests Normal  Final   Culture NO GROWTH Performed at [facility]   Final   Report Status 09/05/2015 FINAL  Final  MRSA PCR Screening     Status: None   Collection Time: 09/04/15  2:19 PM  Result Value Ref Range Status   MRSA by PCR NEGATIVE NEGATIVE Final    Comment:        The GeneXpert MRSA Assay (FDA approved for NASAL specimens only), is one component of a comprehensive MRSA colonization surveillance program. It is not intended to diagnose MRSA infection nor to guide or monitor treatment for MRSA infections.   C difficile quick scan w PCR reflex     Status: None   Collection Time: 09/05/15 10:11 AM  Result Value Ref Range Status   C Diff antigen NEGATIVE NEGATIVE Final   C Diff toxin NEGATIVE NEGATIVE Final   C Diff interpretation No C. difficile detected.  Final  Gastrointestinal Panel by PCR , Stool     Status: None   Collection Time: 09/05/15 10:11 AM  Result Value Ref Range Status  Campylobacter species NOT DETECTED NOT DETECTED Final   Plesimonas shigelloides NOT DETECTED NOT DETECTED Final   Salmonella species NOT DETECTED NOT DETECTED Final   Yersinia enterocolitica NOT DETECTED NOT DETECTED Final   Vibrio species NOT DETECTED NOT DETECTED Final   Vibrio cholerae NOT DETECTED NOT DETECTED Final   Enteroaggregative E coli (EAEC) NOT DETECTED NOT DETECTED Final   Enteropathogenic E coli (EPEC) NOT DETECTED NOT DETECTED Final   Enterotoxigenic E coli (ETEC) NOT DETECTED NOT DETECTED Final   Shiga like toxin producing E coli (STEC) NOT DETECTED NOT DETECTED Final   E. coli O157 NOT DETECTED NOT DETECTED Final   Shigella/Enteroinvasive E coli (EIEC) NOT  DETECTED NOT DETECTED Final   Cryptosporidium NOT DETECTED NOT DETECTED Final   Cyclospora cayetanensis NOT DETECTED NOT DETECTED Final   Entamoeba histolytica NOT DETECTED NOT DETECTED Final   Giardia lamblia NOT DETECTED NOT DETECTED Final   Adenovirus F40/41 NOT DETECTED NOT DETECTED Final   Astrovirus NOT DETECTED NOT DETECTED Final   Norovirus GI/GII NOT DETECTED NOT DETECTED Final   Rotavirus A NOT DETECTED NOT DETECTED Final   Sapovirus (I, II, IV, and V) NOT DETECTED NOT DETECTED Final    Studies/Results: Ct Abdomen Pelvis Wo Contrast  Result Date: 09/05/2015 CLINICAL DATA:  Left-sided hydronephrosis EXAM: CT ABDOMEN AND PELVIS WITHOUT CONTRAST TECHNIQUE: Multidetector CT imaging of the abdomen and pelvis was performed following the standard protocol without IV contrast. COMPARISON:  Renal ultrasound - 09/04/2015 FINDINGS: The lack of intravenous contrast limits the ability to evaluate solid abdominal organs. Examination is further degraded secondary to patient respiratory artifact. Lower chest: Limited visualization of the lower thorax demonstrates dependent subpleural ground-glass atelectasis. Airspace opacities are seen within the right lower lobe with associated air bronchograms. Additionally, there are ill-defined nodules seen within the right lower lobe which appear to demonstrate a tree-in-bud pattern. No pleural effusion. Normal heart size. No pericardial effusion. Decreased attenuation of the intra cardiac blood pool suggestive of anemia. Hepatobiliary: Normal hepatic contour. The gallbladder appears filled with radiopaque material suggestive of sludge. No definite ascites. Pancreas: Normal noncontrast appearance of the pancreas Spleen: Normal noncontrast appearance of the spleen. Incidental common of a small splenule. Adrenals/Urinary Tract: Note is made of a punctate (approximately 0.5 x 0.6 cm) stone within the superior aspects of the left ureter (axial image 55, series 2; coronal  image 64, series 602) which results in mild upstream ureterectasis and asymmetric left-sided pelvicaliectasis. No additional evidence of left-sided nephrolithiasis. Solitary punctate (approximately 2 mm) nonobstructing stone within the superior pole of the right kidney (image 40, series 2). No evidence of right-sided urinary obstruction. No renal stones are seen along expected course of the right ureter. The urinary bladder is decompressed with a Foley catheter. Several prominent phleboliths are seen within the left hemipelvis. Stomach/Bowel: A rectal catheter is in place. There is diffuse thickening of the rectal wall, potentially reactive. Moderate colonic stool burden without evidence of enteric obstruction. Normal noncontrast appearance of the terminal ileum. The appendix is not visualized, however there is no asymmetric right-sided very cecal inflammatory change. No pneumoperitoneum, pneumatosis or portal venous gas. Vascular/Lymphatic: Minimal amount of atherosclerotic plaque within a normal caliber abdominal aorta. A central venous catheter tip terminates within the peripheral aspects of the left common iliac vein. No definitive bulky retroperitoneal, mesenteric, pelvic or inguinal lymphadenopathy on this noncontrast examination. Reproductive: No evidence approximately on this noncontrast examination. No free fluid in the pelvic cul-de-sac. Other: Mild diffuse body wall anasarca. Musculoskeletal: No  acute or aggressive osseous abnormalities. Stigmata of DISH within the thoracic spine. IMPRESSION: 1. Punctate (approximately 0.6 cm) stone within the superior aspect of the left ureter results in very mild upstream left-sided ureterectasis and pelvicaliectasis. 2. Additional solitary punctate (approximately 2 mm) nonobstructing right-sided renal stone. No evidence of right-sided urinary obstruction. 3. Bibasilar atelectasis with potential developing airspace opacity within the right lower lobe. Clinical  correlation is advised. 4. Diffuse thickening of the rectal wall, potentially reactive secondary to placement of a rectal catheter though an enteritis could have a similar appearance. Clinical correlation is advised. No evidence of enteric obstruction. Electronically Signed   By: Simonne Come M.D.   On: 09/05/2015 21:21  Dg Chest 1 View  Result Date: 09/05/2015 CLINICAL DATA:  Unsuccessful central line placement. EXAM: CHEST 1 VIEW COMPARISON:  Radiograph of same day. FINDINGS: The heart size and mediastinal contours are within normal limits. No pleural effusion is noted. Right lung is clear. Possible pleural line is seen in left lung apex which may represent small apical pneumothorax. The visualized skeletal structures are unremarkable. IMPRESSION: Possible small left apical pneumothorax. Follow-up radiographs are recommended. Critical Value/emergent results were called by telephone at the time of interpretation on 09/05/2015 at 1:34 pm to Bennett Scrape, nurse practitioner, who verbally acknowledged these results and will immediately contact Dr. Nicholos Johns. Electronically Signed   By: Lupita Raider, M.D.   On: 09/05/2015 13:36  US Renal  Result Date: 09/04/2015 CLINICAL DATA:  Acute renal failure EXAM: RENAL / URINARY TRACT ULTRASOUND COMPLETE COMPARISON:  None. FINDINGS: Right Kidney: Length: 12.7 cm.  Slight increased cortical echogenicity Left Kidney: Length: 12.9 cm.  Mild hydronephrosis Bladder: The bladder is decompressed with a Foley catheter IMPRESSION: 1. Mild hydronephrosis on the left, age indeterminate. If there is concern for acute obstruction, CT scan may better evaluate. Electronically Signed   By: Gerome Sam III M.D   On: 09/04/2015 13:50  Dg Chest Port 1 View  Result Date: 09/06/2015 CLINICAL DATA:  Pneumothorax. EXAM: PORTABLE CHEST 1 VIEW COMPARISON:  09/05/2015 . FINDINGS: Mediastinum hilar structures normal. Mild right base subsegmental atelectasis and or infiltrate. Small  right pleural effusion. Heart size normal. IMPRESSION: Mild right base subsegmental atelectasis and or infiltrate. Right base pneumonia cannot be excluded. Similar finding noted on prior exam. Chest is otherwise stable. No pneumothorax. Electronically Signed   By: Maisie Fus  Register   On: 09/06/2015 07:15  Dg Chest Port 1 View  Result Date: 09/05/2015 CLINICAL DATA:  Chart states that there have been several attempts to place a "left" side IJ PICC line and there was concern for a possible pneumothorax. Pt has a medium sized bandage on right side of neck, suggesting that IJ PICC was attempted on right side. Ordering MD looked at image outside of patient's room directly after I took it. Hx of diabetes. Pt was not able to verbally respond. EXAM: PORTABLE CHEST 1 VIEW COMPARISON:  09/05/2015 at 12:52 a.m. FINDINGS: There is now no evidence of a left pneumothorax. The previously seen linear opacity near the left apex was likely an artifact. Mild opacity at the right lung base is consistent with atelectasis. Consider pneumonia if there are consistent clinical symptoms. Lungs otherwise clear. No pleural effusion. Heart, mediastinum and hila are unremarkable. IMPRESSION: 1. No evidence of a pneumothorax on the current study. 2. Right lung base opacity most likely atelectasis. Pneumonia is possible. Electronically Signed   By: Amie Portland M.D.   On: 09/05/2015 17:38  Portable Chest 1  View  Result Date: 09/05/2015 CLINICAL DATA:  Pneumonia EXAM: PORTABLE CHEST 1 VIEW COMPARISON:  September 04, 2015 FINDINGS: There is patchy airspace consolidation in the right base. There is new mild atelectatic change in the left base, possibly represent a second focus of early pneumonia. Lungs elsewhere clear. Heart is upper normal in size with pulmonary vascularity within normal limits. No adenopathy. No bone lesions. IMPRESSION: Patchy airspace opacity in the right apex consistent with a degree of pneumonia. Atelectasis is in the left  lower lobe, new ; question a second focus of pneumonia developing in the left base. Lungs elsewhere clear. Stable cardiac silhouette. Electronically Signed   By: Bretta Bang III M.D.   On: 09/05/2015 07:15  Dg Chest Port 1 View  Result Date: 09/04/2015 CLINICAL DATA:  Fever, probable sepsis, diabetic. Pt unable to give hx at this time EXAM: PORTABLE CHEST 1 VIEW COMPARISON:  07/10/2015 FINDINGS: Midline trachea. Normal heart size. Numerous leads and wires project over the chest. No pleural effusion or pneumothorax. Mild medial right lung base airspace disease is suspected. Clear left lung. IMPRESSION: Subtle medial right lung airspace disease. Although this could represent atelectasis, given the clinical history, suspicious for early infection. If possible, PA and lateral radiographs should be considered. Electronically Signed   By: Jeronimo Greaves M.D.   On: 09/04/2015 11:30   Assessment/Plan:   Sepsis and presumed genitourinary source. Urine culture remains negative this time. Patient did have abnormal urinalysis of the time of admission and does have some left hydronephrosis with a proximal stone. Interventional radiology has been consulted and the patient should undergo percutaneous nephrostomy tube for source control. Hopefully without will be done today.   LOS: 1 day   Alese Furniss S 09/06/2015, 8:18 AM

## 2015-09-06 NOTE — Consult Note (Signed)
Chief Complaint: Patient was seen in consultation today for left percutaneous nephrostomy  Referring Physician(s): Grapey,D  Supervising Physician: Irish Lack  Patient Status: Inpatient  History of Present Illness: Cory Salazar is a 55 y.o. male with past medical history of anxiety disorder, diabetes, encephalopathy, depression, seizure as well as resection of brain tumor recently transferred from [facility] secondary to acute renal failure, mild left hydronephrosis with presumed urosepsis, lethargy and pneumonia. CT abdomen /pelvis performed yesterday revealed a punctate 0.6 cm stone within the superior aspect of the left ureter resulting in mild left-sided hydronephrosis with additional nonobstructing right sided renal stone. He was evaluated by urology and request now received for left percutaneous nephrostomy tube placement. Creatinine currently 8.0 and WBC 14.5. He is currently afebrile.  Past Medical History:  Diagnosis Date  . Anxiety disorder   . Diabetes mellitus without complication (HCC)   . Encephalopathy   . MDD (major depressive disorder) (HCC)   . Seizure [facility])     History reviewed. No pertinent surgical history.  Allergies: Review of patient's allergies indicates no known allergies.  Medications: Prior to Admission medications   Medication Sig Start Date End Date Taking? Authorizing Provider  acetaminophen (TYLENOL) 325 MG tablet Take 650 mg by mouth every 4 (four) hours as needed for mild pain, moderate pain or fever.   Yes Historical Provider, MD  insulin glargine (LANTUS) 100 UNIT/ML injection Inject 0.2 mLs (20 Units total) into the skin at bedtime. 09/05/15  Yes Ramonita Lab, MD  ipratropium-albuterol (DUONEB) 0.5-2.5 (3) MG/3ML SOLN Take 3 mLs by nebulization 4 (four) times daily. 09/05/15  Yes Ramonita Lab, MD  lactulose (CHRONULAC) 10 GM/15ML solution Take 30 mLs (20 g total) by mouth 2 (two) times daily. 09/05/15  Yes Ramonita Lab, MD  lamoTRIgine (LAMICTAL) 100 MG tablet Take 1 tablet (100 mg total) by mouth 2 (two) times daily. 09/05/15  Yes Ramonita Lab, MD  levETIRAcetam 500 mg in sodium chloride 0.9 % 100 mL Inject 500 mg into the vein every 12 (twelve) hours. 09/05/15  Yes Ramonita Lab, MD  LORazepam (ATIVAN) 2 MG/ML injection Inject 0.5 mLs (1 mg total) into the vein every 4 (four) hours as needed for seizure. 09/05/15  Yes Ramonita Lab, MD  mirtazapine (REMERON) 15 MG tablet Take 1 tablet (15 mg total) by mouth at bedtime. 09/05/15  Yes Ramonita Lab, MD  phenytoin (DILANTIN) 50 MG tablet Chew 6 tablets (300 mg total) by mouth at bedtime. 07/10/15 07/09/16 Yes Jeanmarie Plant, MD  piperacillin-tazobactam (ZOSYN) 3.375 GM/50ML IVPB Inject 50 mLs (3.375 g total) into the vein every 8 (eight) hours. 09/05/15  Yes Ramonita Lab, MD  potassium chloride SA (K-DUR,KLOR-CON) 20 MEQ tablet Take 20 mEq by mouth 2 (two) times daily.   Yes Historical Provider, MD  QUEtiapine (SEROQUEL) 200 MG tablet Take 200 mg by mouth 2 (two) times daily. **Medication on hold from 09/01/15 to 09/04/15 per MD at [facility]**   Yes Historical Provider, MD  sertraline (ZOLOFT) 50 MG tablet Take 50 mg by mouth daily.   Yes Historical Provider, MD  sodium chloride 0.45 % solution Inject 100 mLs into the vein continuous. 09/05/15  Yes Ramonita Lab, MD  sodium chloride flush (NS) 0.9 % SOLN Inject 3 mLs into the vein every 12 (twelve) hours. 09/05/15  Yes Ramonita Lab, MD  tamsulosin (FLOMAX) 0.4 MG CAPS capsule Take 0.4 mg by mouth daily.   Yes Historical Provider, MD  History reviewed. No pertinent family history.  Social History   Social History  . Marital status: Single    Spouse name: N/A  . Number of children: N/A  . Years of education: N/A   Social History Main Topics  . Smoking status: Unknown If Ever Smoked  . Smokeless tobacco: Never Used  . Alcohol use No  . Drug use: No  . Sexual activity: No   Other Topics Concern  . None     Social History Narrative  . None      Review of Systems unable to obtain history from patient  Vital Signs: BP (!) 98/59   Pulse 64   Temp 97.5 F (36.4 C)   Resp 15   Wt 143 lb 4.8 oz (65 kg)   SpO2 99%   BMI 18.91 kg/m   Physical Exam patient awake/eyes open but does not respond to questions; chest with diminished breath sounds right base, left clear. Heart with regular rate and rhythm. Abdomen soft, positive bowel sounds, no distinct tenderness. Lower extremities with no significant edema.  Mallampati Score:     Imaging: Ct Abdomen Pelvis Wo Contrast  Result Date: 09/05/2015 CLINICAL DATA:  Left-sided hydronephrosis EXAM: CT ABDOMEN AND PELVIS WITHOUT CONTRAST TECHNIQUE: Multidetector CT imaging of the abdomen and pelvis was performed following the standard protocol without IV contrast. COMPARISON:  Renal ultrasound - 09/04/2015 FINDINGS: The lack of intravenous contrast limits the ability to evaluate solid abdominal organs. Examination is further degraded secondary to patient respiratory artifact. Lower chest: Limited visualization of the lower thorax demonstrates dependent subpleural ground-glass atelectasis. Airspace opacities are seen within the right lower lobe with associated air bronchograms. Additionally, there are ill-defined nodules seen within the right lower lobe which appear to demonstrate a tree-in-bud pattern. No pleural effusion. Normal heart size. No pericardial effusion. Decreased attenuation of the intra cardiac blood pool suggestive of anemia. Hepatobiliary: Normal hepatic contour. The gallbladder appears filled with radiopaque material suggestive of sludge. No definite ascites. Pancreas: Normal noncontrast appearance of the pancreas Spleen: Normal noncontrast appearance of the spleen. Incidental common of a small splenule. Adrenals/Urinary Tract: Note is made of a punctate (approximately 0.5 x 0.6 cm) stone within the superior aspects of the left ureter (axial  image 55, series 2; coronal image 64, series 602) which results in mild upstream ureterectasis and asymmetric left-sided pelvicaliectasis. No additional evidence of left-sided nephrolithiasis. Solitary punctate (approximately 2 mm) nonobstructing stone within the superior pole of the right kidney (image 40, series 2). No evidence of right-sided urinary obstruction. No renal stones are seen along expected course of the right ureter. The urinary bladder is decompressed with a Foley catheter. Several prominent phleboliths are seen within the left hemipelvis. Stomach/Bowel: A rectal catheter is in place. There is diffuse thickening of the rectal wall, potentially reactive. Moderate colonic stool burden without evidence of enteric obstruction. Normal noncontrast appearance of the terminal ileum. The appendix is not visualized, however there is no asymmetric right-sided very cecal inflammatory change. No pneumoperitoneum, pneumatosis or portal venous gas. Vascular/Lymphatic: Minimal amount of atherosclerotic plaque within a normal caliber abdominal aorta. A central venous catheter tip terminates within the peripheral aspects of the left common iliac vein. No definitive bulky retroperitoneal, mesenteric, pelvic or inguinal lymphadenopathy on this noncontrast examination. Reproductive: No evidence approximately on this noncontrast examination. No free fluid in the pelvic cul-de-sac. Other: Mild diffuse body wall anasarca. Musculoskeletal: No acute or aggressive osseous abnormalities. Stigmata of DISH within the thoracic spine. IMPRESSION: 1. Punctate (approximately  0.6 cm) stone within the superior aspect of the left ureter results in very mild upstream left-sided ureterectasis and pelvicaliectasis. 2. Additional solitary punctate (approximately 2 mm) nonobstructing right-sided renal stone. No evidence of right-sided urinary obstruction. 3. Bibasilar atelectasis with potential developing airspace opacity within the right  lower lobe. Clinical correlation is advised. 4. Diffuse thickening of the rectal wall, potentially reactive secondary to placement of a rectal catheter though an enteritis could have a similar appearance. Clinical correlation is advised. No evidence of enteric obstruction. Electronically Signed   By: Simonne Come M.D.   On: 09/05/2015 21:21  Dg Chest 1 View  Result Date: 09/05/2015 CLINICAL DATA:  Unsuccessful central line placement. EXAM: CHEST 1 VIEW COMPARISON:  Radiograph of same day. FINDINGS: The heart size and mediastinal contours are within normal limits. No pleural effusion is noted. Right lung is clear. Possible pleural line is seen in left lung apex which may represent small apical pneumothorax. The visualized skeletal structures are unremarkable. IMPRESSION: Possible small left apical pneumothorax. Follow-up radiographs are recommended. Critical Value/emergent results were called by telephone at the time of interpretation on 09/05/2015 at 1:34 pm to Bennett Scrape, nurse practitioner, who verbally acknowledged these results and will immediately contact Dr. Nicholos Johns. Electronically Signed   By: Lupita Raider, M.D.   On: 09/05/2015 13:36  US Renal  Result Date: 09/04/2015 CLINICAL DATA:  Acute renal failure EXAM: RENAL / URINARY TRACT ULTRASOUND COMPLETE COMPARISON:  None. FINDINGS: Right Kidney: Length: 12.7 cm.  Slight increased cortical echogenicity Left Kidney: Length: 12.9 cm.  Mild hydronephrosis Bladder: The bladder is decompressed with a Foley catheter IMPRESSION: 1. Mild hydronephrosis on the left, age indeterminate. If there is concern for acute obstruction, CT scan may better evaluate. Electronically Signed   By: Gerome Sam III M.D   On: 09/04/2015 13:50  Dg Chest Port 1 View  Result Date: 09/06/2015 CLINICAL DATA:  Pneumothorax. EXAM: PORTABLE CHEST 1 VIEW COMPARISON:  09/05/2015 . FINDINGS: Mediastinum hilar structures normal. Mild right base subsegmental atelectasis and  or infiltrate. Small right pleural effusion. Heart size normal. IMPRESSION: Mild right base subsegmental atelectasis and or infiltrate. Right base pneumonia cannot be excluded. Similar finding noted on prior exam. Chest is otherwise stable. No pneumothorax. Electronically Signed   By: Maisie Fus  Register   On: 09/06/2015 07:15  Dg Chest Port 1 View  Result Date: 09/05/2015 CLINICAL DATA:  Chart states that there have been several attempts to place a "left" side IJ PICC line and there was concern for a possible pneumothorax. Pt has a medium sized bandage on right side of neck, suggesting that IJ PICC was attempted on right side. Ordering MD looked at image outside of patient's room directly after I took it. Hx of diabetes. Pt was not able to verbally respond. EXAM: PORTABLE CHEST 1 VIEW COMPARISON:  09/05/2015 at 12:52 a.m. FINDINGS: There is now no evidence of a left pneumothorax. The previously seen linear opacity near the left apex was likely an artifact. Mild opacity at the right lung base is consistent with atelectasis. Consider pneumonia if there are consistent clinical symptoms. Lungs otherwise clear. No pleural effusion. Heart, mediastinum and hila are unremarkable. IMPRESSION: 1. No evidence of a pneumothorax on the current study. 2. Right lung base opacity most likely atelectasis. Pneumonia is possible. Electronically Signed   By: Amie Portland M.D.   On: 09/05/2015 17:38  Portable Chest 1 View  Result Date: 09/05/2015 CLINICAL DATA:  Pneumonia EXAM: PORTABLE CHEST 1 VIEW COMPARISON:  September 04, 2015 FINDINGS: There is patchy airspace consolidation in the right base. There is new mild atelectatic change in the left base, possibly represent a second focus of early pneumonia. Lungs elsewhere clear. Heart is upper normal in size with pulmonary vascularity within normal limits. No adenopathy. No bone lesions. IMPRESSION: Patchy airspace opacity in the right apex consistent with a degree of pneumonia.  Atelectasis is in the left lower lobe, new ; question a second focus of pneumonia developing in the left base. Lungs elsewhere clear. Stable cardiac silhouette. Electronically Signed   By: Bretta Bang III M.D.   On: 09/05/2015 07:15  Dg Chest Port 1 View  Result Date: 09/04/2015 CLINICAL DATA:  Fever, probable sepsis, diabetic. Pt unable to give hx at this time EXAM: PORTABLE CHEST 1 VIEW COMPARISON:  07/10/2015 FINDINGS: Midline trachea. Normal heart size. Numerous leads and wires project over the chest. No pleural effusion or pneumothorax. Mild medial right lung base airspace disease is suspected. Clear left lung. IMPRESSION: Subtle medial right lung airspace disease. Although this could represent atelectasis, given the clinical history, suspicious for early infection. If possible, PA and lateral radiographs should be considered. Electronically Signed   By: Jeronimo Greaves M.D.   On: 09/04/2015 11:30   Labs:  CBC:  Recent Labs  09/04/15 1102 09/05/15 0059 09/05/15 1741 09/06/15 0337  WBC 17.3* 15.1* 13.5* 14.5*  HGB 12.5* 10.7* 10.0* 10.9*  HCT 38.5* 32.6* 32.0* 34.3*  PLT 147* 82* 73* 84*    COAGS:  Recent Labs  09/06/15 0337  INR 1.48    BMP:  Recent Labs  09/05/15 1006 09/05/15 1317 09/05/15 1741 09/06/15 0337  NA 143 144 144 146*  K 4.1 4.0 4.1 4.3  CL 115* 115* 115* 119*  CO2 13* 11* 14* 13*  GLUCOSE 228* 220* 227* 291*  BUN 126* 129* 125* 127*  CALCIUM 8.9 8.4* 8.6* 8.6*  CREATININE 9.57* 8.94* 8.59* 8.00*  GFRNONAA 5* 6* 6* 7*  GFRAA 6* 7* 7* 8*    LIVER FUNCTION TESTS:  Recent Labs  07/10/15 1231 09/04/15 1102 09/05/15 0059 09/05/15 1006 09/05/15 1317 09/06/15 0337  BILITOT 0.4 3.1* 3.5*  --   --  1.8*  AST 27 30 24   --   --  11*  ALT 23 20 17   --   --  15*  ALKPHOS 141* 175* 120  --   --  83  PROT 8.1 8.0 6.2*  --   --  6.0*  ALBUMIN 3.9 2.8* 2.2* 2.0* 2.0* 2.3*    TUMOR MARKERS: No results for input(s): AFPTM, CEA, CA199, CHROMGRNA  in the last 8760 hours.  Assessment and Plan: 55 y.o. male with past medical history of anxiety disorder, diabetes, encephalopathy, depression, seizure as well as resection of brain tumor recently transferred from [facility]SNF secondary to acute renal failure, mild left hydronephrosis with presumed urosepsis, lethargy and pneumonia. CT abdomen /pelvis performed yesterday revealed a punctate 0.6 cm stone within the superior aspect of the left ureter resulting in mild left-sided hydronephrosis with additional nonobstructing right sided renal stone. He was evaluated by urology and request now received for left percutaneous nephrostomy tube placement. Creatinine currently 8.0 and WBC 14.5. He is currently afebrile. Additional labs include PT 18.0, INR 1.48, hemoglobin 10.9 ,platelets 84k. BP a little soft at 98/59.  Imaging studies have been reviewed by Dr. Fredia Sorrow and case discussed with Dr. Isabel Caprice. Risks and benefits discussed with the patient's sister, [name],  including, but  not limited to infection, bleeding, significant bleeding causing loss or decrease in renal function or damage to adjacent structures. All of the patient's questions were answered, patient's sister is agreeable to proceed. Consent signed and in chart. Procedure tentatively scheduled for later today.     Thank you for this interesting consult.  I greatly enjoyed meeting Chen Coxe and look forward to participating in their care.  A copy of this report was sent to the requesting provider on this date.  Electronically Signed: D. Jeananne Rama 09/06/2015, 9:14 AM   I spent a total of  30 minutes   in face to face in clinical consultation, greater than 50% of which was counseling/coordinating care for left percutaneous nephrostomy

## 2015-09-06 NOTE — Progress Notes (Addendum)
Initial Nutrition Assessment  DOCUMENTATION CODES:   Non-severe (moderate) malnutrition in context of chronic illness  INTERVENTION:  - Diet advancement per SLP.  - RD will follow-up 7/27, or earlier if nutrition support is needed.  NUTRITION DIAGNOSIS:   Inadequate oral intake related to inability to eat as evidenced by NPO status.  GOAL:   Patient will meet greater than or equal to 90% of their needs  MONITOR:   Diet advancement, Weight trends, Labs, Skin, I & O's  REASON FOR ASSESSMENT:   Low Braden    ASSESSMENT:   55 y.o. 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 In ER, pt was tachycardic and febrile. WBC=17k. CXR shows right-sided pneumonia. Pt was transitioned to [facility] for further evaluation and recommendations given limitations of care at [place]. Urology is aware and critical care consulted. Patient will be admitted to step down for further care. Unable to obtain history from patient.  Pt seen for low Braden. BMI indicates normal weight. Pt transferred from [facility]. He was previously on Renal/Carb Modified diet with no PO intakes documented; pt was changed to NPO status when he was unable to safely take PO medications and SLP at [facility] began to follow pt. Will monitor for SLP evaluation since transfer.  No family/visitors present at time of RD visit. Pt was nonverbal during RD visit and information was unable to be obtained at this time. Notes indicate pt with dementia and hx of brain tumor s/p resection.   HD catheter was placed 7/24 with plan to initiate CRRT. Estimated nutrition needs are based on this POC and adjusted body weight used as pt's CBW is <95% IBW.   Physical assessment shows no edema or fat wasting, moderate muscle wasting is present to upper and lower body. Per chart review, pt has lost 52 lbs (27% body weight) in the past 2  months which is significant for time frame; will monitor for weight trends during admission. No weights available in chart other than CBW and weight on 07/10/15.  Medications reviewed; sliding scale Novolog, 500 mg IV Keppra, 300 mg Dilantin/day. Labs reviewed; CBGs: 245 and 258 mg/dL this AM, Na: 248 mmol/L, Cl: 119 mmol/L, BUN: 127 mg/dL, creatinine: 8 mg/dL, Ca: 8.6 mg/dL, Phos: 6 mg/dL, Mg: 3 mg/dL, GFR: 7 mL/min.  IVF: D5 @ 75 mL/hr (306 kcal).   Diet Order:  Diet NPO time specified Except for: Sips with Meds  Skin:  Wound (see comment) (Stage 1 L heel pressure injury, DTIs to bilateral buttocks)  Last BM:  7/24  Height:   Ht Readings from Last 1 Encounters:  09/04/15 6\' 1"  (1.854 m)    Weight:   Wt Readings from Last 1 Encounters:  09/05/15 143 lb 4.8 oz (65 kg)    Ideal Body Weight:  83.64 kg (kg)  BMI:  Body mass index is 18.91 kg/m.  Estimated Nutritional Needs:   Kcal:  2398 (35 kcal/kg adjusted body weight of 68.5 kg)  Protein:  82-103 grams (1.2-1.5 kcal/kg adjusted body weight of 68.5 kg)  Fluid:  2 L/day  EDUCATION NEEDS:   No education needs identified at this time    Trenton Gammon, MS, RD, LDN Inpatient Clinical Dietitian Pager # [phone] After hours/weekend pager # [phone]

## 2015-09-06 NOTE — Progress Notes (Addendum)
PULMONARY / CRITICAL CARE MEDICINE   Name: Cory Salazar MRN: [id number] DOB: 1960/10/21    ADMISSION DATE:  09/05/2015 CONSULTATION DATE:  09/05/15  REFERRING MD: Amado Coe, A  CHIEF COMPLAINT:  Acute Renal Failure  Brief: 55 y/o male admitted to [facility] on 7/24 with fever, possible pneumonia and AKI.  Baseline "encephalopathy" post treatment for brain cancer.  He was treated with antibiotics but due to ongoing renal failure PCCM was consulted for possible CVVHD. He was transferred to [facility] due to an infrastructure emergency at [facility].   SUBJECTIVE:  No acute events, Cr improving slowly, didn't get percutaneous drain yesterday  VITAL SIGNS: BP (!) 98/59   Pulse 64   Temp 97.5 F (36.4 C)   Resp 15   Wt 143 lb 4.8 oz (65 kg)   SpO2 99%   BMI 18.91 kg/m   HEMODYNAMICS:    VENTILATOR SETTINGS:    INTAKE / OUTPUT: I/O last 3 completed shifts: In: 1066.3 [I.V.:861.3; IV Piggyback:205] Out: 500 [Urine:500]  PHYSICAL EXAMINATION: General:  No distress on room air HENT: NCAT OP clear PULM: CTA B, normal effort CV: RRR, no mgr GI: BS+, soft, nontender MSK: normal bulk and tone Neuro: Eyes open, tracks, doesn't follow commands  LABS:  BMET  Recent Labs Lab 09/05/15 1317 09/05/15 1741 09/06/15 0337  NA 144 144 146*  K 4.0 4.1 4.3  CL 115* 115* 119*  CO2 11* 14* 13*  BUN 129* 125* 127*  CREATININE 8.94* 8.59* 8.00*  GLUCOSE 220* 227* 291*    Electrolytes  Recent Labs Lab 09/05/15 1006 09/05/15 1317 09/05/15 1741 09/06/15 0337  CALCIUM 8.9 8.4* 8.6* 8.6*  MG  --   --  3.0*  --   PHOS 5.6* 5.4* 6.0*  --     CBC  Recent Labs Lab 09/05/15 0059 09/05/15 1741 09/06/15 0337  WBC 15.1* 13.5* 14.5*  HGB 10.7* 10.0* 10.9*  HCT 32.6* 32.0* 34.3*  PLT 82* 73* 84*    Coag's  Recent Labs Lab 09/06/15 0337  INR 1.48    Sepsis Markers  Recent Labs Lab 09/04/15 1105 09/04/15 1418 09/05/15 0059  LATICACIDVEN 1.9 2.9* 1.1    ABG  Recent Labs Lab  09/04/15 1102  PHART 7.30*  PCO2ART 35  PO2ART 65*    Liver Enzymes  Recent Labs Lab 09/04/15 1102 09/05/15 0059 09/05/15 1006 09/05/15 1317 09/06/15 0337  AST 30 24  --   --  11*  ALT 20 17  --   --  15*  ALKPHOS 175* 120  --   --  83  BILITOT 3.1* 3.5*  --   --  1.8*  ALBUMIN 2.8* 2.2* 2.0* 2.0* 2.3*    Cardiac Enzymes No results for input(s): TROPONINI, PROBNP in the last 168 hours.  Glucose  Recent Labs Lab 09/04/15 2337 09/05/15 0345 09/05/15 0737 09/05/15 1310 09/06/15 0107 09/06/15 0736  GLUCAP 269* 209* 201* 202* 258* 245*    Imaging Ct Abdomen Pelvis Wo Contrast  Result Date: 09/05/2015 CLINICAL DATA:  Left-sided hydronephrosis EXAM: CT ABDOMEN AND PELVIS WITHOUT CONTRAST TECHNIQUE: Multidetector CT imaging of the abdomen and pelvis was performed following the standard protocol without IV contrast. COMPARISON:  Renal ultrasound - 09/04/2015 FINDINGS: The lack of intravenous contrast limits the ability to evaluate solid abdominal organs. Examination is further degraded secondary to patient respiratory artifact. Lower chest: Limited visualization of the lower thorax demonstrates dependent subpleural ground-glass atelectasis. Airspace opacities are seen within the right lower lobe with associated air bronchograms. Additionally,  there are ill-defined nodules seen within the right lower lobe which appear to demonstrate a tree-in-bud pattern. No pleural effusion. Normal heart size. No pericardial effusion. Decreased attenuation of the intra cardiac blood pool suggestive of anemia. Hepatobiliary: Normal hepatic contour. The gallbladder appears filled with radiopaque material suggestive of sludge. No definite ascites. Pancreas: Normal noncontrast appearance of the pancreas Spleen: Normal noncontrast appearance of the spleen. Incidental common of a small splenule. Adrenals/Urinary Tract: Note is made of a punctate (approximately 0.5 x 0.6 cm) stone within the superior  aspects of the left ureter (axial image 55, series 2; coronal image 64, series 602) which results in mild upstream ureterectasis and asymmetric left-sided pelvicaliectasis. No additional evidence of left-sided nephrolithiasis. Solitary punctate (approximately 2 mm) nonobstructing stone within the superior pole of the right kidney (image 40, series 2). No evidence of right-sided urinary obstruction. No renal stones are seen along expected course of the right ureter. The urinary bladder is decompressed with a Foley catheter. Several prominent phleboliths are seen within the left hemipelvis. Stomach/Bowel: A rectal catheter is in place. There is diffuse thickening of the rectal wall, potentially reactive. Moderate colonic stool burden without evidence of enteric obstruction. Normal noncontrast appearance of the terminal ileum. The appendix is not visualized, however there is no asymmetric right-sided very cecal inflammatory change. No pneumoperitoneum, pneumatosis or portal venous gas. Vascular/Lymphatic: Minimal amount of atherosclerotic plaque within a normal caliber abdominal aorta. A central venous catheter tip terminates within the peripheral aspects of the left common iliac vein. No definitive bulky retroperitoneal, mesenteric, pelvic or inguinal lymphadenopathy on this noncontrast examination. Reproductive: No evidence approximately on this noncontrast examination. No free fluid in the pelvic cul-de-sac. Other: Mild diffuse body wall anasarca. Musculoskeletal: No acute or aggressive osseous abnormalities. Stigmata of DISH within the thoracic spine. IMPRESSION: 1. Punctate (approximately 0.6 cm) stone within the superior aspect of the left ureter results in very mild upstream left-sided ureterectasis and pelvicaliectasis. 2. Additional solitary punctate (approximately 2 mm) nonobstructing right-sided renal stone. No evidence of right-sided urinary obstruction. 3. Bibasilar atelectasis with potential developing  airspace opacity within the right lower lobe. Clinical correlation is advised. 4. Diffuse thickening of the rectal wall, potentially reactive secondary to placement of a rectal catheter though an enteritis could have a similar appearance. Clinical correlation is advised. No evidence of enteric obstruction. Electronically Signed   By: Simonne Come M.D.   On: 09/05/2015 21:21  Dg Chest 1 View  Result Date: 09/05/2015 CLINICAL DATA:  Unsuccessful central line placement. EXAM: CHEST 1 VIEW COMPARISON:  Radiograph of same day. FINDINGS: The heart size and mediastinal contours are within normal limits. No pleural effusion is noted. Right lung is clear. Possible pleural line is seen in left lung apex which may represent small apical pneumothorax. The visualized skeletal structures are unremarkable. IMPRESSION: Possible small left apical pneumothorax. Follow-up radiographs are recommended. Critical Value/emergent results were called by telephone at the time of interpretation on 09/05/2015 at 1:34 pm to Bennett Scrape, nurse practitioner, who verbally acknowledged these results and will immediately contact Dr. Nicholos Johns. Electronically Signed   By: Lupita Raider, M.D.   On: 09/05/2015 13:36  Dg Chest Port 1 View  Result Date: 09/06/2015 CLINICAL DATA:  Pneumothorax. EXAM: PORTABLE CHEST 1 VIEW COMPARISON:  09/05/2015 . FINDINGS: Mediastinum hilar structures normal. Mild right base subsegmental atelectasis and or infiltrate. Small right pleural effusion. Heart size normal. IMPRESSION: Mild right base subsegmental atelectasis and or infiltrate. Right base pneumonia cannot be excluded. Similar finding  noted on prior exam. Chest is otherwise stable. No pneumothorax. Electronically Signed   By: Maisie Fus  Register   On: 09/06/2015 07:15  Dg Chest Port 1 View  Result Date: 09/05/2015 CLINICAL DATA:  Chart states that there have been several attempts to place a "left" side IJ PICC line and there was concern for a  possible pneumothorax. Pt has a medium sized bandage on right side of neck, suggesting that IJ PICC was attempted on right side. Ordering MD looked at image outside of patient's room directly after I took it. Hx of diabetes. Pt was not able to verbally respond. EXAM: PORTABLE CHEST 1 VIEW COMPARISON:  09/05/2015 at 12:52 a.m. FINDINGS: There is now no evidence of a left pneumothorax. The previously seen linear opacity near the left apex was likely an artifact. Mild opacity at the right lung base is consistent with atelectasis. Consider pneumonia if there are consistent clinical symptoms. Lungs otherwise clear. No pleural effusion. Heart, mediastinum and hila are unremarkable. IMPRESSION: 1. No evidence of a pneumothorax on the current study. 2. Right lung base opacity most likely atelectasis. Pneumonia is possible. Electronically Signed   By: Amie Portland M.D.   On: 09/05/2015 17:38    STUDIES:  7/23 renal ultrasound>>. Mild hydronephrosis on the left 7/24 CT abdomen/pelvis >> small superior uretal stone in with mild pelviectasis, additional non-obstructing stone on R, rectal wall thickening, bibasilar atelectasis  CULTURES: 7/23 blood cultures>> NGTD 7/23 urine > NGTD 7/24 C. Difficile>> negative  ANTIBIOTICS: 7/23 Zosyn>>   7/23 vancomycin >> 7/24   SIGNIFICANT EVENTS: 7/23 patient admitted to the ICU with right-sided pneumonia and renal failure requiring CRRT  LINES/TUBES: 7/24 left femoral>>  DISCUSSION: 55 year old with diabetes mellitus, encephalopathy, brain tumor status post resection, major depressive disorder and seizures, presenting with right-sided pneumonia and renal failure.  He also has a UTI with hydronephrosis.  Shock resolved, awaiting percutaneous nephrostomy tube.  AKI improving.  ASSESSMENT / PLAN:  PULMONARY A: HCAP > aspiration pneumonia? No evidence of pneumothorax P:   Monitor respiratory status SLP evaluation for aspiration CXR prn Aspiration  precautions  CARDIOVASCULAR A:  No active issues P:  Continuous telemetry Keep MAP>65  RENAL A:   Acute renal failure > improving slowly with IVF > appears dry Left-sided hydronephrosis P:   Will consult renal Would continue IVF  GASTROINTESTINAL A:   No active issues P:   Soft diet Continue Pepcid   HEMATOLOGIC A:   Thrombocytopenia P:  Heparin for DVT prophylaxis SCD Will transfuse if Hgb <7   INFECTIOUS A:    HCAP > improved Urinary tract infection with hydronephrosis P:   Will change to augmentin, would plan for 5 total days of antibiotics Monitor fever curve Follow cultures  ENDOCRINE A:   Diabetes mellitus type 2 P:   Blood sugar checks q4 hour SSI coverage  NEUROLOGIC A:   History of encephalopathy,S/P brain tumor resection History of MDD History of seizures P:   Continue Sheralyn Boatman /lamictal Dilantin per pharmacy Continue Zoloft/Remeron     Heber Nettleton, MD [place] [facility] Pager: [phone] Cell: [phone] After 3pm or if no response, call [phone]    09/06/2015, 9:21 AM

## 2015-09-06 NOTE — Progress Notes (Signed)
PROGRESS NOTE    Cory Salazar  YNW:[id number] DOB: 13-Feb-1960 DOA: 09/05/2015 PCP: No primary care provider on file.    Brief Narrative:  55 y.o. 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 In ER, pt was tachycardic and febrile. WBC=17k. CXR shows right-sided pneumonia. Pt was transitioned to [facility] for further evaluation and recommendations given limitations of care at [facility]. Urology is aware and critical care consulted. Patient will be admitted to step down for further care. Unable to obtain history from patient. IR consulted for potential percutaneous nephrostomy tube placement.   Assessment & Plan:   Sepsis (HCC) - Will place on broad spectrum antibiotics  - consulted critical care team as well  Left hydronephrosis - Discussed with Urology who is recommending CT scan of abdomen and pelvis non contrast.  - Also they are recommending IR consult for placement of drain    HCAP (healthcare-associated pneumonia) - Consulted pharmacy for Vanc and Zosyn renal dosing.    ARF (acute renal failure) (HCC) -  Mild hydronephrosis left side.  - CT scan to better evaluate if there is acute obstruction which is most likely - Consulted IR for consideration of percutaneous nephrostomy tube placement.    Seizure disorder (HCC)  - continue home medication regimen  Of note x ray reported apical pneumothorax on the left side. Critical Care team consulted. They are uncertain if it truly represents a pneumothorax and mention that if it is a true pneumothorax that it could be managed conservatively.  DVT prophylaxis: heparin Code Status: Full Family Communication: none at bedside Disposition Plan: Per specialist. Ct scan to evaluate for blockage, awaiting IR consultation for possible percutaneous nephrostomy tube placement.   Consultants:   Urology  IR  Critical care  team.    Antimicrobials: Vancomycin and Zosyn   Subjective: Pt gazes in my general direction but does not verbally interact with examiner.   Objective: Vitals:   09/06/15 0400 09/06/15 0500 09/06/15 0600 09/06/15 0700  BP: 100/64 93/62 99/62  (!) 98/59  Pulse: 72 (!) 59 65 64  Resp: 13 11 12 15   Temp: 97.3 F (36.3 C) 97.2 F (36.2 C) 97.2 F (36.2 C) 97.5 F (36.4 C)  TempSrc:      SpO2: 99% 99% 100% 99%  Weight:        Intake/Output Summary (Last 24 hours) at 09/06/15 0749 Last data filed at 09/06/15 6578  Gross per 24 hour  Intake          1066.25 ml  Output              500 ml  Net           566.25 ml   Filed Weights   09/05/15 1452  Weight: 65 kg (143 lb 4.8 oz)    Examination:  General exam: Appears calm and comfortable  Respiratory system: Clear to auscultation. Respiratory effort normal. Cardiovascular system: S1 & S2 heard, RRR. No JVD, murmurs, rubs, gallops or clicks.  Gastrointestinal system: Abdomen is nondistended, soft and nontender. No organomegaly or masses felt. Normal bowel sounds heard. Central nervous system: limited cooperation, no facial asymmetry. Extremities: Symmetric 5 x 5 power. Skin: No rashes, lesions or ulcers on limited exam. Psychiatry: unable to assess due to limited cooperation    Data Reviewed: I have personally reviewed following labs and imaging studies  CBC:  Recent Labs Lab 09/04/15 1102 09/05/15 0059 09/05/15 1741 09/06/15  0337  WBC 17.3* 15.1* 13.5* 14.5*  NEUTROABS 15.7*  --   --   --   HGB 12.5* 10.7* 10.0* 10.9*  HCT 38.5* 32.6* 32.0* 34.3*  MCV 78.1* 78.1* 79.8 79.4  PLT 147* 82* 73* 84*   Basic Metabolic Panel:  Recent Labs Lab 09/05/15 0059 09/05/15 1006 09/05/15 1317 09/05/15 1741 09/06/15 0337  NA 146* 143 144 144 146*  K 4.2 4.1 4.0 4.1 4.3  CL 117* 115* 115* 115* 119*  CO2 16* 13* 11* 14* 13*  GLUCOSE 270* 228* 220* 227* 291*  BUN 117* 126* 129* 125* 127*  CREATININE 9.08* 9.57* 8.94*  8.59* 8.00*  CALCIUM 8.5* 8.9 8.4* 8.6* 8.6*  MG  --   --   --  3.0*  --   PHOS  --  5.6* 5.4* 6.0*  --    GFR: Estimated Creatinine Clearance: 9.8 mL/min (by C-G formula based on SCr of 8 mg/dL). Liver Function Tests:  Recent Labs Lab 09/04/15 1102 09/05/15 0059 09/05/15 1006 09/05/15 1317 09/06/15 0337  AST 30 24  --   --  11*  ALT 20 17  --   --  15*  ALKPHOS 175* 120  --   --  83  BILITOT 3.1* 3.5*  --   --  1.8*  PROT 8.0 6.2*  --   --  6.0*  ALBUMIN 2.8* 2.2* 2.0* 2.0* 2.3*   No results for input(s): LIPASE, AMYLASE in the last 168 hours. No results for input(s): AMMONIA in the last 168 hours. Coagulation Profile:  Recent Labs Lab 09/06/15 0337  INR 1.48   Cardiac Enzymes: No results for input(s): CKTOTAL, CKMB, CKMBINDEX, TROPONINI in the last 168 hours. BNP (last 3 results) No results for input(s): PROBNP in the last 8760 hours. HbA1C: No results for input(s): HGBA1C in the last 72 hours. CBG:  Recent Labs Lab 09/04/15 2337 09/05/15 0345 09/05/15 0737 09/05/15 1310 09/06/15 0107  GLUCAP 269* 209* 201* 202* 258*   Lipid Profile: No results for input(s): CHOL, HDL, LDLCALC, TRIG, CHOLHDL, LDLDIRECT in the last 72 hours. Thyroid Function Tests: No results for input(s): TSH, T4TOTAL, FREET4, T3FREE, THYROIDAB in the last 72 hours. Anemia Panel: No results for input(s): VITAMINB12, FOLATE, FERRITIN, TIBC, IRON, RETICCTPCT in the last 72 hours. Sepsis Labs:  Recent Labs Lab 09/04/15 1105 09/04/15 1418 09/05/15 0059  LATICACIDVEN 1.9 2.9* 1.1    Recent Results (from the past 240 hour(s))  Blood Culture (routine x 2)     Status: None (Preliminary result)   Collection Time: 09/04/15 11:00 AM  Result Value Ref Range Status   Specimen Description BLOOD RIGHT ARM  Final   Special Requests BOTTLES DRAWN AEROBIC AND ANAEROBIC 5CC  Final   Culture NO GROWTH < 24 HOURS  Final   Report Status PENDING  Incomplete  Blood Culture (routine x 2)     Status:  None (Preliminary result)   Collection Time: 09/04/15 11:05 AM  Result Value Ref Range Status   Specimen Description BLOOD LEFT ARM  Final   Special Requests BOTTLES DRAWN AEROBIC AND ANAEROBIC 5CC  Final   Culture NO GROWTH < 24 HOURS  Final   Report Status PENDING  Incomplete  Urine culture     Status: None   Collection Time: 09/04/15 11:25 AM  Result Value Ref Range Status   Specimen Description URINE, RANDOM  Final   Special Requests Normal  Final   Culture NO GROWTH Performed at [facility]   Final  Report Status 09/05/2015 FINAL  Final  MRSA PCR Screening     Status: None   Collection Time: 09/04/15  2:19 PM  Result Value Ref Range Status   MRSA by PCR NEGATIVE NEGATIVE Final    Comment:        The GeneXpert MRSA Assay (FDA approved for NASAL specimens only), is one component of a comprehensive MRSA colonization surveillance program. It is not intended to diagnose MRSA infection nor to guide or monitor treatment for MRSA infections.   C difficile quick scan w PCR reflex     Status: None   Collection Time: 09/05/15 10:11 AM  Result Value Ref Range Status   C Diff antigen NEGATIVE NEGATIVE Final   C Diff toxin NEGATIVE NEGATIVE Final   C Diff interpretation No C. difficile detected.  Final  Gastrointestinal Panel by PCR , Stool     Status: None   Collection Time: 09/05/15 10:11 AM  Result Value Ref Range Status   Campylobacter species NOT DETECTED NOT DETECTED Final   Plesimonas shigelloides NOT DETECTED NOT DETECTED Final   Salmonella species NOT DETECTED NOT DETECTED Final   Yersinia enterocolitica NOT DETECTED NOT DETECTED Final   Vibrio species NOT DETECTED NOT DETECTED Final   Vibrio cholerae NOT DETECTED NOT DETECTED Final   Enteroaggregative E coli (EAEC) NOT DETECTED NOT DETECTED Final   Enteropathogenic E coli (EPEC) NOT DETECTED NOT DETECTED Final   Enterotoxigenic E coli (ETEC) NOT DETECTED NOT DETECTED Final   Shiga like toxin producing E coli  (STEC) NOT DETECTED NOT DETECTED Final   E. coli O157 NOT DETECTED NOT DETECTED Final   Shigella/Enteroinvasive E coli (EIEC) NOT DETECTED NOT DETECTED Final   Cryptosporidium NOT DETECTED NOT DETECTED Final   Cyclospora cayetanensis NOT DETECTED NOT DETECTED Final   Entamoeba histolytica NOT DETECTED NOT DETECTED Final   Giardia lamblia NOT DETECTED NOT DETECTED Final   Adenovirus F40/41 NOT DETECTED NOT DETECTED Final   Astrovirus NOT DETECTED NOT DETECTED Final   Norovirus GI/GII NOT DETECTED NOT DETECTED Final   Rotavirus A NOT DETECTED NOT DETECTED Final   Sapovirus (I, II, IV, and V) NOT DETECTED NOT DETECTED Final         Radiology Studies: Ct Abdomen Pelvis Wo Contrast  Result Date: 09/05/2015 CLINICAL DATA:  Left-sided hydronephrosis EXAM: CT ABDOMEN AND PELVIS WITHOUT CONTRAST TECHNIQUE: Multidetector CT imaging of the abdomen and pelvis was performed following the standard protocol without IV contrast. COMPARISON:  Renal ultrasound - 09/04/2015 FINDINGS: The lack of intravenous contrast limits the ability to evaluate solid abdominal organs. Examination is further degraded secondary to patient respiratory artifact. Lower chest: Limited visualization of the lower thorax demonstrates dependent subpleural ground-glass atelectasis. Airspace opacities are seen within the right lower lobe with associated air bronchograms. Additionally, there are ill-defined nodules seen within the right lower lobe which appear to demonstrate a tree-in-bud pattern. No pleural effusion. Normal heart size. No pericardial effusion. Decreased attenuation of the intra cardiac blood pool suggestive of anemia. Hepatobiliary: Normal hepatic contour. The gallbladder appears filled with radiopaque material suggestive of sludge. No definite ascites. Pancreas: Normal noncontrast appearance of the pancreas Spleen: Normal noncontrast appearance of the spleen. Incidental common of a small splenule. Adrenals/Urinary Tract:  Note is made of a punctate (approximately 0.5 x 0.6 cm) stone within the superior aspects of the left ureter (axial image 55, series 2; coronal image 64, series 602) which results in mild upstream ureterectasis and asymmetric left-sided pelvicaliectasis. No additional evidence of left-sided  nephrolithiasis. Solitary punctate (approximately 2 mm) nonobstructing stone within the superior pole of the right kidney (image 40, series 2). No evidence of right-sided urinary obstruction. No renal stones are seen along expected course of the right ureter. The urinary bladder is decompressed with a Foley catheter. Several prominent phleboliths are seen within the left hemipelvis. Stomach/Bowel: A rectal catheter is in place. There is diffuse thickening of the rectal wall, potentially reactive. Moderate colonic stool burden without evidence of enteric obstruction. Normal noncontrast appearance of the terminal ileum. The appendix is not visualized, however there is no asymmetric right-sided very cecal inflammatory change. No pneumoperitoneum, pneumatosis or portal venous gas. Vascular/Lymphatic: Minimal amount of atherosclerotic plaque within a normal caliber abdominal aorta. A central venous catheter tip terminates within the peripheral aspects of the left common iliac vein. No definitive bulky retroperitoneal, mesenteric, pelvic or inguinal lymphadenopathy on this noncontrast examination. Reproductive: No evidence approximately on this noncontrast examination. No free fluid in the pelvic cul-de-sac. Other: Mild diffuse body wall anasarca. Musculoskeletal: No acute or aggressive osseous abnormalities. Stigmata of DISH within the thoracic spine. IMPRESSION: 1. Punctate (approximately 0.6 cm) stone within the superior aspect of the left ureter results in very mild upstream left-sided ureterectasis and pelvicaliectasis. 2. Additional solitary punctate (approximately 2 mm) nonobstructing right-sided renal stone. No evidence of  right-sided urinary obstruction. 3. Bibasilar atelectasis with potential developing airspace opacity within the right lower lobe. Clinical correlation is advised. 4. Diffuse thickening of the rectal wall, potentially reactive secondary to placement of a rectal catheter though an enteritis could have a similar appearance. Clinical correlation is advised. No evidence of enteric obstruction. Electronically Signed   By: Simonne Come M.D.   On: 09/05/2015 21:21  Dg Chest 1 View  Result Date: 09/05/2015 CLINICAL DATA:  Unsuccessful central line placement. EXAM: CHEST 1 VIEW COMPARISON:  Radiograph of same day. FINDINGS: The heart size and mediastinal contours are within normal limits. No pleural effusion is noted. Right lung is clear. Possible pleural line is seen in left lung apex which may represent small apical pneumothorax. The visualized skeletal structures are unremarkable. IMPRESSION: Possible small left apical pneumothorax. Follow-up radiographs are recommended. Critical Value/emergent results were called by telephone at the time of interpretation on 09/05/2015 at 1:34 pm to Bennett Scrape, nurse practitioner, who verbally acknowledged these results and will immediately contact Dr. Nicholos Johns. Electronically Signed   By: Lupita Raider, M.D.   On: 09/05/2015 13:36  US Renal  Result Date: 09/04/2015 CLINICAL DATA:  Acute renal failure EXAM: RENAL / URINARY TRACT ULTRASOUND COMPLETE COMPARISON:  None. FINDINGS: Right Kidney: Length: 12.7 cm.  Slight increased cortical echogenicity Left Kidney: Length: 12.9 cm.  Mild hydronephrosis Bladder: The bladder is decompressed with a Foley catheter IMPRESSION: 1. Mild hydronephrosis on the left, age indeterminate. If there is concern for acute obstruction, CT scan may better evaluate. Electronically Signed   By: Gerome Sam III M.D   On: 09/04/2015 13:50  Dg Chest Port 1 View  Result Date: 09/06/2015 CLINICAL DATA:  Pneumothorax. EXAM: PORTABLE CHEST 1 VIEW  COMPARISON:  09/05/2015 . FINDINGS: Mediastinum hilar structures normal. Mild right base subsegmental atelectasis and or infiltrate. Small right pleural effusion. Heart size normal. IMPRESSION: Mild right base subsegmental atelectasis and or infiltrate. Right base pneumonia cannot be excluded. Similar finding noted on prior exam. Chest is otherwise stable. No pneumothorax. Electronically Signed   By: Maisie Fus  Register   On: 09/06/2015 07:15  Dg Chest Port 1 View  Result Date: 09/05/2015 CLINICAL  DATA:  Chart states that there have been several attempts to place a "left" side IJ PICC line and there was concern for a possible pneumothorax. Pt has a medium sized bandage on right side of neck, suggesting that IJ PICC was attempted on right side. Ordering MD looked at image outside of patient's room directly after I took it. Hx of diabetes. Pt was not able to verbally respond. EXAM: PORTABLE CHEST 1 VIEW COMPARISON:  09/05/2015 at 12:52 a.m. FINDINGS: There is now no evidence of a left pneumothorax. The previously seen linear opacity near the left apex was likely an artifact. Mild opacity at the right lung base is consistent with atelectasis. Consider pneumonia if there are consistent clinical symptoms. Lungs otherwise clear. No pleural effusion. Heart, mediastinum and hila are unremarkable. IMPRESSION: 1. No evidence of a pneumothorax on the current study. 2. Right lung base opacity most likely atelectasis. Pneumonia is possible. Electronically Signed   By: Amie Portland M.D.   On: 09/05/2015 17:38  Portable Chest 1 View  Result Date: 09/05/2015 CLINICAL DATA:  Pneumonia EXAM: PORTABLE CHEST 1 VIEW COMPARISON:  September 04, 2015 FINDINGS: There is patchy airspace consolidation in the right base. There is new mild atelectatic change in the left base, possibly represent a second focus of early pneumonia. Lungs elsewhere clear. Heart is upper normal in size with pulmonary vascularity within normal limits. No adenopathy.  No bone lesions. IMPRESSION: Patchy airspace opacity in the right apex consistent with a degree of pneumonia. Atelectasis is in the left lower lobe, new ; question a second focus of pneumonia developing in the left base. Lungs elsewhere clear. Stable cardiac silhouette. Electronically Signed   By: Bretta Bang III M.D.   On: 09/05/2015 07:15  Dg Chest Port 1 View  Result Date: 09/04/2015 CLINICAL DATA:  Fever, probable sepsis, diabetic. Pt unable to give hx at this time EXAM: PORTABLE CHEST 1 VIEW COMPARISON:  07/10/2015 FINDINGS: Midline trachea. Normal heart size. Numerous leads and wires project over the chest. No pleural effusion or pneumothorax. Mild medial right lung base airspace disease is suspected. Clear left lung. IMPRESSION: Subtle medial right lung airspace disease. Although this could represent atelectasis, given the clinical history, suspicious for early infection. If possible, PA and lateral radiographs should be considered. Electronically Signed   By: Jeronimo Greaves M.D.   On: 09/04/2015 11:30       Scheduled Meds: . antiseptic oral rinse  7 mL Mouth Rinse q12n4p  . chlorhexidine  15 mL Mouth Rinse BID  . heparin  5,000 Units Subcutaneous Q8H  . insulin aspart  0-5 Units Subcutaneous QHS  . insulin aspart  0-9 Units Subcutaneous TID WC  . lactulose  20 g Oral BID  . lamoTRIgine  100 mg Oral BID  . levETIRAcetam (KEPPRA) IVPB  500 mg Intravenous Q12H  . phenytoin  300 mg Oral QHS  . piperacillin-tazobactam (ZOSYN)  IV  2.25 g Intravenous Q8H  . sertraline  50 mg Oral Daily  . sodium chloride flush  3 mL Intravenous Q12H  . tamsulosin  0.4 mg Oral Daily  . vancomycin  1,000 mg Intravenous Q48H   Continuous Infusions: . dextrose       LOS: 1 day    Time spent: > 35 minutes    Penny Pia, MD [facility] Pager [phone]  If 7PM-7AM, please contact night-coverage [email or web address] Password [email or web address] 09/06/2015, 7:49 AM

## 2015-09-07 LAB — BASIC METABOLIC PANEL
Anion gap: 12 (ref 5–15)
BUN: 119 mg/dL — ABNORMAL HIGH (ref 6–20)
CO2: 14 mmol/L — ABNORMAL LOW (ref 22–32)
Calcium: 8.6 mg/dL — ABNORMAL LOW (ref 8.9–10.3)
Chloride: 121 mmol/L — ABNORMAL HIGH (ref 101–111)
Creatinine, Ser: 6.47 mg/dL — ABNORMAL HIGH (ref 0.61–1.24)
GFR calc Af Amer: 10 mL/min — ABNORMAL LOW (ref >60)
GFR calc non Af Amer: 9 mL/min — ABNORMAL LOW (ref >60)
Glucose, Bld: 263 mg/dL — ABNORMAL HIGH (ref 65–99)
Potassium: 4.1 mmol/L (ref 3.5–5.1)
Sodium: 147 mmol/L — ABNORMAL HIGH (ref 135–145)

## 2015-09-07 LAB — GLUCOSE, CAPILLARY
GLUCOSE-CAPILLARY: 244 mg/dL — AB (ref 65–99)
GLUCOSE-CAPILLARY: 239 mg/dL — AB (ref 65–99)
Glucose-Capillary: 260 mg/dL — ABNORMAL HIGH (ref 65–99)
Glucose-Capillary: 211 mg/dL — ABNORMAL HIGH (ref 65–99)
Glucose-Capillary: 225 mg/dL — ABNORMAL HIGH (ref 65–99)

## 2015-09-07 LAB — CBC
HCT: 34.2 % — ABNORMAL LOW (ref 39.0–52.0)
Hemoglobin: 11.2 g/dL — ABNORMAL LOW (ref 13.0–17.0)
MCH: 24.9 pg — ABNORMAL LOW (ref 26.0–34.0)
MCHC: 32.7 g/dL (ref 30.0–36.0)
MCV: 76.2 fL — ABNORMAL LOW (ref 78.0–100.0)
Platelets: 72 10*3/uL — ABNORMAL LOW (ref 150–400)
RBC: 4.49 MIL/uL (ref 4.22–5.81)
RDW: 16.7 % — ABNORMAL HIGH (ref 11.5–15.5)
WBC: 11.9 10*3/uL — ABNORMAL HIGH (ref 4.0–10.5)

## 2015-09-07 LAB — GLOMERULAR BASEMENT MEMBRANE ANTIBODIES: GBM Ab: 8 units (ref 0–20)

## 2015-09-07 MED ORDER — SODIUM CHLORIDE 0.9 % IV SOLN
1.5000 g | Freq: Two times a day (BID) | INTRAVENOUS | Status: DC
  Administered 2015-09-07 – 2015-09-08 (×3): 1.5 g via INTRAVENOUS
  Filled 2015-09-07 (×4): qty 1.5

## 2015-09-07 MED ORDER — SODIUM CHLORIDE 0.9 % IV SOLN
300.0000 mg | Freq: Every day | INTRAVENOUS | Status: DC
  Administered 2015-09-07 – 2015-09-11 (×5): 300 mg via INTRAVENOUS
  Filled 2015-09-07 (×4): qty 6
  Filled 2015-09-07: qty 5

## 2015-09-07 NOTE — Progress Notes (Signed)
[facility] Progress Note   Subjective: all UOP after L PCN placed is now from the PCN, foley cath output has dec'd to 0.    Vitals:   09/07/15 0400 09/07/15 0435 09/07/15 0700 09/07/15 0800  BP:  (!) 141/93  119/75  Pulse: 76 (!) 104 90 85  Resp: 16 16 17 15   Temp: 98.8 F (37.1 C)   98.3 F (36.8 C)  TempSrc: Axillary   Axillary  SpO2: 97% 98% 96% 91%  Weight:      Height:        Inpatient medications: . ampicillin-sulbactam (UNASYN) IV  1.5 g Intravenous BID  . antiseptic oral rinse  7 mL Mouth Rinse q12n4p  . chlorhexidine  15 mL Mouth Rinse BID  . insulin aspart  0-5 Units Subcutaneous QHS  . insulin aspart  0-9 Units Subcutaneous TID WC  . insulin glargine  5 Units Subcutaneous QHS  . lactulose  20 g Oral BID  . lamoTRIgine  100 mg Oral BID  . levETIRAcetam (KEPPRA) IVPB  500 mg Intravenous Q12H  . phenytoin (DILANTIN) IV  300 mg Intravenous QHS  . sertraline  50 mg Oral Daily  . sodium chloride flush  3 mL Intravenous Q12H  . tamsulosin  0.4 mg Oral Daily   . dextrose 5 % and 0.45% NaCl 75 mL (09/06/15 2216)   heparin, LORazepam  Exam: Eyes open , no verbal response, no eye contact No jvd Chest CTA bilat RRR no mrg Abd osft ntnd no ascites L flank perc neph draining bloody urine GU w foley in place No LE edema Neuro doesn't speak or follow commands  UA showing 100 prot, tntc WBC, 6-30 rbc, rare bact CT scan 7/24 showed L ureteral stone w associated upstream pelvicaliectasis/ ureterectasis  7/23 renal US confirmed mild L hydro.     Assessment: 1.  Renal failure - presumably acute, no old numbers.  Creatinine improving, making urine. Not a dialysis candidate.   2.  Vol depletion - resolving 3.  Hypotension - resolving w fluids 4.  Fever/ ^WBC - improving, on abx, blood and urine cx's neg 5.  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 Will sign off, please call as  needed.    Vinson Moselle MD [facility] pager [phone]    cell [phone] 09/07/2015, 9:36 AM    Recent Labs Lab 09/05/15 1006 09/05/15 1317 09/05/15 1741 09/06/15 0337 09/07/15 0607  NA 143 144 144 146* 147*  K 4.1 4.0 4.1 4.3 4.1  CL 115* 115* 115* 119* 121*  CO2 13* 11* 14* 13* 14*  GLUCOSE 228* 220* 227* 291* 263*  BUN 126* 129* 125* 127* 119*  CREATININE 9.57* 8.94* 8.59* 8.00* 6.47*  CALCIUM 8.9 8.4* 8.6* 8.6* 8.6*  PHOS 5.6* 5.4* 6.0*  --   --     Recent Labs Lab 09/04/15 1102 09/05/15 0059 09/05/15 1006 09/05/15 1317 09/06/15 0337  AST 30 24  --   --  11*  ALT 20 17  --   --  15*  ALKPHOS 175* 120  --   --  83  BILITOT 3.1* 3.5*  --   --  1.8*  PROT 8.0 6.2*  --   --  6.0*  ALBUMIN 2.8* 2.2* 2.0* 2.0* 2.3*    Recent Labs Lab 09/04/15 1102  09/05/15 1741 09/06/15 0337 09/07/15 0607  WBC 17.3*  < > 13.5* 14.5* 11.9*  NEUTROABS 15.7*  --   --   --   --   HGB 12.5*  < > 10.0* 10.9* 11.2*  HCT 38.5*  < > 32.0* 34.3* 34.2*  MCV 78.1*  < > 79.8 79.4 76.2*  PLT 147*  < > 73* 84* 72*  < > = values in this interval not displayed. Iron/TIBC/Ferritin/ %Sat No results found for: IRON, TIBC, FERRITIN, IRONPCTSAT

## 2015-09-07 NOTE — Progress Notes (Signed)
PROGRESS NOTE    Cory Salazar  UJW:[id number] DOB: 09-25-1960 DOA: 09/05/2015 PCP: No primary care provider on file.    Brief Narrative:  55 y.o. 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 In ER, pt was tachycardic and febrile. WBC=17k. CXR shows right-sided pneumonia. Pt was transitioned to [place] [facility] for further evaluation and recommendations given limitations of care at [place]. Urology is aware and critical care consulted. Patient will be admitted to step down for further care. Unable to obtain history from patient.   IR consulted pt s/p percutaneous nephrostomy tube placement.   Assessment & Plan:   Sepsis (HCC) - Continue augmentin if patient unable to tolerate oral may transition back to Zosyn - consulted critical care team as well - sepsis physiology resolving.  Left hydronephrosis - Discussed with Urology who is recommending CT scan of abdomen and pelvis non contrast.  -  IR consulted and pt is s/p perc nephrostomy tube placement - S creatinine trending down after tube placement    HCAP (healthcare-associated pneumonia) - improving on antibiotic.    ARF (acute renal failure) (HCC) -  Mild hydronephrosis left side.  - CT scan to better evaluate if there is acute obstruction which is most likely - Consulted IR for consideration of percutaneous nephrostomy tube placement.    Seizure disorder (HCC)  - continue home medication regimen  Of note x ray reported apical pneumothorax on the left side. Critical Care team consulted. They are uncertain if it truly represents a pneumothorax and mention that if it is a true pneumothorax that it could be managed conservatively.  DVT prophylaxis: heparin Code Status: Full Family Communication: none at bedside Disposition Plan: transiton to floor today. Pending recs from specialist.   Consultants:   Urology  IR  Critical  care team.    Antimicrobials: augmentin   Subjective: Pt gazes in my general direction but does not verbally interact with examiner.   Objective: Vitals:   09/07/15 0400 09/07/15 0435 09/07/15 0700 09/07/15 0800  BP:  (!) 141/93  119/75  Pulse: 76 (!) 104 90 85  Resp: 16 16 17 15   Temp: 98.8 F (37.1 C)     TempSrc: Axillary     SpO2: 97% 98% 96% 91%  Weight:      Height:        Intake/Output Summary (Last 24 hours) at 09/07/15 0827 Last data filed at 09/07/15 0800  Gross per 24 hour  Intake             2220 ml  Output             3015 ml  Net             -795 ml   Filed Weights   09/05/15 1452 09/06/15 1030  Weight: 65 kg (143 lb 4.8 oz) 65 kg (143 lb 4.8 oz)    Examination:  General exam: Appears calm and comfortable  Respiratory system: Clear to auscultation. Respiratory effort normal. Cardiovascular system: S1 & S2 heard, RRR. No JVD, murmurs, rubs, gallops or clicks.  Gastrointestinal system: Abdomen is nondistended, soft and nontender. No organomegaly or masses felt. Normal bowel sounds heard. Central nervous system: limited cooperation, no facial asymmetry. Extremities: Symmetric 5 x 5 power. Skin: No rashes, lesions or ulcers on limited exam. Perc nephrostomy tube placed at left side Psychiatry: unable to assess due to limited cooperation    Data Reviewed: I  have personally reviewed following labs and imaging studies  CBC:  Recent Labs Lab 09/04/15 1102 09/05/15 0059 09/05/15 1741 09/06/15 0337 09/07/15 0607  WBC 17.3* 15.1* 13.5* 14.5* 11.9*  NEUTROABS 15.7*  --   --   --   --   HGB 12.5* 10.7* 10.0* 10.9* 11.2*  HCT 38.5* 32.6* 32.0* 34.3* 34.2*  MCV 78.1* 78.1* 79.8 79.4 76.2*  PLT 147* 82* 73* 84* 72*   Basic Metabolic Panel:  Recent Labs Lab 09/05/15 1006 09/05/15 1317 09/05/15 1741 09/06/15 0337 09/07/15 0607  NA 143 144 144 146* 147*  K 4.1 4.0 4.1 4.3 4.1  CL 115* 115* 115* 119* 121*  CO2 13* 11* 14* 13* 14*  GLUCOSE 228*  220* 227* 291* 263*  BUN 126* 129* 125* 127* 119*  CREATININE 9.57* 8.94* 8.59* 8.00* 6.47*  CALCIUM 8.9 8.4* 8.6* 8.6* 8.6*  MG  --   --  3.0*  --   --   PHOS 5.6* 5.4* 6.0*  --   --    GFR: Estimated Creatinine Clearance: 12.1 mL/min (by C-G formula based on SCr of 6.47 mg/dL). Liver Function Tests:  Recent Labs Lab 09/04/15 1102 09/05/15 0059 09/05/15 1006 09/05/15 1317 09/06/15 0337  AST 30 24  --   --  11*  ALT 20 17  --   --  15*  ALKPHOS 175* 120  --   --  83  BILITOT 3.1* 3.5*  --   --  1.8*  PROT 8.0 6.2*  --   --  6.0*  ALBUMIN 2.8* 2.2* 2.0* 2.0* 2.3*   No results for input(s): LIPASE, AMYLASE in the last 168 hours. No results for input(s): AMMONIA in the last 168 hours. Coagulation Profile:  Recent Labs Lab 09/06/15 0337  INR 1.48   Cardiac Enzymes: No results for input(s): CKTOTAL, CKMB, CKMBINDEX, TROPONINI in the last 168 hours. BNP (last 3 results) No results for input(s): PROBNP in the last 8760 hours. HbA1C: No results for input(s): HGBA1C in the last 72 hours. CBG:  Recent Labs Lab 09/06/15 0736 09/06/15 1145 09/06/15 1754 09/06/15 2154 09/07/15 0734  GLUCAP 245* 218* 244* 207* 260*   Lipid Profile: No results for input(s): CHOL, HDL, LDLCALC, TRIG, CHOLHDL, LDLDIRECT in the last 72 hours. Thyroid Function Tests: No results for input(s): TSH, T4TOTAL, FREET4, T3FREE, THYROIDAB in the last 72 hours. Anemia Panel: No results for input(s): VITAMINB12, FOLATE, FERRITIN, TIBC, IRON, RETICCTPCT in the last 72 hours. Sepsis Labs:  Recent Labs Lab 09/04/15 1105 09/04/15 1418 09/05/15 0059  LATICACIDVEN 1.9 2.9* 1.1    Recent Results (from the past 240 hour(s))  Blood Culture (routine x 2)     Status: None (Preliminary result)   Collection Time: 09/04/15 11:00 AM  Result Value Ref Range Status   Specimen Description BLOOD RIGHT ARM  Final   Special Requests BOTTLES DRAWN AEROBIC AND ANAEROBIC 5CC  Final   Culture NO GROWTH 3 DAYS   Final   Report Status PENDING  Incomplete  Blood Culture (routine x 2)     Status: None (Preliminary result)   Collection Time: 09/04/15 11:05 AM  Result Value Ref Range Status   Specimen Description BLOOD LEFT ARM  Final   Special Requests BOTTLES DRAWN AEROBIC AND ANAEROBIC 5CC  Final   Culture NO GROWTH 3 DAYS  Final   Report Status PENDING  Incomplete  Urine culture     Status: None   Collection Time: 09/04/15 11:25 AM  Result Value Ref Range  Status   Specimen Description URINE, RANDOM  Final   Special Requests Normal  Final   Culture NO GROWTH Performed at [facility]   Final   Report Status 09/05/2015 FINAL  Final  MRSA PCR Screening     Status: None   Collection Time: 09/04/15  2:19 PM  Result Value Ref Range Status   MRSA by PCR NEGATIVE NEGATIVE Final    Comment:        The GeneXpert MRSA Assay (FDA approved for NASAL specimens only), is one component of a comprehensive MRSA colonization surveillance program. It is not intended to diagnose MRSA infection nor to guide or monitor treatment for MRSA infections.   C difficile quick scan w PCR reflex     Status: None   Collection Time: 09/05/15 10:11 AM  Result Value Ref Range Status   C Diff antigen NEGATIVE NEGATIVE Final   C Diff toxin NEGATIVE NEGATIVE Final   C Diff interpretation No C. difficile detected.  Final  Gastrointestinal Panel by PCR , Stool     Status: None   Collection Time: 09/05/15 10:11 AM  Result Value Ref Range Status   Campylobacter species NOT DETECTED NOT DETECTED Final   Plesimonas shigelloides NOT DETECTED NOT DETECTED Final   Salmonella species NOT DETECTED NOT DETECTED Final   Yersinia enterocolitica NOT DETECTED NOT DETECTED Final   Vibrio species NOT DETECTED NOT DETECTED Final   Vibrio cholerae NOT DETECTED NOT DETECTED Final   Enteroaggregative E coli (EAEC) NOT DETECTED NOT DETECTED Final   Enteropathogenic E coli (EPEC) NOT DETECTED NOT DETECTED Final   Enterotoxigenic E  coli (ETEC) NOT DETECTED NOT DETECTED Final   Shiga like toxin producing E coli (STEC) NOT DETECTED NOT DETECTED Final   E. coli O157 NOT DETECTED NOT DETECTED Final   Shigella/Enteroinvasive E coli (EIEC) NOT DETECTED NOT DETECTED Final   Cryptosporidium NOT DETECTED NOT DETECTED Final   Cyclospora cayetanensis NOT DETECTED NOT DETECTED Final   Entamoeba histolytica NOT DETECTED NOT DETECTED Final   Giardia lamblia NOT DETECTED NOT DETECTED Final   Adenovirus F40/41 NOT DETECTED NOT DETECTED Final   Astrovirus NOT DETECTED NOT DETECTED Final   Norovirus GI/GII NOT DETECTED NOT DETECTED Final   Rotavirus A NOT DETECTED NOT DETECTED Final   Sapovirus (I, II, IV, and V) NOT DETECTED NOT DETECTED Final         Radiology Studies: Ct Abdomen Pelvis Wo Contrast  Result Date: 09/05/2015 CLINICAL DATA:  Left-sided hydronephrosis EXAM: CT ABDOMEN AND PELVIS WITHOUT CONTRAST TECHNIQUE: Multidetector CT imaging of the abdomen and pelvis was performed following the standard protocol without IV contrast. COMPARISON:  Renal ultrasound - 09/04/2015 FINDINGS: The lack of intravenous contrast limits the ability to evaluate solid abdominal organs. Examination is further degraded secondary to patient respiratory artifact. Lower chest: Limited visualization of the lower thorax demonstrates dependent subpleural ground-glass atelectasis. Airspace opacities are seen within the right lower lobe with associated air bronchograms. Additionally, there are ill-defined nodules seen within the right lower lobe which appear to demonstrate a tree-in-bud pattern. No pleural effusion. Normal heart size. No pericardial effusion. Decreased attenuation of the intra cardiac blood pool suggestive of anemia. Hepatobiliary: Normal hepatic contour. The gallbladder appears filled with radiopaque material suggestive of sludge. No definite ascites. Pancreas: Normal noncontrast appearance of the pancreas Spleen: Normal noncontrast  appearance of the spleen. Incidental common of a small splenule. Adrenals/Urinary Tract: Note is made of a punctate (approximately 0.5 x 0.6 cm) stone within the  superior aspects of the left ureter (axial image 55, series 2; coronal image 64, series 602) which results in mild upstream ureterectasis and asymmetric left-sided pelvicaliectasis. No additional evidence of left-sided nephrolithiasis. Solitary punctate (approximately 2 mm) nonobstructing stone within the superior pole of the right kidney (image 40, series 2). No evidence of right-sided urinary obstruction. No renal stones are seen along expected course of the right ureter. The urinary bladder is decompressed with a Foley catheter. Several prominent phleboliths are seen within the left hemipelvis. Stomach/Bowel: A rectal catheter is in place. There is diffuse thickening of the rectal wall, potentially reactive. Moderate colonic stool burden without evidence of enteric obstruction. Normal noncontrast appearance of the terminal ileum. The appendix is not visualized, however there is no asymmetric right-sided very cecal inflammatory change. No pneumoperitoneum, pneumatosis or portal venous gas. Vascular/Lymphatic: Minimal amount of atherosclerotic plaque within a normal caliber abdominal aorta. A central venous catheter tip terminates within the peripheral aspects of the left common iliac vein. No definitive bulky retroperitoneal, mesenteric, pelvic or inguinal lymphadenopathy on this noncontrast examination. Reproductive: No evidence approximately on this noncontrast examination. No free fluid in the pelvic cul-de-sac. Other: Mild diffuse body wall anasarca. Musculoskeletal: No acute or aggressive osseous abnormalities. Stigmata of DISH within the thoracic spine. IMPRESSION: 1. Punctate (approximately 0.6 cm) stone within the superior aspect of the left ureter results in very mild upstream left-sided ureterectasis and pelvicaliectasis. 2. Additional solitary  punctate (approximately 2 mm) nonobstructing right-sided renal stone. No evidence of right-sided urinary obstruction. 3. Bibasilar atelectasis with potential developing airspace opacity within the right lower lobe. Clinical correlation is advised. 4. Diffuse thickening of the rectal wall, potentially reactive secondary to placement of a rectal catheter though an enteritis could have a similar appearance. Clinical correlation is advised. No evidence of enteric obstruction. Electronically Signed   By: Simonne Come M.D.   On: 09/05/2015 21:21  Dg Chest 1 View  Result Date: 09/05/2015 CLINICAL DATA:  Unsuccessful central line placement. EXAM: CHEST 1 VIEW COMPARISON:  Radiograph of same day. FINDINGS: The heart size and mediastinal contours are within normal limits. No pleural effusion is noted. Right lung is clear. Possible pleural line is seen in left lung apex which may represent small apical pneumothorax. The visualized skeletal structures are unremarkable. IMPRESSION: Possible small left apical pneumothorax. Follow-up radiographs are recommended. Critical Value/emergent results were called by telephone at the time of interpretation on 09/05/2015 at 1:34 pm to Bennett Scrape, nurse practitioner, who verbally acknowledged these results and will immediately contact Dr. Nicholos Johns. Electronically Signed   By: Lupita Raider, M.D.   On: 09/05/2015 13:36  Dg Chest Port 1 View  Result Date: 09/06/2015 CLINICAL DATA:  Pneumothorax. EXAM: PORTABLE CHEST 1 VIEW COMPARISON:  09/05/2015 . FINDINGS: Mediastinum hilar structures normal. Mild right base subsegmental atelectasis and or infiltrate. Small right pleural effusion. Heart size normal. IMPRESSION: Mild right base subsegmental atelectasis and or infiltrate. Right base pneumonia cannot be excluded. Similar finding noted on prior exam. Chest is otherwise stable. No pneumothorax. Electronically Signed   By: Maisie Fus  Register   On: 09/06/2015 07:15  Dg Chest Port 1  View  Result Date: 09/05/2015 CLINICAL DATA:  Chart states that there have been several attempts to place a "left" side IJ PICC line and there was concern for a possible pneumothorax. Pt has a medium sized bandage on right side of neck, suggesting that IJ PICC was attempted on right side. Ordering MD looked at image outside of patient's room directly  after I took it. Hx of diabetes. Pt was not able to verbally respond. EXAM: PORTABLE CHEST 1 VIEW COMPARISON:  09/05/2015 at 12:52 a.m. FINDINGS: There is now no evidence of a left pneumothorax. The previously seen linear opacity near the left apex was likely an artifact. Mild opacity at the right lung base is consistent with atelectasis. Consider pneumonia if there are consistent clinical symptoms. Lungs otherwise clear. No pleural effusion. Heart, mediastinum and hila are unremarkable. IMPRESSION: 1. No evidence of a pneumothorax on the current study. 2. Right lung base opacity most likely atelectasis. Pneumonia is possible. Electronically Signed   By: Amie Portland M.D.   On: 09/05/2015 17:38  Ir Nephrostomy Placement Left  Result Date: 09/06/2015 INDICATION: Left hydronephrosis, urosepsis and left ureteral calculus. EXAM: IR NEPHROSTOMY PLACEMENT LEFT COMPARISON:  CT of the abdomen and pelvis on 09/05/2015 MEDICATIONS: 2 g IV Ancef. As antibiotic prophylaxis, Ancef was ordered pre-procedure and administered intravenously within one hour of incision. ANESTHESIA/SEDATION: Fentanyl 75 mcg IV; Versed 2.0 mg IV Moderate Sedation Time:  13 minutes. The patient was continuously monitored during the procedure by the interventional radiology nurse under my direct supervision. CONTRAST:  10 mL Isovue-300 - administered into the collecting system(s) FLUOROSCOPY TIME:  Fluoroscopy Time: 2 minutes and 54 seconds. COMPLICATIONS: None immediate. PROCEDURE: Informed written consent was obtained from the patient's sister after a thorough discussion of the procedural risks,  benefits and alternatives. All questions were addressed. Maximal Sterile Barrier Technique was utilized including caps, mask, sterile gowns, sterile gloves, sterile drape, hand hygiene and skin antiseptic. A timeout was performed prior to the initiation of the procedure. Ultrasound was used to localize the left kidney. Under direct ultrasound guidance, a 21 gauge needle was advanced into the lower pole collecting system. Contrast was injected after aspiration of urine. A guidewire was advanced. A transitional dilator was placed. The tract was dilated over a guidewire. A 10 French nephrostomy tube was advanced. Tube position was confirmed by fluoroscopy. The catheter was connected to a gravity drainage bag. It was secured at the skin with a Prolene retention suture and StatLock device. FINDINGS: Ultrasound shows mild left-sided hydronephrosis. After nephrostomy tube placement, there was some bloody in urine return related to bleeding in the collecting system with tube advancement. Due to small size of the renal pelvis, the nephrostomy tube could not be completely formed and was left partially extending into the proximal ureter. IMPRESSION: Left percutaneous nephrostomy tube placement with a 10 French catheter placed and advanced into the proximal ureter. This will be left to gravity bag drainage. Electronically Signed   By: Irish Lack M.D.   On: 09/06/2015 17:04       Scheduled Meds: . amoxicillin-clavulanate  1 tablet Oral Daily  . antiseptic oral rinse  7 mL Mouth Rinse q12n4p  . chlorhexidine  15 mL Mouth Rinse BID  . heparin  5,000 Units Subcutaneous Q8H  . insulin aspart  0-5 Units Subcutaneous QHS  . insulin aspart  0-9 Units Subcutaneous TID WC  . insulin glargine  5 Units Subcutaneous QHS  . lactulose  20 g Oral BID  . lamoTRIgine  100 mg Oral BID  . levETIRAcetam (KEPPRA) IVPB  500 mg Intravenous Q12H  . phenytoin  300 mg Oral QHS  . sertraline  50 mg Oral Daily  . sodium chloride  flush  3 mL Intravenous Q12H  . tamsulosin  0.4 mg Oral Daily   Continuous Infusions: . dextrose 5 % and 0.45% NaCl 75 mL (  09/06/15 2216)     LOS: 2 days    Time spent: > 35 minutes    Penny Pia, MD [facility] Pager [phone]  If 7PM-7AM, please contact night-coverage [email or web address] Password [facility] 09/07/2015, 8:27 AM

## 2015-09-07 NOTE — Evaluation (Signed)
Clinical/Bedside Swallow Evaluation Patient Details  Name: Cory Salazar MRN: [id number] Date of Birth: 21-Jan-1961  Today's Date: 09/07/2015 Time: SLP Start Time (ACUTE ONLY): 1525 SLP Stop Time (ACUTE ONLY): 1540 SLP Time Calculation (min) (ACUTE ONLY): 15 min  Past Medical History:  Past Medical History:  Diagnosis Date  . Anxiety disorder   . Diabetes mellitus without complication (HCC)   . Encephalopathy   . MDD (major depressive disorder) (HCC)   . Seizure [facility])    Past Surgical History:  Past Surgical History:  Procedure Laterality Date  . IR GENERIC HISTORICAL  09/06/2015   IR NEPHROSTOMY PLACEMENT LEFT 09/06/2015 Irish Lack, MD [facility]-INTERV RAD   HPI:  55 year old male admitted 09/05/15 due to sepsis. PMH significant for Brain cancer, encephalopathy, AKI, fever, UTI. Pt now with right PNA.   Assessment / Plan / Recommendation Clinical Impression  Limited BSE completed, as pt uncooperative. Oral care was successful only on outside of teeth, as pt refused to open his jaw. Blood was noted on sponge. RN informed, and verbalized awareness that this was occuring. Pt refused trials of ice chips. Pt making intermittent vocalizations which seemed wet, however, pt was nonverbal and did not follow commands for further examination. Recommend NPO status to continue, with consideration of non-oral feeding if his refusal to eat/drink/participate in evaluation continues.     Aspiration Risk  Risk for inadequate nutrition/hydration;Severe aspiration risk    Diet Recommendation Alternative means - temporary;NPO   Medication Administration: Via alternative means    Other  Recommendations Oral Care Recommendations: Oral care QID Other Recommendations: Have oral suction available;Remove water pitcher    Follow up Recommendations   (TBD)    Frequency and Duration min 1 x/week  2 weeks       Prognosis Prognosis for Safe Diet Advancement: Guarded Barriers to Reach Goals: Cognitive  deficits;Time post onset;Behavior      Swallow Study   General Date of Onset: 09/05/15 HPI: 55 year old male admitted 09/05/15 due to sepsis. PMH significant for Brain cancer, encephalopathy, AKI, fever, UTI. Pt now with right PNA. Type of Study: Bedside Swallow Evaluation Previous Swallow Assessment: 09/05/15 @ [facility], BSE attempted. Pt too lethargic to complete Diet Prior to this Study: NPO Temperature Spikes Noted: No Respiratory Status: Room air History of Recent Intubation: No Behavior/Cognition: Alert;Uncooperative;Doesn't follow directions Oral Cavity Assessment:  (bright red blood on suction sponge noted during oral care) Oral Care Completed by SLP: Yes Oral Cavity - Dentition: Adequate natural dentition Self-Feeding Abilities: Refused PO Patient Positioning: Upright in bed Baseline Vocal Quality: Wet Volitional Cough: Cognitively unable to elicit Volitional Swallow: Unable to elicit    Oral/Motor/Sensory Function Overall Oral Motor/Sensory Function:  (unable to assess at this time)   Ice Chips Other Comments: pt refused   Thin Liquid Thin Liquid: Not tested    Nectar Thick Nectar Thick Liquid: Not tested   Honey Thick Honey Thick Liquid: Not tested   Puree Puree: Not tested   Solid    Solid: Not tested       Leigh Aurora 09/07/2015,3:49 PM  Harlow Asa. Estefani Bateson, MSP, CCC-SLP [phone]

## 2015-09-07 NOTE — Progress Notes (Signed)
PULMONARY / CRITICAL CARE MEDICINE   Name: Cory Salazar MRN: [id number] DOB: 09-Jan-1961    ADMISSION DATE:  09/05/2015 CONSULTATION DATE:  09/05/15  REFERRING MD: Amado Coe, A  CHIEF COMPLAINT:  Acute Renal Failure  Brief: 55 y/o male admitted to [facility] on 7/24 with fever, possible pneumonia and AKI.  Baseline "encephalopathy" post treatment for brain cancer.  He was treated with antibiotics but due to ongoing renal failure PCCM was consulted for possible CVVHD. He was transferred to [facility] due to an infrastructure emergency at [facility].   SUBJECTIVE:  No acute events, drain placed yesterday, making urine  VITAL SIGNS: BP 119/75 (BP Location: Left Arm)   Pulse 85   Temp 98.3 F (36.8 C) (Axillary)   Resp 15   Ht 6\' 1"  (1.854 m)   Wt 143 lb 4.8 oz (65 kg)   SpO2 91%   BMI 18.91 kg/m   HEMODYNAMICS:    VENTILATOR SETTINGS:    INTAKE / OUTPUT: I/O last 3 completed shifts: In: 3201.3 [I.V.:2664.3; Other:10; IV Piggyback:527] Out: 3140 [Urine:3140]  PHYSICAL EXAMINATION: General:  No distress on room air HENT: NCAT OP clear PULM: CTA B, normal effort CV: RRR, no mgr GI: BS+, soft, nontender MSK: normal bulk and tone Neuro: Eyes open, tracks, doesn't follow commands  LABS:  BMET  Recent Labs Lab 09/05/15 1741 09/06/15 0337 09/07/15 0607  NA 144 146* 147*  K 4.1 4.3 4.1  CL 115* 119* 121*  CO2 14* 13* 14*  BUN 125* 127* 119*  CREATININE 8.59* 8.00* 6.47*  GLUCOSE 227* 291* 263*    Electrolytes  Recent Labs Lab 09/05/15 1006 09/05/15 1317 09/05/15 1741 09/06/15 0337 09/07/15 0607  CALCIUM 8.9 8.4* 8.6* 8.6* 8.6*  MG  --   --  3.0*  --   --   PHOS 5.6* 5.4* 6.0*  --   --     CBC  Recent Labs Lab 09/05/15 1741 09/06/15 0337 09/07/15 0607  WBC 13.5* 14.5* 11.9*  HGB 10.0* 10.9* 11.2*  HCT 32.0* 34.3* 34.2*  PLT 73* 84* 72*    Coag's  Recent Labs Lab 09/06/15 0337  INR 1.48    Sepsis Markers  Recent Labs Lab 09/04/15 1105 09/04/15 1418  09/05/15 0059  LATICACIDVEN 1.9 2.9* 1.1    ABG  Recent Labs Lab 09/04/15 1102  PHART 7.30*  PCO2ART 35  PO2ART 65*    Liver Enzymes  Recent Labs Lab 09/04/15 1102 09/05/15 0059 09/05/15 1006 09/05/15 1317 09/06/15 0337  AST 30 24  --   --  11*  ALT 20 17  --   --  15*  ALKPHOS 175* 120  --   --  83  BILITOT 3.1* 3.5*  --   --  1.8*  ALBUMIN 2.8* 2.2* 2.0* 2.0* 2.3*    Cardiac Enzymes No results for input(s): TROPONINI, PROBNP in the last 168 hours.  Glucose  Recent Labs Lab 09/06/15 0107 09/06/15 0736 09/06/15 1145 09/06/15 1754 09/06/15 2154 09/07/15 0734  GLUCAP 258* 245* 218* 244* 207* 260*    Imaging Ir Nephrostomy Placement Left  Result Date: 09/06/2015 INDICATION: Left hydronephrosis, urosepsis and left ureteral calculus. EXAM: IR NEPHROSTOMY PLACEMENT LEFT COMPARISON:  CT of the abdomen and pelvis on 09/05/2015 MEDICATIONS: 2 g IV Ancef. As antibiotic prophylaxis, Ancef was ordered pre-procedure and administered intravenously within one hour of incision. ANESTHESIA/SEDATION: Fentanyl 75 mcg IV; Versed 2.0 mg IV Moderate Sedation Time:  13 minutes. The patient was continuously monitored during the procedure by the interventional  radiology nurse under my direct supervision. CONTRAST:  10 mL Isovue-300 - administered into the collecting system(s) FLUOROSCOPY TIME:  Fluoroscopy Time: 2 minutes and 54 seconds. COMPLICATIONS: None immediate. PROCEDURE: Informed written consent was obtained from the patient's sister after a thorough discussion of the procedural risks, benefits and alternatives. All questions were addressed. Maximal Sterile Barrier Technique was utilized including caps, mask, sterile gowns, sterile gloves, sterile drape, hand hygiene and skin antiseptic. A timeout was performed prior to the initiation of the procedure. Ultrasound was used to localize the left kidney. Under direct ultrasound guidance, a 21 gauge needle was advanced into the lower pole  collecting system. Contrast was injected after aspiration of urine. A guidewire was advanced. A transitional dilator was placed. The tract was dilated over a guidewire. A 10 French nephrostomy tube was advanced. Tube position was confirmed by fluoroscopy. The catheter was connected to a gravity drainage bag. It was secured at the skin with a Prolene retention suture and StatLock device. FINDINGS: Ultrasound shows mild left-sided hydronephrosis. After nephrostomy tube placement, there was some bloody in urine return related to bleeding in the collecting system with tube advancement. Due to small size of the renal pelvis, the nephrostomy tube could not be completely formed and was left partially extending into the proximal ureter. IMPRESSION: Left percutaneous nephrostomy tube placement with a 10 French catheter placed and advanced into the proximal ureter. This will be left to gravity bag drainage. Electronically Signed   By: Irish Lack M.D.   On: 09/06/2015 17:04    STUDIES:  7/23 renal ultrasound>>. Mild hydronephrosis on the left 7/24 CT abdomen/pelvis >> small superior uretal stone in with mild pelviectasis, additional non-obstructing stone on R, rectal wall thickening, bibasilar atelectasis  CULTURES: 7/23 blood cultures>> NGTD 7/23 urine > NGTD 7/24 C. Difficile>> negative  ANTIBIOTICS: 7/23 Zosyn>>   7/23 vancomycin >> 7/24   SIGNIFICANT EVENTS: 7/23 patient admitted to the ICU with right-sided pneumonia and renal failure requiring CRRT 7/25 percutaneous nephrostomy tube placed  LINES/TUBES: 7/24 left femoral>>  DISCUSSION: 55 year old with diabetes mellitus, encephalopathy, brain tumor status post resection, major depressive disorder and seizures, presenting with right-sided pneumonia and renal failure.  He also has a UTI with hydronephrosis now s/p percutaneous nephrostomy.  AKI improving.  ASSESSMENT / PLAN:  PULMONARY A: HCAP > aspiration pneumonia? No evidence of  pneumothorax P:   Monitor respiratory status SLP evaluation for aspiration CXR prn Aspiration precautions  CARDIOVASCULAR A:  No active issues P:  Continuous telemetry Keep MAP>65  RENAL A:   Acute renal failure > improving slowly with IVF Left-sided hydronephrosis P:   Appreciate renal Would continue IVF   Overall status improved, will sign off  Heber [place], MD [place] PCCM Pager: [phone] Cell: [phone] After 3pm or if no response, call [phone]    09/07/2015, 9:31 AM

## 2015-09-07 NOTE — Progress Notes (Signed)
Inpatient Diabetes Program Recommendations  AACE/ADA: New Consensus Statement on Inpatient Glycemic Control (2015)  Target Ranges:  Prepandial:   less than 140 mg/dL      Peak postprandial:   less than 180 mg/dL (1-2 hours)      Critically ill patients:  140 - 180 mg/dL   Results for Cory Salazar, Cory Salazar (MRN [id number]) as of 09/07/2015 08:25  Ref. Range 09/06/2015 07:36 09/06/2015 11:45 09/06/2015 17:54 09/06/2015 21:54  Glucose-Capillary Latest Ref Range: 65 - 99 mg/dL 448 (H) 185 (H) 631 (H) 207 (H)   Results for Cory Salazar, Cory Salazar (MRN [id number]) as of 09/07/2015 08:25  Ref. Range 09/07/2015 07:34  Glucose-Capillary Latest Ref Range: 65 - 99 mg/dL 588 (H)    Admit with: ARF/ Sepsis/ Pneumonia  History: DM, Brain Tumor  Home DM Meds: Lantus 20 units QHS  Current Insulin Orders: Novolog Sensitive Correction Scale/ SSI (0-9 units) TID AC + HS      Lantus 5 units QHS      -Note patient received 60 mg Iv Solumedrol X 1 dose yesterday at 1pm.  -Glucose levels >200 mg/dl.  Takes Lantus at home.     MD- Fasting glucose remains elevated today (260 mg/dl)  Please consider increasing Lantus to 20 units QHS (home dose)    --Will follow patient during hospitalization--  Ambrose Finland RN, MSN, CDE Diabetes Coordinator Inpatient Glycemic Control Team Team Pager: [phone] (8a-5p)

## 2015-09-07 NOTE — Progress Notes (Signed)
Patient ID: Cory Salazar, male   DOB: 12-24-1960, 55 y.o.   MRN: [id number]    Referring Physician(s): Grapey,D Supervising Physician: Malachy Moan  Patient Status:  Inpatient  Chief Complaint:  Left hydronephrosis, urosepsis, left ureteral calculus  Subjective:  Pt without acute changes; some leaking noted around foley per nurse; PCN functioning well  Allergies: Review of patient's allergies indicates no known allergies.  Medications: Prior to Admission medications   Medication Sig Start Date End Date Taking? Authorizing Provider  acetaminophen (TYLENOL) 325 MG tablet Take 650 mg by mouth every 4 (four) hours as needed for mild pain, moderate pain or fever.   Yes Historical Provider, MD  insulin glargine (LANTUS) 100 UNIT/ML injection Inject 0.2 mLs (20 Units total) into the skin at bedtime. 09/05/15  Yes Ramonita Lab, MD  ipratropium-albuterol (DUONEB) 0.5-2.5 (3) MG/3ML SOLN Take 3 mLs by nebulization 4 (four) times daily. 09/05/15  Yes Ramonita Lab, MD  lactulose (CHRONULAC) 10 GM/15ML solution Take 30 mLs (20 g total) by mouth 2 (two) times daily. 09/05/15  Yes Ramonita Lab, MD  lamoTRIgine (LAMICTAL) 100 MG tablet Take 1 tablet (100 mg total) by mouth 2 (two) times daily. 09/05/15  Yes Ramonita Lab, MD  levETIRAcetam 500 mg in sodium chloride 0.9 % 100 mL Inject 500 mg into the vein every 12 (twelve) hours. 09/05/15  Yes Ramonita Lab, MD  LORazepam (ATIVAN) 2 MG/ML injection Inject 0.5 mLs (1 mg total) into the vein every 4 (four) hours as needed for seizure. 09/05/15  Yes Ramonita Lab, MD  mirtazapine (REMERON) 15 MG tablet Take 1 tablet (15 mg total) by mouth at bedtime. 09/05/15  Yes Ramonita Lab, MD  phenytoin (DILANTIN) 50 MG tablet Chew 6 tablets (300 mg total) by mouth at bedtime. 07/10/15 07/09/16 Yes Jeanmarie Plant, MD  piperacillin-tazobactam (ZOSYN) 3.375 GM/50ML IVPB Inject 50 mLs (3.375 g total) into the vein every 8 (eight) hours. 09/05/15  Yes Ramonita Lab, MD  potassium  chloride SA (K-DUR,KLOR-CON) 20 MEQ tablet Take 20 mEq by mouth 2 (two) times daily.   Yes Historical Provider, MD  QUEtiapine (SEROQUEL) 200 MG tablet Take 200 mg by mouth 2 (two) times daily. **Medication on hold from 09/01/15 to 09/04/15 per MD at [facility]**   Yes Historical Provider, MD  sertraline (ZOLOFT) 50 MG tablet Take 50 mg by mouth daily.   Yes Historical Provider, MD  sodium chloride 0.45 % solution Inject 100 mLs into the vein continuous. 09/05/15  Yes Ramonita Lab, MD  sodium chloride flush (NS) 0.9 % SOLN Inject 3 mLs into the vein every 12 (twelve) hours. 09/05/15  Yes Ramonita Lab, MD  tamsulosin (FLOMAX) 0.4 MG CAPS capsule Take 0.4 mg by mouth daily.   Yes Historical Provider, MD     Vital Signs: BP 109/79 (BP Location: Left Arm)   Pulse 87   Temp 98.5 F (36.9 C) (Oral)   Resp 16   Ht 6\' 1"  (1.854 m)   Wt 143 lb 4.8 oz (65 kg)   SpO2 99%   BMI 18.91 kg/m   Physical Exam left PCN intact, output 1.25 liters amber urine, dressing dry, site NT  Imaging: Ct Abdomen Pelvis Wo Contrast  Result Date: 09/05/2015 CLINICAL DATA:  Left-sided hydronephrosis EXAM: CT ABDOMEN AND PELVIS WITHOUT CONTRAST TECHNIQUE: Multidetector CT imaging of the abdomen and pelvis was performed following the standard protocol without IV contrast. COMPARISON:  Renal ultrasound - 09/04/2015 FINDINGS: The lack of intravenous contrast limits the ability to evaluate solid  abdominal organs. Examination is further degraded secondary to patient respiratory artifact. Lower chest: Limited visualization of the lower thorax demonstrates dependent subpleural ground-glass atelectasis. Airspace opacities are seen within the right lower lobe with associated air bronchograms. Additionally, there are ill-defined nodules seen within the right lower lobe which appear to demonstrate a tree-in-bud pattern. No pleural effusion. Normal heart size. No pericardial effusion. Decreased attenuation of the intra cardiac  blood pool suggestive of anemia. Hepatobiliary: Normal hepatic contour. The gallbladder appears filled with radiopaque material suggestive of sludge. No definite ascites. Pancreas: Normal noncontrast appearance of the pancreas Spleen: Normal noncontrast appearance of the spleen. Incidental common of a small splenule. Adrenals/Urinary Tract: Note is made of a punctate (approximately 0.5 x 0.6 cm) stone within the superior aspects of the left ureter (axial image 55, series 2; coronal image 64, series 602) which results in mild upstream ureterectasis and asymmetric left-sided pelvicaliectasis. No additional evidence of left-sided nephrolithiasis. Solitary punctate (approximately 2 mm) nonobstructing stone within the superior pole of the right kidney (image 40, series 2). No evidence of right-sided urinary obstruction. No renal stones are seen along expected course of the right ureter. The urinary bladder is decompressed with a Foley catheter. Several prominent phleboliths are seen within the left hemipelvis. Stomach/Bowel: A rectal catheter is in place. There is diffuse thickening of the rectal wall, potentially reactive. Moderate colonic stool burden without evidence of enteric obstruction. Normal noncontrast appearance of the terminal ileum. The appendix is not visualized, however there is no asymmetric right-sided very cecal inflammatory change. No pneumoperitoneum, pneumatosis or portal venous gas. Vascular/Lymphatic: Minimal amount of atherosclerotic plaque within a normal caliber abdominal aorta. A central venous catheter tip terminates within the peripheral aspects of the left common iliac vein. No definitive bulky retroperitoneal, mesenteric, pelvic or inguinal lymphadenopathy on this noncontrast examination. Reproductive: No evidence approximately on this noncontrast examination. No free fluid in the pelvic cul-de-sac. Other: Mild diffuse body wall anasarca. Musculoskeletal: No acute or aggressive osseous  abnormalities. Stigmata of DISH within the thoracic spine. IMPRESSION: 1. Punctate (approximately 0.6 cm) stone within the superior aspect of the left ureter results in very mild upstream left-sided ureterectasis and pelvicaliectasis. 2. Additional solitary punctate (approximately 2 mm) nonobstructing right-sided renal stone. No evidence of right-sided urinary obstruction. 3. Bibasilar atelectasis with potential developing airspace opacity within the right lower lobe. Clinical correlation is advised. 4. Diffuse thickening of the rectal wall, potentially reactive secondary to placement of a rectal catheter though an enteritis could have a similar appearance. Clinical correlation is advised. No evidence of enteric obstruction. Electronically Signed   By: Simonne Come M.D.   On: 09/05/2015 21:21  Dg Chest 1 View  Result Date: 09/05/2015 CLINICAL DATA:  Unsuccessful central line placement. EXAM: CHEST 1 VIEW COMPARISON:  Radiograph of same day. FINDINGS: The heart size and mediastinal contours are within normal limits. No pleural effusion is noted. Right lung is clear. Possible pleural line is seen in left lung apex which may represent small apical pneumothorax. The visualized skeletal structures are unremarkable. IMPRESSION: Possible small left apical pneumothorax. Follow-up radiographs are recommended. Critical Value/emergent results were called by telephone at the time of interpretation on 09/05/2015 at 1:34 pm to Bennett Scrape, nurse practitioner, who verbally acknowledged these results and will immediately contact Dr. Nicholos Johns. Electronically Signed   By: Lupita Raider, M.D.   On: 09/05/2015 13:36  US Renal  Result Date: 09/04/2015 CLINICAL DATA:  Acute renal failure EXAM: RENAL / URINARY TRACT ULTRASOUND COMPLETE COMPARISON:  None. FINDINGS: Right Kidney: Length: 12.7 cm.  Slight increased cortical echogenicity Left Kidney: Length: 12.9 cm.  Mild hydronephrosis Bladder: The bladder is decompressed  with a Foley catheter IMPRESSION: 1. Mild hydronephrosis on the left, age indeterminate. If there is concern for acute obstruction, CT scan may better evaluate. Electronically Signed   By: Gerome Sam III M.D   On: 09/04/2015 13:50  Dg Chest Port 1 View  Result Date: 09/06/2015 CLINICAL DATA:  Pneumothorax. EXAM: PORTABLE CHEST 1 VIEW COMPARISON:  09/05/2015 . FINDINGS: Mediastinum hilar structures normal. Mild right base subsegmental atelectasis and or infiltrate. Small right pleural effusion. Heart size normal. IMPRESSION: Mild right base subsegmental atelectasis and or infiltrate. Right base pneumonia cannot be excluded. Similar finding noted on prior exam. Chest is otherwise stable. No pneumothorax. Electronically Signed   By: Maisie Fus  Register   On: 09/06/2015 07:15  Dg Chest Port 1 View  Result Date: 09/05/2015 CLINICAL DATA:  Chart states that there have been several attempts to place a "left" side IJ PICC line and there was concern for a possible pneumothorax. Pt has a medium sized bandage on right side of neck, suggesting that IJ PICC was attempted on right side. Ordering MD looked at image outside of patient's room directly after I took it. Hx of diabetes. Pt was not able to verbally respond. EXAM: PORTABLE CHEST 1 VIEW COMPARISON:  09/05/2015 at 12:52 a.m. FINDINGS: There is now no evidence of a left pneumothorax. The previously seen linear opacity near the left apex was likely an artifact. Mild opacity at the right lung base is consistent with atelectasis. Consider pneumonia if there are consistent clinical symptoms. Lungs otherwise clear. No pleural effusion. Heart, mediastinum and hila are unremarkable. IMPRESSION: 1. No evidence of a pneumothorax on the current study. 2. Right lung base opacity most likely atelectasis. Pneumonia is possible. Electronically Signed   By: Amie Portland M.D.   On: 09/05/2015 17:38  Portable Chest 1 View  Result Date: 09/05/2015 CLINICAL DATA:  Pneumonia  EXAM: PORTABLE CHEST 1 VIEW COMPARISON:  September 04, 2015 FINDINGS: There is patchy airspace consolidation in the right base. There is new mild atelectatic change in the left base, possibly represent a second focus of early pneumonia. Lungs elsewhere clear. Heart is upper normal in size with pulmonary vascularity within normal limits. No adenopathy. No bone lesions. IMPRESSION: Patchy airspace opacity in the right apex consistent with a degree of pneumonia. Atelectasis is in the left lower lobe, new ; question a second focus of pneumonia developing in the left base. Lungs elsewhere clear. Stable cardiac silhouette. Electronically Signed   By: Bretta Bang III M.D.   On: 09/05/2015 07:15  Dg Chest Port 1 View  Result Date: 09/04/2015 CLINICAL DATA:  Fever, probable sepsis, diabetic. Pt unable to give hx at this time EXAM: PORTABLE CHEST 1 VIEW COMPARISON:  07/10/2015 FINDINGS: Midline trachea. Normal heart size. Numerous leads and wires project over the chest. No pleural effusion or pneumothorax. Mild medial right lung base airspace disease is suspected. Clear left lung. IMPRESSION: Subtle medial right lung airspace disease. Although this could represent atelectasis, given the clinical history, suspicious for early infection. If possible, PA and lateral radiographs should be considered. Electronically Signed   By: Jeronimo Greaves M.D.   On: 09/04/2015 11:30  Ir Nephrostomy Placement Left  Result Date: 09/06/2015 INDICATION: Left hydronephrosis, urosepsis and left ureteral calculus. EXAM: IR NEPHROSTOMY PLACEMENT LEFT COMPARISON:  CT of the abdomen and pelvis on 09/05/2015 MEDICATIONS: 2 g  IV Ancef. As antibiotic prophylaxis, Ancef was ordered pre-procedure and administered intravenously within one hour of incision. ANESTHESIA/SEDATION: Fentanyl 75 mcg IV; Versed 2.0 mg IV Moderate Sedation Time:  13 minutes. The patient was continuously monitored during the procedure by the interventional radiology nurse under  my direct supervision. CONTRAST:  10 mL Isovue-300 - administered into the collecting system(s) FLUOROSCOPY TIME:  Fluoroscopy Time: 2 minutes and 54 seconds. COMPLICATIONS: None immediate. PROCEDURE: Informed written consent was obtained from the patient's sister after a thorough discussion of the procedural risks, benefits and alternatives. All questions were addressed. Maximal Sterile Barrier Technique was utilized including caps, mask, sterile gowns, sterile gloves, sterile drape, hand hygiene and skin antiseptic. A timeout was performed prior to the initiation of the procedure. Ultrasound was used to localize the left kidney. Under direct ultrasound guidance, a 21 gauge needle was advanced into the lower pole collecting system. Contrast was injected after aspiration of urine. A guidewire was advanced. A transitional dilator was placed. The tract was dilated over a guidewire. A 10 French nephrostomy tube was advanced. Tube position was confirmed by fluoroscopy. The catheter was connected to a gravity drainage bag. It was secured at the skin with a Prolene retention suture and StatLock device. FINDINGS: Ultrasound shows mild left-sided hydronephrosis. After nephrostomy tube placement, there was some bloody in urine return related to bleeding in the collecting system with tube advancement. Due to small size of the renal pelvis, the nephrostomy tube could not be completely formed and was left partially extending into the proximal ureter. IMPRESSION: Left percutaneous nephrostomy tube placement with a 10 French catheter placed and advanced into the proximal ureter. This will be left to gravity bag drainage. Electronically Signed   By: Irish Lack M.D.   On: 09/06/2015 17:04   Labs:  CBC:  Recent Labs  09/05/15 0059 09/05/15 1741 09/06/15 0337 09/07/15 0607  WBC 15.1* 13.5* 14.5* 11.9*  HGB 10.7* 10.0* 10.9* 11.2*  HCT 32.6* 32.0* 34.3* 34.2*  PLT 82* 73* 84* 72*    COAGS:  Recent Labs   09/06/15 0337  INR 1.48    BMP:  Recent Labs  09/05/15 1317 09/05/15 1741 09/06/15 0337 09/07/15 0607  NA 144 144 146* 147*  K 4.0 4.1 4.3 4.1  CL 115* 115* 119* 121*  CO2 11* 14* 13* 14*  GLUCOSE 220* 227* 291* 263*  BUN 129* 125* 127* 119*  CALCIUM 8.4* 8.6* 8.6* 8.6*  CREATININE 8.94* 8.59* 8.00* 6.47*  GFRNONAA 6* 6* 7* 9*  GFRAA 7* 7* 8* 10*    LIVER FUNCTION TESTS:  Recent Labs  07/10/15 1231 09/04/15 1102 09/05/15 0059 09/05/15 1006 09/05/15 1317 09/06/15 0337  BILITOT 0.4 3.1* 3.5*  --   --  1.8*  AST 27 30 24   --   --  11*  ALT 23 20 17   --   --  15*  ALKPHOS 141* 175* 120  --   --  83  PROT 8.1 8.0 6.2*  --   --  6.0*  ALBUMIN 3.9 2.8* 2.2* 2.0* 2.0* 2.3*    Assessment and Plan: S/p left PCN 7/25 (stone/hydro); AF; WBC 11.9(14.5); hgb 11.2, plts 72k, creat 6.47(8.0); cont current tx; further plans as per urology/IM/nephrology   Electronically Signed: D. Jeananne Rama 09/07/2015, 4:59 PM   I spent a total of 15 minutes at the the patient's bedside AND on the patient's hospital floor or unit, greater than 50% of which was counseling/coordinating care for left percutaneous nephrostomy

## 2015-09-07 NOTE — Progress Notes (Deleted)
[facility] Progress Note   Subjective: all UOP after L PCN placed is now from the PCN, foley cath output has dec'd to 0.    Vitals:   09/07/15 0400 09/07/15 0435 09/07/15 0700 09/07/15 0800  BP:  (!) 141/93  119/75  Pulse: 76 (!) 104 90 85  Resp: 16 16 17 15   Temp: 98.8 F (37.1 C)   98.3 F (36.8 C)  TempSrc: Axillary   Axillary  SpO2: 97% 98% 96% 91%  Weight:      Height:        Inpatient medications: . ampicillin-sulbactam (UNASYN) IV  1.5 g Intravenous BID  . antiseptic oral rinse  7 mL Mouth Rinse q12n4p  . chlorhexidine  15 mL Mouth Rinse BID  . insulin aspart  0-5 Units Subcutaneous QHS  . insulin aspart  0-9 Units Subcutaneous TID WC  . insulin glargine  5 Units Subcutaneous QHS  . lactulose  20 g Oral BID  . lamoTRIgine  100 mg Oral BID  . levETIRAcetam (KEPPRA) IVPB  500 mg Intravenous Q12H  . phenytoin (DILANTIN) IV  300 mg Intravenous QHS  . sertraline  50 mg Oral Daily  . sodium chloride flush  3 mL Intravenous Q12H  . tamsulosin  0.4 mg Oral Daily   . dextrose 5 % and 0.45% NaCl 75 mL (09/06/15 2216)   heparin, LORazepam  Exam: Eyes open , no verbal response, no eye contact No jvd Chest CTA bilat RRR no mrg Abd osft ntnd no ascites L flank perc neph draining bloody urine GU w foley in place No LE edema Neuro doesn't speak or follow commands  UA showing 100 prot, tntc WBC, 6-30 rbc, rare bact CT scan 7/24 showed L ureteral stone w associated upstream pelvicaliectasis/ ureterectasis  7/23 renal US confirmed mild L hydro.     Assessment: 1.  Renal failure - presumably acute, no old numbers.  Creatinine improving, making urine. Not a dialysis candidate.   2.  Vol depletion - resolving 3.  Hypotension - resolving w fluids 4.  Fever/ ^WBC - improving, on abx, blood and urine cx's neg 5.  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 Will sign off, please call as  needed.    Vinson Moselle MD [facility] pager [phone]    cell [phone] 09/07/2015, 9:05 AM    Recent Labs Lab 09/05/15 1006 09/05/15 1317 09/05/15 1741 09/06/15 0337 09/07/15 0607  NA 143 144 144 146* 147*  K 4.1 4.0 4.1 4.3 4.1  CL 115* 115* 115* 119* 121*  CO2 13* 11* 14* 13* 14*  GLUCOSE 228* 220* 227* 291* 263*  BUN 126* 129* 125* 127* 119*  CREATININE 9.57* 8.94* 8.59* 8.00* 6.47*  CALCIUM 8.9 8.4* 8.6* 8.6* 8.6*  PHOS 5.6* 5.4* 6.0*  --   --     Recent Labs Lab 09/04/15 1102 09/05/15 0059 09/05/15 1006 09/05/15 1317 09/06/15 0337  AST 30 24  --   --  11*  ALT 20 17  --   --  15*  ALKPHOS 175* 120  --   --  83  BILITOT 3.1* 3.5*  --   --  1.8*  PROT 8.0 6.2*  --   --  6.0*  ALBUMIN 2.8* 2.2* 2.0* 2.0* 2.3*    Recent Labs Lab 09/04/15 1102  09/05/15 1741 09/06/15 0337 09/07/15 0607  WBC 17.3*  < > 13.5* 14.5* 11.9*  NEUTROABS 15.7*  --   --   --   --   HGB 12.5*  < > 10.0* 10.9* 11.2*  HCT 38.5*  < > 32.0* 34.3* 34.2*  MCV 78.1*  < > 79.8 79.4 76.2*  PLT 147*  < > 73* 84* 72*  < > = values in this interval not displayed. Iron/TIBC/Ferritin/ %Sat No results found for: IRON, TIBC, FERRITIN, IRONPCTSAT

## 2015-09-08 ENCOUNTER — Inpatient Hospital Stay (HOSPITAL_COMMUNITY): Payer: Medicare Other

## 2015-09-08 LAB — BASIC METABOLIC PANEL
ANION GAP: 12 (ref 5–15)
BUN: 104 mg/dL — ABNORMAL HIGH (ref 6–20)
CALCIUM: 8.5 mg/dL — AB (ref 8.9–10.3)
CO2: 14 mmol/L — ABNORMAL LOW (ref 22–32)
Chloride: 125 mmol/L — ABNORMAL HIGH (ref 101–111)
Creatinine, Ser: 4.86 mg/dL — ABNORMAL HIGH (ref 0.61–1.24)
GFR, EST AFRICAN AMERICAN: 14 mL/min — AB (ref >60)
GFR, EST NON AFRICAN AMERICAN: 12 mL/min — AB (ref >60)
GLUCOSE: 267 mg/dL — AB (ref 65–99)
Potassium: 3.2 mmol/L — ABNORMAL LOW (ref 3.5–5.1)
SODIUM: 151 mmol/L — AB (ref 135–145)

## 2015-09-08 LAB — BLOOD GAS, ARTERIAL
ACID-BASE DEFICIT: 10 mmol/L — AB (ref 0.0–2.0)
ACID-BASE DEFICIT: 8.8 mmol/L — AB (ref 0.0–2.0)
Bicarbonate: 16.5 mEq/L — ABNORMAL LOW (ref 20.0–24.0)
Bicarbonate: 16.7 mEq/L — ABNORMAL LOW (ref 20.0–24.0)
DRAWN BY: 257701
DRAWN BY: 257701
FIO2: 1
FIO2: 1
O2 Saturation: 87.9 %
O2 Saturation: 99.5 %
PCO2 ART: 40.8 mmHg (ref 35.0–45.0)
PEEP/CPAP: 5 cmH2O
PH ART: 7.287 — AB (ref 7.350–7.450)
Patient temperature: 98.6
Patient temperature: 98.6
RATE: 18 resp/min
TCO2: 15.7 mmol/L (ref 0–100)
TCO2: 15.3 mmol/L (ref 0–100)
VT: 640 mL
pCO2 arterial: 36.1 mmHg (ref 35.0–45.0)
pH, Arterial: 7.232 — ABNORMAL LOW (ref 7.350–7.450)
pO2, Arterial: 64.9 mmHg — ABNORMAL LOW (ref 80.0–100.0)
pO2, Arterial: 347 mmHg — ABNORMAL HIGH (ref 80.0–100.0)

## 2015-09-08 LAB — CBC WITH DIFFERENTIAL/PLATELET
Basophils Absolute: 0 10*3/uL (ref 0.0–0.1)
Basophils Relative: 0 %
EOS ABS: 0 10*3/uL (ref 0.0–0.7)
EOS PCT: 0 %
HCT: 32.9 % — ABNORMAL LOW (ref 39.0–52.0)
Hemoglobin: 10.8 g/dL — ABNORMAL LOW (ref 13.0–17.0)
LYMPHS ABS: 1.7 10*3/uL (ref 0.7–4.0)
Lymphocytes Relative: 10 %
MCH: 25.1 pg — AB (ref 26.0–34.0)
MCHC: 32.8 g/dL (ref 30.0–36.0)
MCV: 76.5 fL — AB (ref 78.0–100.0)
MONO ABS: 0.8 10*3/uL (ref 0.1–1.0)
Monocytes Relative: 5 %
Neutro Abs: 14.1 10*3/uL — ABNORMAL HIGH (ref 1.7–7.7)
Neutrophils Relative %: 85 %
PLATELETS: 78 10*3/uL — AB (ref 150–400)
RBC: 4.3 MIL/uL (ref 4.22–5.81)
RDW: 16.6 % — AB (ref 11.5–15.5)
WBC: 16.6 10*3/uL — AB (ref 4.0–10.5)

## 2015-09-08 LAB — GLUCOSE, CAPILLARY
GLUCOSE-CAPILLARY: 231 mg/dL — AB (ref 65–99)
GLUCOSE-CAPILLARY: 146 mg/dL — AB (ref 65–99)
Glucose-Capillary: 255 mg/dL — ABNORMAL HIGH (ref 65–99)
Glucose-Capillary: 311 mg/dL — ABNORMAL HIGH (ref 65–99)
Glucose-Capillary: 202 mg/dL — ABNORMAL HIGH (ref 65–99)

## 2015-09-08 MED ORDER — DOCUSATE SODIUM 50 MG/5ML PO LIQD: 100.0000 mg | Freq: Two times a day (BID) | ORAL | Status: DC | PRN

## 2015-09-08 MED ORDER — MIDAZOLAM HCL 2 MG/2ML IJ SOLN
INTRAMUSCULAR | Status: AC
  Administered 2015-09-08: 13:00:00
  Filled 2015-09-08: qty 2

## 2015-09-08 MED ORDER — FENTANYL CITRATE (PF) 100 MCG/2ML IJ SOLN
100.0000 ug | INTRAMUSCULAR | Status: DC | PRN
  Administered 2015-09-08: 100 ug via INTRAVENOUS
  Filled 2015-09-08 (×3): qty 2

## 2015-09-08 MED ORDER — SCOPOLAMINE 1 MG/3DAYS TD PT72
1.0000 | MEDICATED_PATCH | TRANSDERMAL | Status: DC
  Administered 2015-09-08 – 2015-09-20 (×5): 1.5 mg via TRANSDERMAL
  Filled 2015-09-08 (×5): qty 1

## 2015-09-08 MED ORDER — ANTISEPTIC ORAL RINSE SOLUTION (CORINZ)
7.0000 mL | Freq: Four times a day (QID) | OROMUCOSAL | Status: DC
  Administered 2015-09-08 – 2015-09-20 (×44): 7 mL via OROMUCOSAL

## 2015-09-08 MED ORDER — CHLORHEXIDINE GLUCONATE 0.12% ORAL RINSE (MEDLINE KIT)
15.0000 mL | Freq: Two times a day (BID) | OROMUCOSAL | Status: DC
  Administered 2015-09-08 – 2015-09-24 (×30): 15 mL via OROMUCOSAL

## 2015-09-08 MED ORDER — MIDAZOLAM HCL 2 MG/2ML IJ SOLN
2.0000 mg | INTRAMUSCULAR | Status: DC | PRN
  Administered 2015-09-09 – 2015-09-10 (×2): 2 mg via INTRAVENOUS
  Filled 2015-09-08 (×5): qty 2

## 2015-09-08 MED ORDER — ROCURONIUM BROMIDE 50 MG/5ML IV SOLN
60.0000 mg | Freq: Once | INTRAVENOUS | Status: AC
  Administered 2015-09-08: 60 mg via INTRAVENOUS
  Filled 2015-09-08: qty 6

## 2015-09-08 MED ORDER — SODIUM CHLORIDE 0.9 % IV SOLN
1.5000 g | Freq: Two times a day (BID) | INTRAVENOUS | Status: DC
  Administered 2015-09-08 – 2015-09-11 (×6): 1.5 g via INTRAVENOUS
  Filled 2015-09-08 (×6): qty 1.5

## 2015-09-08 MED ORDER — INSULIN ASPART 100 UNIT/ML ~~LOC~~ SOLN
0.0000 [IU] | SUBCUTANEOUS | Status: DC
  Administered 2015-09-08: 5 [IU] via SUBCUTANEOUS
  Administered 2015-09-08: 3 [IU] via SUBCUTANEOUS
  Administered 2015-09-08: 2 [IU] via SUBCUTANEOUS
  Administered 2015-09-08: 5 [IU] via SUBCUTANEOUS
  Administered 2015-09-09: 8 [IU] via SUBCUTANEOUS
  Administered 2015-09-09: 5 [IU] via SUBCUTANEOUS
  Administered 2015-09-09: 8 [IU] via SUBCUTANEOUS
  Administered 2015-09-09: 5 [IU] via SUBCUTANEOUS
  Administered 2015-09-09: 3 [IU] via SUBCUTANEOUS
  Administered 2015-09-10: 15 [IU] via SUBCUTANEOUS
  Administered 2015-09-10 (×3): 11 [IU] via SUBCUTANEOUS
  Administered 2015-09-10: 15 [IU] via SUBCUTANEOUS
  Administered 2015-09-10: 11 [IU] via SUBCUTANEOUS
  Administered 2015-09-11 (×2): 15 [IU] via SUBCUTANEOUS
  Administered 2015-09-11 – 2015-09-12 (×5): 8 [IU] via SUBCUTANEOUS
  Administered 2015-09-12: 11 [IU] via SUBCUTANEOUS
  Administered 2015-09-12 (×2): 8 [IU] via SUBCUTANEOUS
  Administered 2015-09-12: 3 [IU] via SUBCUTANEOUS
  Administered 2015-09-12 – 2015-09-13 (×3): 8 [IU] via SUBCUTANEOUS
  Administered 2015-09-13: 11 [IU] via SUBCUTANEOUS

## 2015-09-08 MED ORDER — BISACODYL 10 MG RE SUPP: 10.0000 mg | Freq: Every day | RECTAL | Status: DC | PRN

## 2015-09-08 MED ORDER — MIDAZOLAM HCL 2 MG/2ML IJ SOLN
2.0000 mg | INTRAMUSCULAR | Status: DC | PRN
  Administered 2015-09-08 – 2015-09-19 (×14): 2 mg via INTRAVENOUS
  Filled 2015-09-08 (×13): qty 2

## 2015-09-08 MED ORDER — FAMOTIDINE IN NACL 20-0.9 MG/50ML-% IV SOLN
20.0000 mg | Freq: Two times a day (BID) | INTRAVENOUS | Status: DC
  Administered 2015-09-08 – 2015-09-09 (×3): 20 mg via INTRAVENOUS
  Filled 2015-09-08 (×3): qty 50

## 2015-09-08 MED ORDER — FENTANYL CITRATE (PF) 100 MCG/2ML IJ SOLN
100.0000 ug | INTRAMUSCULAR | Status: DC | PRN
  Administered 2015-09-08 – 2015-09-10 (×5): 100 ug via INTRAVENOUS
  Filled 2015-09-08 (×3): qty 2

## 2015-09-08 MED ORDER — ETOMIDATE 2 MG/ML IV SOLN
20.0000 mg | Freq: Once | INTRAVENOUS | Status: AC
  Administered 2015-09-08: 20 mg via INTRAVENOUS

## 2015-09-08 MED ORDER — SODIUM CHLORIDE 0.9 % IV BOLUS (SEPSIS)
1000.0000 mL | Freq: Once | INTRAVENOUS | Status: AC
  Administered 2015-09-08: 1000 mL via INTRAVENOUS

## 2015-09-08 MED ORDER — FENTANYL CITRATE (PF) 100 MCG/2ML IJ SOLN
INTRAMUSCULAR | Status: AC
  Administered 2015-09-08: 100 ug
  Filled 2015-09-08: qty 2

## 2015-09-08 MED FILL — Medication: Qty: 1 | Status: AC

## 2015-09-08 NOTE — Progress Notes (Signed)
[facility]  I spoke to Ms. Cory Salazar, the reported health care power of attorney for Mr. Cory Salazar.  I explained that he has acute respiratory failure with hypoxemia likely due to an aspiration event.  She states that though he has lived in a SNF type facility for years due to his inability to perform ADLs, he has the ability to shuffle walk and talk in fairly normal conversation about family, etc.  Further she says that he has a living will but that he has always been very focused on not having a feeding tube, but that he has NOT indicated that he wants to avoid life support or CPR.  I advised that during his hospitalization he has not been conversant or had a gag reflex and further clarified that mechanical ventilatory support will cause discomfort without fixing his underlying encephalopathy or dysphagia.  After 10 minutes or more of conversation Cory Salazar felt strongly that [name] should be placed on life support.  Will proceed with intubation.  Will need to clarify goals of care further with family.  As none of them have been present during this hospitalization I advised that it is critical they come to the bedside to participate in his care and decision making.  Heber Eastview, MD [facility] Pager: [phone] Cell: [phone] After 3pm or if no response, call [phone]

## 2015-09-08 NOTE — Progress Notes (Signed)
Patient ID: Cory Salazar, male   DOB: [birth year], 55 y.o.   MRN: [id number] Being the only Physician present on the ward at present, I was called by nurses to assist with patient who had an acute decline in respiratory condition. Upon arrival to the room, patient had a HR of 149, BP 129/ , RR of 27 and saturation of 655 on pulse oximetry. He had significantly increased work of breathing and "gurggling" could be heard without a stethoscope. A quick review of his record showed that he is a full code and has a h/o resection of brain tumor, encephalopathy, seizures. He was admitted with sepsis and is currently being treated for HCAP.  Based on initial evaluation a diagnosis of acute respiratory failure was made. Skin warm and dry with good perfusion.   AP: Acute Respiratory Failure: ABG, Critical Care Consult for possible Intubation, Ambu on 100% Oxygen. No code called as no cardio-pulmonary arrest present.   I called patients sister Cory Salazar) and notified her of patient's change in condition. She indicated that she believed that he anted to be DNR rather than being resuscitated. Her sister Cory Salazar) is listed as the POA but could not be reached. Ms. Cory Salazar states that she would reach her sister and call back at [phone]). All information discussed with Dr. Jonah Blue and Dr. Kendrick Fries from Critical Care.

## 2015-09-08 NOTE — Progress Notes (Addendum)
Inpatient Diabetes Program Recommendations  AACE/ADA: New Consensus Statement on Inpatient Glycemic Control (2015)  Target Ranges:  Prepandial:   less than 140 mg/dL      Peak postprandial:   less than 180 mg/dL (1-2 hours)      Critically ill patients:  140 - 180 mg/dL   Results for PRANIT, SOKOL (MRN [id number]) as of 09/08/2015 09:00  Ref. Range 09/07/2015 07:34 09/07/2015 12:54 09/07/2015 17:30 09/07/2015 21:38  Glucose-Capillary Latest Ref Range: 65 - 99 mg/dL 734 (H) 287 (H) 681 (H) 239 (H)   Results for ADOLF, ROSELLE (MRN [id number]) as of 09/08/2015 09:00  Ref. Range 09/08/2015 07:44  Glucose-Capillary Latest Ref Range: 65 - 99 mg/dL 597 (H)    Admit with: ARF/ Sepsis/ Pneumonia  History: DM, Brain Tumor  Home DM Meds: Lantus 20 units QHS  Current Insulin Orders: Novolog Sensitive Correction Scale/ SSI (0-9 units) TID AC + HS                                       Lantus 5 units QHS     -Glucose levels >200 mg/dl. Takes Lantus at home.     MD- Fasting glucose remains elevated today (255 mg/dl)  Please consider increasing Lantus to 20 units QHS (home dose)    --Will follow patient during hospitalization--  Ambrose Finland RN, MSN, CDE Diabetes Coordinator Inpatient Glycemic Control Team Team Pager: [phone] (8a-5p)

## 2015-09-08 NOTE — Progress Notes (Signed)
Patient ID: Cory Salazar, male   DOB: 01-Apr-1960, 55 y.o.   MRN: [id number]    Referring Physician(s): Barron Alvine  Supervising Physician: Gilmer Mor  Patient Status: inpt  Chief Complaint: Left hydronephrosis, urosepsis  Subjective: Patient just moved to ICU for respiratory failure.    Allergies: Review of patient's allergies indicates no known allergies.  Medications: Prior to Admission medications   Medication Sig Start Date End Date Taking? Authorizing Provider  acetaminophen (TYLENOL) 325 MG tablet Take 650 mg by mouth every 4 (four) hours as needed for mild pain, moderate pain or fever.   Yes Historical Provider, MD  insulin glargine (LANTUS) 100 UNIT/ML injection Inject 0.2 mLs (20 Units total) into the skin at bedtime. 09/05/15  Yes Ramonita Lab, MD  ipratropium-albuterol (DUONEB) 0.5-2.5 (3) MG/3ML SOLN Take 3 mLs by nebulization 4 (four) times daily. 09/05/15  Yes Ramonita Lab, MD  lactulose (CHRONULAC) 10 GM/15ML solution Take 30 mLs (20 g total) by mouth 2 (two) times daily. 09/05/15  Yes Ramonita Lab, MD  lamoTRIgine (LAMICTAL) 100 MG tablet Take 1 tablet (100 mg total) by mouth 2 (two) times daily. 09/05/15  Yes Ramonita Lab, MD  levETIRAcetam 500 mg in sodium chloride 0.9 % 100 mL Inject 500 mg into the vein every 12 (twelve) hours. 09/05/15  Yes Ramonita Lab, MD  LORazepam (ATIVAN) 2 MG/ML injection Inject 0.5 mLs (1 mg total) into the vein every 4 (four) hours as needed for seizure. 09/05/15  Yes Ramonita Lab, MD  mirtazapine (REMERON) 15 MG tablet Take 1 tablet (15 mg total) by mouth at bedtime. 09/05/15  Yes Ramonita Lab, MD  phenytoin (DILANTIN) 50 MG tablet Chew 6 tablets (300 mg total) by mouth at bedtime. 07/10/15 07/09/16 Yes Jeanmarie Plant, MD  piperacillin-tazobactam (ZOSYN) 3.375 GM/50ML IVPB Inject 50 mLs (3.375 g total) into the vein every 8 (eight) hours. 09/05/15  Yes Ramonita Lab, MD  potassium chloride SA (K-DUR,KLOR-CON) 20 MEQ tablet Take 20 mEq by mouth 2 (two)  times daily.   Yes Historical Provider, MD  QUEtiapine (SEROQUEL) 200 MG tablet Take 200 mg by mouth 2 (two) times daily. **Medication on hold from 09/01/15 to 09/04/15 per MD at [facility]**   Yes Historical Provider, MD  sertraline (ZOLOFT) 50 MG tablet Take 50 mg by mouth daily.   Yes Historical Provider, MD  sodium chloride 0.45 % solution Inject 100 mLs into the vein continuous. 09/05/15  Yes Ramonita Lab, MD  sodium chloride flush (NS) 0.9 % SOLN Inject 3 mLs into the vein every 12 (twelve) hours. 09/05/15  Yes Ramonita Lab, MD  tamsulosin (FLOMAX) 0.4 MG CAPS capsule Take 0.4 mg by mouth daily.   Yes Historical Provider, MD    Vital Signs: BP 92/66 (BP Location: Left Arm)   Pulse (!) 132   Temp 98.4 F (36.9 C) (Axillary)   Resp 18   Ht 6\' 1"  (1.854 m)   Wt 137 lb 2 oz (62.2 kg)   SpO2 99%   BMI 18.09 kg/m   Physical Exam: Abd: left PCN drain in place.  Drain site is c/d/i.  Bag with slightly blood-tinged clear urine.  Imaging: Ct Abdomen Pelvis Wo Contrast  Result Date: 09/05/2015 CLINICAL DATA:  Left-sided hydronephrosis EXAM: CT ABDOMEN AND PELVIS WITHOUT CONTRAST TECHNIQUE: Multidetector CT imaging of the abdomen and pelvis was performed following the standard protocol without IV contrast. COMPARISON:  Renal ultrasound - 09/04/2015 FINDINGS: The lack of intravenous contrast limits the ability to evaluate solid abdominal organs.  Examination is further degraded secondary to patient respiratory artifact. Lower chest: Limited visualization of the lower thorax demonstrates dependent subpleural ground-glass atelectasis. Airspace opacities are seen within the right lower lobe with associated air bronchograms. Additionally, there are ill-defined nodules seen within the right lower lobe which appear to demonstrate a tree-in-bud pattern. No pleural effusion. Normal heart size. No pericardial effusion. Decreased attenuation of the intra cardiac blood pool suggestive of anemia.  Hepatobiliary: Normal hepatic contour. The gallbladder appears filled with radiopaque material suggestive of sludge. No definite ascites. Pancreas: Normal noncontrast appearance of the pancreas Spleen: Normal noncontrast appearance of the spleen. Incidental common of a small splenule. Adrenals/Urinary Tract: Note is made of a punctate (approximately 0.5 x 0.6 cm) stone within the superior aspects of the left ureter (axial image 55, series 2; coronal image 64, series 602) which results in mild upstream ureterectasis and asymmetric left-sided pelvicaliectasis. No additional evidence of left-sided nephrolithiasis. Solitary punctate (approximately 2 mm) nonobstructing stone within the superior pole of the right kidney (image 40, series 2). No evidence of right-sided urinary obstruction. No renal stones are seen along expected course of the right ureter. The urinary bladder is decompressed with a Foley catheter. Several prominent phleboliths are seen within the left hemipelvis. Stomach/Bowel: A rectal catheter is in place. There is diffuse thickening of the rectal wall, potentially reactive. Moderate colonic stool burden without evidence of enteric obstruction. Normal noncontrast appearance of the terminal ileum. The appendix is not visualized, however there is no asymmetric right-sided very cecal inflammatory change. No pneumoperitoneum, pneumatosis or portal venous gas. Vascular/Lymphatic: Minimal amount of atherosclerotic plaque within a normal caliber abdominal aorta. A central venous catheter tip terminates within the peripheral aspects of the left common iliac vein. No definitive bulky retroperitoneal, mesenteric, pelvic or inguinal lymphadenopathy on this noncontrast examination. Reproductive: No evidence approximately on this noncontrast examination. No free fluid in the pelvic cul-de-sac. Other: Mild diffuse body wall anasarca. Musculoskeletal: No acute or aggressive osseous abnormalities. Stigmata of DISH  within the thoracic spine. IMPRESSION: 1. Punctate (approximately 0.6 cm) stone within the superior aspect of the left ureter results in very mild upstream left-sided ureterectasis and pelvicaliectasis. 2. Additional solitary punctate (approximately 2 mm) nonobstructing right-sided renal stone. No evidence of right-sided urinary obstruction. 3. Bibasilar atelectasis with potential developing airspace opacity within the right lower lobe. Clinical correlation is advised. 4. Diffuse thickening of the rectal wall, potentially reactive secondary to placement of a rectal catheter though an enteritis could have a similar appearance. Clinical correlation is advised. No evidence of enteric obstruction. Electronically Signed   By: Simonne Come M.D.   On: 09/05/2015 21:21  Dg Chest 1 View  Result Date: 09/05/2015 CLINICAL DATA:  Unsuccessful central line placement. EXAM: CHEST 1 VIEW COMPARISON:  Radiograph of same day. FINDINGS: The heart size and mediastinal contours are within normal limits. No pleural effusion is noted. Right lung is clear. Possible pleural line is seen in left lung apex which may represent small apical pneumothorax. The visualized skeletal structures are unremarkable. IMPRESSION: Possible small left apical pneumothorax. Follow-up radiographs are recommended. Critical Value/emergent results were called by telephone at the time of interpretation on 09/05/2015 at 1:34 pm to Bennett Scrape, nurse practitioner, who verbally acknowledged these results and will immediately contact Dr. Nicholos Johns. Electronically Signed   By: Lupita Raider, M.D.   On: 09/05/2015 13:36  Dg Chest Port 1 View  Result Date: 09/08/2015 CLINICAL DATA:  Endotracheal placement. EXAM: PORTABLE CHEST 1 VIEW COMPARISON:  09/06/2015 FINDINGS:  Endotracheal tube has its tip 5 cm above the carina. Nasogastric tube enters the stomach. Left subclavian central line has its tip in the SVC above the right atrium. Heart size is normal. The  left lung is clear. There is infiltrate in the right lower lobe. IMPRESSION: Lines and tubes well positioned. Endotracheal tube 5 cm above the carina. Right lower lobe pneumonia persists. Electronically Signed   By: Paulina Fusi M.D.   On: 09/08/2015 13:34  Dg Chest Port 1 View  Result Date: 09/06/2015 CLINICAL DATA:  Pneumothorax. EXAM: PORTABLE CHEST 1 VIEW COMPARISON:  09/05/2015 . FINDINGS: Mediastinum hilar structures normal. Mild right base subsegmental atelectasis and or infiltrate. Small right pleural effusion. Heart size normal. IMPRESSION: Mild right base subsegmental atelectasis and or infiltrate. Right base pneumonia cannot be excluded. Similar finding noted on prior exam. Chest is otherwise stable. No pneumothorax. Electronically Signed   By: Maisie Fus  Register   On: 09/06/2015 07:15  Dg Chest Port 1 View  Result Date: 09/05/2015 CLINICAL DATA:  Chart states that there have been several attempts to place a "left" side IJ PICC line and there was concern for a possible pneumothorax. Pt has a medium sized bandage on right side of neck, suggesting that IJ PICC was attempted on right side. Ordering MD looked at image outside of patient's room directly after I took it. Hx of diabetes. Pt was not able to verbally respond. EXAM: PORTABLE CHEST 1 VIEW COMPARISON:  09/05/2015 at 12:52 a.m. FINDINGS: There is now no evidence of a left pneumothorax. The previously seen linear opacity near the left apex was likely an artifact. Mild opacity at the right lung base is consistent with atelectasis. Consider pneumonia if there are consistent clinical symptoms. Lungs otherwise clear. No pleural effusion. Heart, mediastinum and hila are unremarkable. IMPRESSION: 1. No evidence of a pneumothorax on the current study. 2. Right lung base opacity most likely atelectasis. Pneumonia is possible. Electronically Signed   By: Amie Portland M.D.   On: 09/05/2015 17:38  Portable Chest 1 View  Result Date: 09/05/2015 CLINICAL  DATA:  Pneumonia EXAM: PORTABLE CHEST 1 VIEW COMPARISON:  September 04, 2015 FINDINGS: There is patchy airspace consolidation in the right base. There is new mild atelectatic change in the left base, possibly represent a second focus of early pneumonia. Lungs elsewhere clear. Heart is upper normal in size with pulmonary vascularity within normal limits. No adenopathy. No bone lesions. IMPRESSION: Patchy airspace opacity in the right apex consistent with a degree of pneumonia. Atelectasis is in the left lower lobe, new ; question a second focus of pneumonia developing in the left base. Lungs elsewhere clear. Stable cardiac silhouette. Electronically Signed   By: Bretta Bang III M.D.   On: 09/05/2015 07:15  Ir Nephrostomy Placement Left  Result Date: 09/06/2015 INDICATION: Left hydronephrosis, urosepsis and left ureteral calculus. EXAM: IR NEPHROSTOMY PLACEMENT LEFT COMPARISON:  CT of the abdomen and pelvis on 09/05/2015 MEDICATIONS: 2 g IV Ancef. As antibiotic prophylaxis, Ancef was ordered pre-procedure and administered intravenously within one hour of incision. ANESTHESIA/SEDATION: Fentanyl 75 mcg IV; Versed 2.0 mg IV Moderate Sedation Time:  13 minutes. The patient was continuously monitored during the procedure by the interventional radiology nurse under my direct supervision. CONTRAST:  10 mL Isovue-300 - administered into the collecting system(s) FLUOROSCOPY TIME:  Fluoroscopy Time: 2 minutes and 54 seconds. COMPLICATIONS: None immediate. PROCEDURE: Informed written consent was obtained from the patient's sister after a thorough discussion of the procedural risks, benefits and alternatives.  All questions were addressed. Maximal Sterile Barrier Technique was utilized including caps, mask, sterile gowns, sterile gloves, sterile drape, hand hygiene and skin antiseptic. A timeout was performed prior to the initiation of the procedure. Ultrasound was used to localize the left kidney. Under direct ultrasound  guidance, a 21 gauge needle was advanced into the lower pole collecting system. Contrast was injected after aspiration of urine. A guidewire was advanced. A transitional dilator was placed. The tract was dilated over a guidewire. A 10 French nephrostomy tube was advanced. Tube position was confirmed by fluoroscopy. The catheter was connected to a gravity drainage bag. It was secured at the skin with a Prolene retention suture and StatLock device. FINDINGS: Ultrasound shows mild left-sided hydronephrosis. After nephrostomy tube placement, there was some bloody in urine return related to bleeding in the collecting system with tube advancement. Due to small size of the renal pelvis, the nephrostomy tube could not be completely formed and was left partially extending into the proximal ureter. IMPRESSION: Left percutaneous nephrostomy tube placement with a 10 French catheter placed and advanced into the proximal ureter. This will be left to gravity bag drainage. Electronically Signed   By: Irish Lack M.D.   On: 09/06/2015 17:04   Labs:  CBC:  Recent Labs  09/05/15 1741 09/06/15 0337 09/07/15 0607 09/08/15 0839  WBC 13.5* 14.5* 11.9* 16.6*  HGB 10.0* 10.9* 11.2* 10.8*  HCT 32.0* 34.3* 34.2* 32.9*  PLT 73* 84* 72* 78*    COAGS:  Recent Labs  09/06/15 0337  INR 1.48    BMP:  Recent Labs  09/05/15 1741 09/06/15 0337 09/07/15 0607 09/08/15 0839  NA 144 146* 147* 151*  K 4.1 4.3 4.1 3.2*  CL 115* 119* 121* 125*  CO2 14* 13* 14* 14*  GLUCOSE 227* 291* 263* 267*  BUN 125* 127* 119* 104*  CALCIUM 8.6* 8.6* 8.6* 8.5*  CREATININE 8.59* 8.00* 6.47* 4.86*  GFRNONAA 6* 7* 9* 12*  GFRAA 7* 8* 10* 14*    LIVER FUNCTION TESTS:  Recent Labs  07/10/15 1231 09/04/15 1102 09/05/15 0059 09/05/15 1006 09/05/15 1317 09/06/15 0337  BILITOT 0.4 3.1* 3.5*  --   --  1.8*  AST --   --  11*  ALT --   --  15*  ALKPHOS 141* 175* 120  --   --  83  PROT 8.1 8.0 6.2*  --    --  6.0*  ALBUMIN 3.9 2.8* 2.2* 2.0* 2.0* 2.3*    Assessment and Plan: 1. Left hydronephrosis, urosepsis, s/p PCN 7/25 -creatinine responding well to PCN.  Down to 4.86 today from 8 on day of placement -cont drain care  Electronically Signed: Samani Deal E 09/08/2015, 2:16 PM   I spent a total of 15 Minutes at the the patient's bedside AND on the patient's hospital floor or unit, greater than 50% of which was counseling/coordinating care for left hydronephrosis, urosepsis

## 2015-09-08 NOTE — Progress Notes (Signed)
Speech Language Pathology Treatment: Dysphagia  Patient Details Name: Cory Salazar MRN: [id number] DOB: 10-22-1960 Today's Date: 09/08/2015 Time: 1100-1110 SLP Time Calculation (min) (ACUTE ONLY): 10 min  Assessment / Plan / Recommendation Clinical Impression  Pt seen to assess for ability to consume PO. Upon SLP arrival pt with eyes open but not responding to visual stimuli, respirations rapid and profoundly congested. Oral care performed with successful removal of coated bloody secretions from lingual cavity. Pt tightened mouth when SLP touched him, but with pressure to the lower mandible and passage of the swab when front teeth are missing, oral care could be successfully completed. Suction applied to the posterior oral cavity and into pharynx elicited no gag or cough and no secretions were retrieved. Overall, pt is not protecting his airway or managing his secretions and is not capable of swallowing any PO. Trials were not attempted due to high risk of aspiration. Suggest palliative care referral. Will follow for potential change in status.    HPI HPI: 55 year old male admitted 09/05/15 due to sepsis. PMH significant for Brain cancer, encephalopathy, AKI, fever, UTI. Pt now with right PNA.      SLP Plan  Continue with current plan of care     Recommendations  Diet recommendations: NPO             Plan: Continue with current plan of care     GO               [facility], [place] CCC-SLP [phone]  Claudine Mouton 09/08/2015, 11:12 AM

## 2015-09-08 NOTE — Progress Notes (Signed)
NUTRITION NOTE  Pt transferred from the floor to ICU and was subsequently intubated ~1315 d/t respiratory failure. RD saw pt for full follow-up with associated note written at 1105 today. Notes indicate that pt does not desire PEG. OGT in place at this time.   Estimated nutrition needs re-estimated: Kcal: 1843 kcal Protein: 75-93 grams (1.2-1.5 grams/kg)  Unable to determine PO intakes PTA and pt has been NPO since admission except for dinner 7/24; no documented intakes during this meal but unlikely that pt was able to eat given current medical status. He may be at risk for refeeding.   TF recommendations via OGT: Jevity 1.2 @ goal rate of 65 mL/hr with 30 mL free water every 4 hours. Initiate Jevity 1.2 @ 25 mL/hr and increase by 10 mL every 4 hours to reach goal rate of 65 mL/hr. At goal rate + flush, regimen will provide 1872 kcal, 86 grams of protein, and 1439 mL free water.    RD will follow-up 7/28.    Trenton Gammon, MS, RD, LDN Inpatient Clinical Dietitian Pager # [phone] After hours/weekend pager # [phone]

## 2015-09-08 NOTE — Progress Notes (Signed)
PROGRESS NOTE    Cory Salazar  WUJ:[id number] DOB: [birth year] DOA: 09/05/2015 PCP: No primary care provider on file.    Brief Narrative:  HPI on admission by Dr. Judithann Sheen: Cory Salazar is a 55 y.o. 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 In ER, pt was tachycardic and febrile. WBC=17k. CXR shows right-sided pneumonia. He is now admitted. Pt is unable to provide history due to mental status. Aslo noted in ER to be in ARF with poor urine output.  Narrative from Dr. Cena Benton on 7/26: Pt was transitioned to [facility] for further evaluation and recommendations given limitations of care at [facility]. Urology is aware and critical care consulted. Patient will be admitted to step down for further care. Unable to obtain history from patient.   IR consulted pt s/p percutaneous nephrostomy tube placement.  Patient was transferred to telemetry floor yesterday.  Upon my evaluation this AM, he has significant ronchorous breath sounds and the RN requested a scopolamine patch for excessive secretions (provided).  He did not make eye contact and did not attempt to interact in any way, but otherwise did not appear to be in distress.    Later in the morning, however, I was called urgently to the bedside for the patient in acute respiratory distress.  The nurse had been walking by and noticed the patient struggling to breathe.  O2 sats at that time were in the 40s.  He was placed on a NRB mask and RRT was called.  Upon my arrival he was gray with extremely labored breathing.  Despite NRB, O2 sats were in 70s  BVM ventilations were initiated with some improvement.  Dr. Ashley Royalty was present prior to my arrival (please see her note) and was extremely helpful both in the urgent situation and also in contacting family.  Dr. Kendrick Fries also arrived and assumed care of the patient and facilitated transfer (while awaiting HCPOA decision  about whether to intubate).  The patient has been intubated and is now under the care of CCM.   Assessment & Plan:   Active Problems:   HCAP (healthcare-associated pneumonia)   ARF (acute renal failure) (HCC)   Seizure disorder (HCC)   Pressure ulcer   Sepsis (HCC)   Hydronephrosis   Pneumothorax on left   Malnutrition of moderate degree  HCAP, likely aspiration as cause -Patient is now intubated and on CCM service -Antibiotics broadened for empiric coverage -Will await return to [facility] by CCM and then resume care  ARF -Improved on an ongoing basis since admission -Nephrostomy tube placed on L  Seizure D/O Stable     DVT prophylaxis: heparin Code Status: Full Family Communication: none at bedside Disposition Plan: transferred back to ICU, CCM to assume care   Consultants:   Urology  IR Critical care team.    Antimicrobials:  Unasyn   Subjective: See above - patient unable to speak this AM  Objective: Vitals:   09/08/15 1301 09/08/15 1302 09/08/15 1305 09/08/15 1306  BP: (!) 87/61 (!) 88/65  92/66  Pulse: (!) 132 (!) 132  (!) 132  Resp: 18 18  18   Temp:      TempSrc:      SpO2: 98% 98% 97% 99%  Weight:    62.2 kg (137 lb 2 oz)  Height:    6\' 1"  (1.854 m)    Intake/Output Summary (Last 24 hours) at 09/08/15 1348 Last data filed at  09/08/15 1225  Gross per 24 hour  Intake          1932.25 ml  Output             3125 ml  Net         -1192.75 ml   Filed Weights   09/06/15 1030 09/08/15 1228 09/08/15 1306  Weight: 65 kg (143 lb 4.8 oz) 62.2 kg (137 lb 2 oz) 62.2 kg (137 lb 2 oz)    Examination:  General exam: Appears calm and comfortable but no eye contact or attempt to engage/interact Respiratory system: Respiratory effort normal at time of initial exam.  Coarse breath sounds diffusely. Cardiovascular system: RRR. Distant due to significant respiratory noise. No pedal edema. Gastrointestinal system: Abdomen is nondistended,  soft and nontender. No organomegaly or masses felt. Normal bowel sounds heard. Extremities: SCDs in place, foam boots in place for heels Skin: No rashes, lesions or ulcers      Data Reviewed: I have personally reviewed following labs and imaging studies  CBC:  Recent Labs Lab 09/04/15 1102 09/05/15 0059 09/05/15 1741 09/06/15 0337 09/07/15 0607 09/08/15 0839  WBC 17.3* 15.1* 13.5* 14.5* 11.9* 16.6*  NEUTROABS 15.7*  --   --   --   --  14.1*  HGB 12.5* 10.7* 10.0* 10.9* 11.2* 10.8*  HCT 38.5* 32.6* 32.0* 34.3* 34.2* 32.9*  MCV 78.1* 78.1* 79.8 79.4 76.2* 76.5*  PLT 147* 82* 73* 84* 72* 78*   Basic Metabolic Panel:  Recent Labs Lab 09/05/15 1006 09/05/15 1317 09/05/15 1741 09/06/15 0337 09/07/15 0607 09/08/15 0839  NA 143 144 144 146* 147* 151*  K 4.1 4.0 4.1 4.3 4.1 3.2*  CL 115* 115* 115* 119* 121* 125*  CO2 13* 11* 14* 13* 14* 14*  GLUCOSE 228* 220* 227* 291* 263* 267*  BUN 126* 129* 125* 127* 119* 104*  CREATININE 9.57* 8.94* 8.59* 8.00* 6.47* 4.86*  CALCIUM 8.9 8.4* 8.6* 8.6* 8.6* 8.5*  MG  --   --  3.0*  --   --   --   PHOS 5.6* 5.4* 6.0*  --   --   --    GFR: Estimated Creatinine Clearance: 15.5 mL/min (by C-G formula based on SCr of 4.86 mg/dL). Liver Function Tests:  Recent Labs Lab 09/04/15 1102 09/05/15 0059 09/05/15 1006 09/05/15 1317 09/06/15 0337  AST 30 24  --   --  11*  ALT 20 17  --   --  15*  ALKPHOS 175* 120  --   --  83  BILITOT 3.1* 3.5*  --   --  1.8*  PROT 8.0 6.2*  --   --  6.0*  ALBUMIN 2.8* 2.2* 2.0* 2.0* 2.3*   No results for input(s): LIPASE, AMYLASE in the last 168 hours. No results for input(s): AMMONIA in the last 168 hours. Coagulation Profile:  Recent Labs Lab 09/06/15 0337  INR 1.48   Cardiac Enzymes: No results for input(s): CKTOTAL, CKMB, CKMBINDEX, TROPONINI in the last 168 hours. BNP (last 3 results) No results for input(s): PROBNP in the last 8760 hours. HbA1C: No results for input(s): HGBA1C in the  last 72 hours. CBG:  Recent Labs Lab 09/07/15 1254 09/07/15 1730 09/07/15 2138 09/08/15 0744 09/08/15 1150  GLUCAP 211* 225* 239* 255* 311*   Lipid Profile: No results for input(s): CHOL, HDL, LDLCALC, TRIG, CHOLHDL, LDLDIRECT in the last 72 hours. Thyroid Function Tests: No results for input(s): TSH, T4TOTAL, FREET4, T3FREE, THYROIDAB in the last 72 hours. Anemia Panel:  No results for input(s): VITAMINB12, FOLATE, FERRITIN, TIBC, IRON, RETICCTPCT in the last 72 hours. Urine analysis:    Component Value Date/Time   COLORURINE AMBER (A) 09/04/2015 1125   APPEARANCEUR CLOUDY (A) 09/04/2015 1125   LABSPEC 1.014 09/04/2015 1125   PHURINE 5.0 09/04/2015 1125   GLUCOSEU 150 (A) 09/04/2015 1125   HGBUR 2+ (A) 09/04/2015 1125   BILIRUBINUR NEGATIVE 09/04/2015 1125   KETONESUR NEGATIVE 09/04/2015 1125   PROTEINUR 100 (A) 09/04/2015 1125   NITRITE NEGATIVE 09/04/2015 1125   LEUKOCYTESUR 3+ (A) 09/04/2015 1125   Sepsis Labs: @LABRCNTIP (procalcitonin:4,lacticidven:4)  ) Recent Results (from the past 240 hour(s))  Blood Culture (routine x 2)     Status: None (Preliminary result)   Collection Time: 09/04/15 11:00 AM  Result Value Ref Range Status   Specimen Description BLOOD RIGHT ARM  Final   Special Requests BOTTLES DRAWN AEROBIC AND ANAEROBIC 5CC  Final   Culture NO GROWTH 4 DAYS  Final   Report Status PENDING  Incomplete  Blood Culture (routine x 2)     Status: None (Preliminary result)   Collection Time: 09/04/15 11:05 AM  Result Value Ref Range Status   Specimen Description BLOOD LEFT ARM  Final   Special Requests BOTTLES DRAWN AEROBIC AND ANAEROBIC 5CC  Final   Culture NO GROWTH 4 DAYS  Final   Report Status PENDING  Incomplete  Urine culture     Status: None   Collection Time: 09/04/15 11:25 AM  Result Value Ref Range Status   Specimen Description URINE, RANDOM  Final   Special Requests Normal  Final   Culture NO GROWTH Performed at [facility]   Final    Report Status 09/05/2015 FINAL  Final  MRSA PCR Screening     Status: None   Collection Time: 09/04/15  2:19 PM  Result Value Ref Range Status   MRSA by PCR NEGATIVE NEGATIVE Final    Comment:        The GeneXpert MRSA Assay (FDA approved for NASAL specimens only), is one component of a comprehensive MRSA colonization surveillance program. It is not intended to diagnose MRSA infection nor to guide or monitor treatment for MRSA infections.   C difficile quick scan w PCR reflex     Status: None   Collection Time: 09/05/15 10:11 AM  Result Value Ref Range Status   C Diff antigen NEGATIVE NEGATIVE Final   C Diff toxin NEGATIVE NEGATIVE Final   C Diff interpretation No C. difficile detected.  Final  Gastrointestinal Panel by PCR , Stool     Status: None   Collection Time: 09/05/15 10:11 AM  Result Value Ref Range Status   Campylobacter species NOT DETECTED NOT DETECTED Final   Plesimonas shigelloides NOT DETECTED NOT DETECTED Final   Salmonella species NOT DETECTED NOT DETECTED Final   Yersinia enterocolitica NOT DETECTED NOT DETECTED Final   Vibrio species NOT DETECTED NOT DETECTED Final   Vibrio cholerae NOT DETECTED NOT DETECTED Final   Enteroaggregative E coli (EAEC) NOT DETECTED NOT DETECTED Final   Enteropathogenic E coli (EPEC) NOT DETECTED NOT DETECTED Final   Enterotoxigenic E coli (ETEC) NOT DETECTED NOT DETECTED Final   Shiga like toxin producing E coli (STEC) NOT DETECTED NOT DETECTED Final   E. coli O157 NOT DETECTED NOT DETECTED Final   Shigella/Enteroinvasive E coli (EIEC) NOT DETECTED NOT DETECTED Final   Cryptosporidium NOT DETECTED NOT DETECTED Final   Cyclospora cayetanensis NOT DETECTED NOT DETECTED Final   Entamoeba histolytica  NOT DETECTED NOT DETECTED Final   Giardia lamblia NOT DETECTED NOT DETECTED Final   Adenovirus F40/41 NOT DETECTED NOT DETECTED Final   Astrovirus NOT DETECTED NOT DETECTED Final   Norovirus GI/GII NOT DETECTED NOT DETECTED Final    Rotavirus A NOT DETECTED NOT DETECTED Final   Sapovirus (I, II, IV, and V) NOT DETECTED NOT DETECTED Final         Radiology Studies: Dg Chest Port 1 View  Result Date: 09/08/2015 CLINICAL DATA:  Endotracheal placement. EXAM: PORTABLE CHEST 1 VIEW COMPARISON:  09/06/2015 FINDINGS: Endotracheal tube has its tip 5 cm above the carina. Nasogastric tube enters the stomach. Left subclavian central line has its tip in the SVC above the right atrium. Heart size is normal. The left lung is clear. There is infiltrate in the right lower lobe. IMPRESSION: Lines and tubes well positioned. Endotracheal tube 5 cm above the carina. Right lower lobe pneumonia persists. Electronically Signed   By: Paulina Fusi M.D.   On: 09/08/2015 13:34  Ir Nephrostomy Placement Left  Result Date: 09/06/2015 INDICATION: Left hydronephrosis, urosepsis and left ureteral calculus. EXAM: IR NEPHROSTOMY PLACEMENT LEFT COMPARISON:  CT of the abdomen and pelvis on 09/05/2015 MEDICATIONS: 2 g IV Ancef. As antibiotic prophylaxis, Ancef was ordered pre-procedure and administered intravenously within one hour of incision. ANESTHESIA/SEDATION: Fentanyl 75 mcg IV; Versed 2.0 mg IV Moderate Sedation Time:  13 minutes. The patient was continuously monitored during the procedure by the interventional radiology nurse under my direct supervision. CONTRAST:  10 mL Isovue-300 - administered into the collecting system(s) FLUOROSCOPY TIME:  Fluoroscopy Time: 2 minutes and 54 seconds. COMPLICATIONS: None immediate. PROCEDURE: Informed written consent was obtained from the patient's sister after a thorough discussion of the procedural risks, benefits and alternatives. All questions were addressed. Maximal Sterile Barrier Technique was utilized including caps, mask, sterile gowns, sterile gloves, sterile drape, hand hygiene and skin antiseptic. A timeout was performed prior to the initiation of the procedure. Ultrasound was used to localize the left kidney.  Under direct ultrasound guidance, a 21 gauge needle was advanced into the lower pole collecting system. Contrast was injected after aspiration of urine. A guidewire was advanced. A transitional dilator was placed. The tract was dilated over a guidewire. A 10 French nephrostomy tube was advanced. Tube position was confirmed by fluoroscopy. The catheter was connected to a gravity drainage bag. It was secured at the skin with a Prolene retention suture and StatLock device. FINDINGS: Ultrasound shows mild left-sided hydronephrosis. After nephrostomy tube placement, there was some bloody in urine return related to bleeding in the collecting system with tube advancement. Due to small size of the renal pelvis, the nephrostomy tube could not be completely formed and was left partially extending into the proximal ureter. IMPRESSION: Left percutaneous nephrostomy tube placement with a 10 French catheter placed and advanced into the proximal ureter. This will be left to gravity bag drainage. Electronically Signed   By: Irish Lack M.D.   On: 09/06/2015 17:04       Scheduled Meds: . ampicillin-sulbactam (UNASYN) IV  1.5 g Intravenous BID  . antiseptic oral rinse  7 mL Mouth Rinse q12n4p  . chlorhexidine  15 mL Mouth Rinse BID  . famotidine (PEPCID) IV  20 mg Intravenous Q12H  . insulin aspart  0-15 Units Subcutaneous Q4H  . insulin glargine  5 Units Subcutaneous QHS  . lactulose  20 g Oral BID  . lamoTRIgine  100 mg Oral BID  . levETIRAcetam (KEPPRA) IVPB  500 mg Intravenous Q12H  . phenytoin (DILANTIN) IV  300 mg Intravenous QHS  . scopolamine  1 patch Transdermal Q72H  . sertraline  50 mg Oral Daily  . sodium chloride flush  3 mL Intravenous Q12H  . tamsulosin  0.4 mg Oral Daily   Continuous Infusions: . dextrose 5 % and 0.45% NaCl 75 mL/hr at 09/08/15 1342     LOS: 3 days    Time spent: 50 min    Jonah Blue, MD [facility]   If 7PM-7AM, please contact  night-coverage [email or web address] Password [email or web address] 09/08/2015, 1:48 PM

## 2015-09-08 NOTE — Procedures (Signed)
Central Venous Catheter Insertion Procedure Note Nicholaas Heichel [id number] 15-Jun-1960  Procedure: Insertion of Central Venous Catheter Indications: Assessment of intravascular volume, inability to place peripheral IV and need for multiple IV medications  Procedure Details Consent: Unable to obtain consent because of emergent medical necessity. Time Out: Verified patient identification, verified procedure, site/side was marked, verified correct patient position, special equipment/implants available, medications/allergies/relevent history reviewed, required imaging and test results available.  Performed  Maximum sterile technique was used including antiseptics, cap, gloves, gown, hand hygiene, mask and sheet. Skin prep: Chlorhexidine; local anesthetic administered A antimicrobial bonded/coated triple lumen catheter was placed in the left subclavian vein using the Seldinger technique.  Ultrasound was used to verify the patency of the vein and for real time needle guidance.  Evaluation Blood flow good Complications: No apparent complications Patient did tolerate procedure well. Chest X-ray ordered to verify placement.  CXR: pending.  [name] 09/08/2015, 1:15 PM

## 2015-09-08 NOTE — Progress Notes (Signed)
Pharmacy Antibiotic Note  Cory Salazar is a 55 y.o. male admitted on 09/05/2015 with acute renal failure, sepsis, HCAP, UTI, hydronephrosis requiring nephrostomy (09/06/15).  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  He was initially started on broad spectrum antibiotics with Vancomycin and Zosyn, but was narrowed to Unasyn (unable to tolerate PO Augmentin d/t encephalopathy).  Today, he required intubation for suspected aspiration event.  Pharmacy has been consulted for continued Unasyn dosing.  Plan:  Unasyn 1.5 g IV q12h  Follow up renal fxn, culture results, and clinical course.   Height: 6\' 1"  (185.4 cm) Weight: 137 lb 2 oz (62.2 kg) IBW/kg (Calculated) : 79.9  Temp (24hrs), Avg:98.6 F (37 C), Min:98.3 F (36.8 C), Max:99.2 F (37.3 C)   Recent Labs Lab 09/04/15 1105 09/04/15 1418 09/05/15 0059  09/05/15 1317 09/05/15 1741 09/06/15 0337 09/07/15 0607 09/08/15 0839  WBC  --   --  15.1*  --   --  13.5* 14.5* 11.9* 16.6*  CREATININE  --  8.66* 9.08*  < > 8.94* 8.59* 8.00* 6.47* 4.86*  LATICACIDVEN 1.9 2.9* 1.1  --   --   --   --   --   --   < > = values in this interval not displayed.  Estimated Creatinine Clearance: 15.5 mL/min (by C-G formula based on SCr of 4.86 mg/dL).    No Known Allergies  Antimicrobials this admission: 7/23 Vancomycin>>7/25 7/23 Zosyn>>7/25 7/25 Augmentin >> 7/26 7/26 Unasyn >>   Dose adjustments this admission:  Microbiology results: 7/23BCx: NGTD 7/23UCx: NGF  7/23 MRSA PCR: negative 7/24 CDiff antigen / toxin negative 7/24 GI panel negative  Thank you for allowing pharmacy to be a part of this patient's care.  Cory Salazar PharmD, BCPS Pager [phone] 09/08/2015 1:43 PM

## 2015-09-08 NOTE — Progress Notes (Signed)
CSW spoke with pt's sister, Darel Hong [phone] , via phone to confirm living arrangements. Pt is from [facility] and has been living their since early June. Pt's family is happy with the facility and would like for him to return. CSW will help facilitate DC back to [facility]. Please consult CSW for any DC needs.   Stacy Gardner, LCSWA Clinical Social Worker [phone]

## 2015-09-08 NOTE — Progress Notes (Signed)
Nutrition Follow-up  DOCUMENTATION CODES:   Non-severe (moderate) malnutrition in context of chronic illness  INTERVENTION:  - Diet advancement per SLP -If Nutrition Support is required, recommend begin Jevity 1.2 @ 71mL/hr increase by 10 every 4 hours to goal rate of 81mL/hr -250cc free water every 8 hours -At goal, provides 1728 calories, 80 grams of protein, and 1912cc free water - RD will continue to monitor  NUTRITION DIAGNOSIS:   Inadequate oral intake related to inability to eat as evidenced by NPO status. -ongoing  GOAL:   Patient will meet greater than or equal to 90% of their needs -not meeting  MONITOR:   Diet advancement, Weight trends, Labs, Skin, I & O's  REASON FOR ASSESSMENT:   Low Braden    ASSESSMENT:   55 y.o. 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 In ER, pt was tachycardic and febrile. WBC=17k. CXR shows right-sided pneumonia. Pt was transitioned to [facility] for further evaluation and recommendations given limitations of care at [facility]. Urology is aware and critical care consulted. Patient will be admitted to step down for further care. Unable to obtain history from patient.  Mr. Acri continues to be NPO, non-verbal, no family at bedside. Initially, HD cath was placed with plans to begin CRRT. Per Nephrology note, pt's creatinine is improving. Discussed with RN who had no orders for CRRT at this time.  Pt continues to be a full code, thus, he may require nutrition support. Initially pt was non-verbal with SLP and refused to open his jaw, they recommended consideration of non-oral feeding.  Spoke with SLP today, stated she was able to suction and perform oral care today, pt has no gag reflex and lacks airway protection. RD will continue to monitor.  Labs and medications reviewed: D5 @ 77mL/hr --> 306 calories CBGs 255 Na 151, K 3.2, Bilirubin  1.8, Diet Order:     Skin:  Wound (see comment) (Stage 1 L heel pressure injury, DTIs to bilateral buttocks)  Last BM:  7/24  Height:   Ht Readings from Last 1 Encounters:  09/06/15 6\' 1"  (1.854 m)    Weight:   Wt Readings from Last 1 Encounters:  09/06/15 143 lb 4.8 oz (65 kg)    Ideal Body Weight:  83.64 kg (kg)  BMI:  Body mass index is 18.91 kg/m.  Estimated Nutritional Needs:   Kcal:  1650-1950 (25-30 cal/kg)  Protein:  65-80 grams (1-1.2 g/kg)  Fluid:  2 L/day  EDUCATION NEEDS:   No education needs identified at this time  Dionne Ano. Mads Borgmeyer, MS, RD LDN Inpatient Clinical Dietitian Pager [phone]

## 2015-09-08 NOTE — Progress Notes (Signed)
At 1145 this RN entered room to round on patient, patient was observed to be having difficulty breathing. Dinamap was obtained to check patients vital signs, Oxygen level was 47-49% on room air. Patient was placed on a non re breather mask, rapid response was called and Dr Ophelia Charter was notified. Crash cart was brought to room, Dr Ashley Royalty assisted with patients care until RR RN & Dr Ophelia Charter were present. Patient was assisted with breathing with Ambu bag until oxygen level was improved and 90-94%. Dr Kendrick Fries from [facility] assisted with patients care and it was determined that he would be transferred to the ICU.

## 2015-09-08 NOTE — Progress Notes (Signed)
Date:  September 08, 2015 Chart reviewed for concurrent status and case management needs. Will continue to follow the patient for changes and needs: transferred to [facility] floor from sdu/Lajuanna Pompa [place], BSN, [place], [place]   [phone]

## 2015-09-08 NOTE — Progress Notes (Signed)
PULMONARY / CRITICAL CARE MEDICINE   Name: Cory Salazar MRN: [id number] DOB: [birth year]    ADMISSION DATE:  09/05/2015 CONSULTATION DATE:  09/05/15  REFERRING MD: Amado Coe, A  CHIEF COMPLAINT:  Acute Renal Failure  Brief: 55 y/o male admitted to [facility] on 7/24 with fever, possible pneumonia and AKI.  Baseline "encephalopathy" post treatment for brain cancer (remote).  He was treated with antibiotics but due to ongoing renal failure.  Found to have obstructing stone s/p perc nephrostomy.  PCCM was consulted for possible CVVHD. He was transferred to [facility] due to an infrastructure emergency at [facility].    SUBJECTIVE:  Called emergently to bedside for worsening respiratory status.  Gurgling respirations, desaturation  VITAL SIGNS: BP (!) 121/93 (BP Location: Left Arm)   Pulse (!) 105   Temp 99.2 F (37.3 C) (Axillary)   Resp 20   Ht 6\' 1"  (1.854 m)   Wt 143 lb 4.8 oz (65 kg)   SpO2 97%   BMI 18.91 kg/m   HEMODYNAMICS:    VENTILATOR SETTINGS:    INTAKE / OUTPUT: I/O last 3 completed shifts: In: 3367.3 [I.V.:2674.3; Other:65; IV Piggyback:628] Out: 5415 [Urine:5415]  PHYSICAL EXAMINATION: General:  Chronically ill appearing adult male HENT: NCAT, dry MM, OP clear PULM: increased respiratory effort, gurgling, rhonchi bilaterally CV: RRR, no mgr GI: BS+, soft, nontender MSK: muscle wasting, space boots in place, SCD's Neuro: eyes open, doesn't follow commands, no gag with deep suctioning  LABS:  BMET  Recent Labs Lab 09/06/15 0337 09/07/15 0607 09/08/15 0839  NA 146* 147* 151*  K 4.3 4.1 3.2*  CL 119* 121* 125*  CO2 13* 14* 14*  BUN 127* 119* 104*  CREATININE 8.00* 6.47* 4.86*  GLUCOSE 291* 263* 267*    Electrolytes  Recent Labs Lab 09/05/15 1006 09/05/15 1317 09/05/15 1741 09/06/15 0337 09/07/15 0607 09/08/15 0839  CALCIUM 8.9 8.4* 8.6* 8.6* 8.6* 8.5*  MG  --   --  3.0*  --   --   --   PHOS 5.6* 5.4* 6.0*  --   --   --     CBC  Recent Labs Lab  09/06/15 0337 09/07/15 0607 09/08/15 0839  WBC 14.5* 11.9* 16.6*  HGB 10.9* 11.2* 10.8*  HCT 34.3* 34.2* 32.9*  PLT 84* 72* 78*    Coag's  Recent Labs Lab 09/06/15 0337  INR 1.48    Sepsis Markers  Recent Labs Lab 09/04/15 1105 09/04/15 1418 09/05/15 0059  LATICACIDVEN 1.9 2.9* 1.1    ABG  Recent Labs Lab 09/04/15 1102  PHART 7.30*  PCO2ART 35  PO2ART 65*    Liver Enzymes  Recent Labs Lab 09/04/15 1102 09/05/15 0059 09/05/15 1006 09/05/15 1317 09/06/15 0337  AST 30 24  --   --  11*  ALT 20 17  --   --  15*  ALKPHOS 175* 120  --   --  83  BILITOT 3.1* 3.5*  --   --  1.8*  ALBUMIN 2.8* 2.2* 2.0* 2.0* 2.3*    Cardiac Enzymes No results for input(s): TROPONINI, PROBNP in the last 168 hours.  Glucose  Recent Labs Lab 09/07/15 0734 09/07/15 1254 09/07/15 1730 09/07/15 2138 09/08/15 0744 09/08/15 1150  GLUCAP 260* 211* 225* 239* 255* 311*    Imaging No results found.   STUDIES:  7/23 renal ultrasound>>. Mild hydronephrosis on the left 7/24 CT abdomen/pelvis >> small superior uretal stone in with mild pelviectasis, additional non-obstructing stone on R, rectal wall thickening, bibasilar atelectasis  CULTURES:  7/23 blood cultures >> 7/23 urine >> 7/24 C. Difficile >> negative  ANTIBIOTICS: 7/23 Zosyn >> 7/26 7/23 vancomycin >> 7/24  7/26 Unasyn >>   SIGNIFICANT EVENTS: 7/23 patient admitted to the ICU with right-sided pneumonia and renal failure requiring CRRT 7/25 percutaneous nephrostomy tube placed 7/26 transferred to floor 7/27 PCCM called emergently to bedside for increased respiratory effort, desaturations  LINES/TUBES: 7/24 left femoral >> 7/26  DISCUSSION: 55 year old with diabetes mellitus, encephalopathy, brain tumor status post resection, major depressive disorder and seizures, presenting with right-sided pneumonia and renal failure.  Also found to have a UTI / obstructing stone with hydronephrosis  s/p percutaneous  nephrostomy.   PCCM called back 7/27 for respiratory distress.    ASSESSMENT / PLAN:  PULMONARY A: Acute Hypoxic Respiratory Failure - acute decompensation 7/27, required intubation  HCAP - concern for aspiration pneumonia, R basilar airspace disease P:   Transfer to ICU Now intubation per sister (HCPOA / Darel Hong) PRVC 8 cc/kg  Wean PEEP / FiO2 for sats >92% ABG one hour post intubation  Intermittent CXR Aspiration precautions  CARDIOVASCULAR A:  No active issues P:  Continuous telemetry Keep MAP > 65  RENAL A:   Acute renal failure - improving slowly with IVF Left-sided hydronephrosis s/p percutaneous nephrostomy Hypokalemia BPH P:   Renal previously following, signed off 7/26 Rec's for continued gentle IVF's D5 1/2NS at 65ml/hr Not a dialysis candidate per Nephrology IR following for perc nephrostomy care Trend BMP / UOP Replace electrolytes as indicated  Continue flomax  GI:  A: At Risk Aspiration  Dysphagia  Protein Calorie Malnutrition  P: NPO Insert OGT Begin TF Pt has living will that states no PEG tube  ID:  A: Aspiration PNA  Pressure Ulcers P: Continue antibiotics as above  Trend PCT   ENDOCRINE A: DM P: Lantus 5 units QD Q4 SSI with moderate scale  NEUROLOGIC: A: Hx Chronic Dementia s/p Tumor Resection > 20 years ago Hx Seizures P: Supportive care Continue lamictal, keppra PRN ativan for seizure  PRN fentanyl / versed for sedation   Family - Sister updated per Dr. Kendrick Fries on patients status.  Discussed mechanical ventilation with family.  She reports whe last saw him in June 2017.  At that time, he was up walking around / able to communicate with sisters.  Sister requests he be put on life support.  Explained that mechanical ventilation will not likely improve his current state of health.    Canary Brim, NP-C [place] [facility] Pgr: [phone] or if no answer [phone] 09/08/2015, 12:06 PM

## 2015-09-08 NOTE — Progress Notes (Signed)
eLink Physician-Brief Progress Note Patient Name: Cory Salazar DOB: 1960/06/04 MRN: [id number]   Date of Service  09/08/2015  HPI/Events of Note  Low BP and Low CVP.   eICU Interventions  Will give NS 1 liter fluid bolus.      Intervention Category Major Interventions: Other:  [name] 09/08/2015, 4:59 PM

## 2015-09-08 NOTE — Procedures (Signed)
Intubation Procedure Note Cory Salazar [id number] 28-Oct-1960  Procedure: Intubation Indications: Airway protection and maintenance  Procedure Details Consent: Risks of procedure as well as the alternatives and risks of each were explained to the (patient/caregiver).  Consent for procedure obtained. Time Out: Verified patient identification, verified procedure, site/side was marked, verified correct patient position, special equipment/implants available, medications/allergies/relevent history reviewed, required imaging and test results available.  Performed  Drugs Etomidate 20mg , Fentanyl , versed 2mg , Rocuronium 60mg  DL x 1 with GS 3 blade Grade 1 view 7.5 ET tube passed through cords under direct visualization Placement confirmed with bilateral breath sounds, positive EtCO2 change and smoke in tube   Evaluation Hemodynamic Status: BP stable throughout; O2 sats: stable throughout Patient's Current Condition: stable Complications: No apparent complications Patient did tolerate procedure well. Chest X-ray ordered to verify placement.  CXR: pending.   [name] 09/08/2015

## 2015-09-09 ENCOUNTER — Inpatient Hospital Stay (HOSPITAL_COMMUNITY): Payer: Medicare Other

## 2015-09-09 DIAGNOSIS — J189 Pneumonia, unspecified organism: Secondary | ICD-10-CM

## 2015-09-09 DIAGNOSIS — N179 Acute kidney failure, unspecified: Secondary | ICD-10-CM

## 2015-09-09 DIAGNOSIS — J9601 Acute respiratory failure with hypoxia: Secondary | ICD-10-CM

## 2015-09-09 LAB — BLOOD GAS, ARTERIAL
ACID-BASE DEFICIT: 7.5 mmol/L — AB (ref 0.0–2.0)
Bicarbonate: 16 mEq/L — ABNORMAL LOW (ref 20.0–24.0)
DRAWN BY: 252031
FIO2: 0.4
LHR: 18 {breaths}/min
MECHVT: 640 mL
O2 SAT: 97.3 %
PEEP/CPAP: 5 cmH2O
PH ART: 7.388 (ref 7.350–7.450)
Patient temperature: 98.6
TCO2: 14.9 mmol/L (ref 0–100)
pCO2 arterial: 27.2 mmHg — ABNORMAL LOW (ref 35.0–45.0)
pO2, Arterial: 96.5 mmHg (ref 80.0–100.0)

## 2015-09-09 LAB — GLUCOSE, CAPILLARY
GLUCOSE-CAPILLARY: 203 mg/dL — AB (ref 65–99)
GLUCOSE-CAPILLARY: 191 mg/dL — AB (ref 65–99)
GLUCOSE-CAPILLARY: 248 mg/dL — AB (ref 65–99)
Glucose-Capillary: 164 mg/dL — ABNORMAL HIGH (ref 65–99)
Glucose-Capillary: 258 mg/dL — ABNORMAL HIGH (ref 65–99)
Glucose-Capillary: 259 mg/dL — ABNORMAL HIGH (ref 65–99)

## 2015-09-09 LAB — CBC
HCT: 29.8 % — ABNORMAL LOW (ref 39.0–52.0)
HEMOGLOBIN: 9.7 g/dL — AB (ref 13.0–17.0)
MCH: 25.2 pg — AB (ref 26.0–34.0)
MCHC: 32.6 g/dL (ref 30.0–36.0)
MCV: 77.4 fL — AB (ref 78.0–100.0)
Platelets: 106 10*3/uL — ABNORMAL LOW (ref 150–400)
RBC: 3.85 MIL/uL — AB (ref 4.22–5.81)
RDW: 16.6 % — ABNORMAL HIGH (ref 11.5–15.5)
WBC: 24.2 10*3/uL — AB (ref 4.0–10.5)

## 2015-09-09 LAB — MAGNESIUM
MAGNESIUM: 1.6 mg/dL — AB (ref 1.7–2.4)
Magnesium: 1.8 mg/dL (ref 1.7–2.4)

## 2015-09-09 LAB — PROCALCITONIN: PROCALCITONIN: 19.65 ng/mL

## 2015-09-09 LAB — BASIC METABOLIC PANEL
ANION GAP: 9 (ref 5–15)
BUN: 77 mg/dL — AB (ref 6–20)
CHLORIDE: 129 mmol/L — AB (ref 101–111)
CO2: 16 mmol/L — ABNORMAL LOW (ref 22–32)
Calcium: 8 mg/dL — ABNORMAL LOW (ref 8.9–10.3)
Creatinine, Ser: 3.54 mg/dL — ABNORMAL HIGH (ref 0.61–1.24)
GFR calc Af Amer: 21 mL/min — ABNORMAL LOW (ref >60)
GFR, EST NON AFRICAN AMERICAN: 18 mL/min — AB (ref >60)
Glucose, Bld: 217 mg/dL — ABNORMAL HIGH (ref 65–99)
POTASSIUM: 2.3 mmol/L — AB (ref 3.5–5.1)
SODIUM: 154 mmol/L — AB (ref 135–145)

## 2015-09-09 LAB — PHOSPHORUS
PHOSPHORUS: 1.5 mg/dL — AB (ref 2.5–4.6)
Phosphorus: 2.2 mg/dL — ABNORMAL LOW (ref 2.5–4.6)

## 2015-09-09 MED ORDER — FAMOTIDINE IN NACL 20-0.9 MG/50ML-% IV SOLN
20.0000 mg | INTRAVENOUS | Status: DC
  Administered 2015-09-10 – 2015-09-12 (×3): 20 mg via INTRAVENOUS
  Filled 2015-09-09 (×3): qty 50

## 2015-09-09 MED ORDER — SODIUM CHLORIDE 0.9 % IV BOLUS (SEPSIS)
500.0000 mL | Freq: Once | INTRAVENOUS | Status: AC
  Administered 2015-09-09: 500 mL via INTRAVENOUS

## 2015-09-09 MED ORDER — OSMOLITE 1.5 CAL PO LIQD
1000.0000 mL | ORAL | Status: DC
  Administered 2015-09-09 – 2015-09-10 (×2): 1000 mL
  Filled 2015-09-09 (×3): qty 1000

## 2015-09-09 MED ORDER — ALBUTEROL SULFATE (2.5 MG/3ML) 0.083% IN NEBU: 2.5000 mg | INHALATION_SOLUTION | RESPIRATORY_TRACT | Status: DC | PRN

## 2015-09-09 MED ORDER — VITAL HIGH PROTEIN PO LIQD: 1000.0000 mL | ORAL | Status: DC

## 2015-09-09 MED ORDER — PRO-STAT SUGAR FREE PO LIQD
30.0000 mL | Freq: Two times a day (BID) | ORAL | Status: DC
Start: 2015-09-09 — End: 2015-09-09

## 2015-09-09 MED ORDER — POTASSIUM CHLORIDE 20 MEQ/15ML (10%) PO SOLN
40.0000 meq | Freq: Three times a day (TID) | ORAL | Status: AC
  Administered 2015-09-09 (×3): 40 meq via ORAL
  Filled 2015-09-09 (×3): qty 30

## 2015-09-09 NOTE — Progress Notes (Signed)
Date:  September 09, 2015 Chart reviewed for concurrent status and case management needs. Will continue to follow the patient for changes and needs: full vent support Marcelle Smiling, BSN, [place], [place]   [phone]

## 2015-09-09 NOTE — Progress Notes (Signed)
Subjective: Patient developed respiratory failure and is now intubated and on a ventilator. This was the family request. He continues to have excellent output from his nephrostomy tube with drainage of clear urine. Renal function continues to improve.  Objective: Vital signs in last 24 hours: Temp:  [97.4 F (36.3 C)-100.5 F (38.1 C)] 98.1 F (36.7 C) (07/28 0737) Pulse Rate:  [101-144] 112 (07/28 1117) Resp:  [6-27] 24 (07/28 1117) BP: (78-124)/(58-85) 114/82 (07/28 1117) SpO2:  [95 %-99 %] 97 % (07/28 1117) FiO2 (%):  [30 %-100 %] 30 % (07/28 1117) Weight:  [62.2 kg (137 lb 2 oz)] 62.2 kg (137 lb 2 oz) (07/27 1306)  Intake/Output from previous day: 07/27 0701 - 07/28 0700 In: 2258 [I.V.:1353; IV Piggyback:405] Out: 1915 [Urine:1915] Intake/Output this shift: Total I/O In: 455 [I.V.:225; NG/GT:80; IV Piggyback:150] Out: -   Physical Exam:  Constitutional: Vital signs reviewed. WD WN in NAD   Eyes: PERRL, No scleral icterus.   Cardiovascular: RRR Pulmonary/Chest: Normal effort Abdominal: Soft. Non-tender, non-distended, bowel sounds are normal, no masses, organomegaly, or guarding present.  Genitourinary: Foley and nephrostomy tube draining well Extremities: No cyanosis or edema   Lab Results:  Recent Labs  09/07/15 0607 09/08/15 0839 09/09/15 0808  HGB 11.2* 10.8* 9.7*  HCT 34.2* 32.9* 29.8*   BMET  Recent Labs  09/08/15 0839 09/09/15 0808  NA 151* 154*  K 3.2* 2.3*  CL 125* 129*  CO2 14* 16*  GLUCOSE 267* 217*  BUN 104* 77*  CREATININE 4.86* 3.54*  CALCIUM 8.5* 8.0*   No results for input(s): LABPT, INR in the last 72 hours. No results for input(s): LABURIN in the last 72 hours. Results for orders placed or performed during the hospital encounter of 09/04/15  Blood Culture (routine x 2)     Status: None (Preliminary result)   Collection Time: 09/04/15 11:00 AM  Result Value Ref Range Status   Specimen Description BLOOD RIGHT ARM  Final   Special  Requests BOTTLES DRAWN AEROBIC AND ANAEROBIC 5CC  Final   Culture NO GROWTH 4 DAYS  Final   Report Status PENDING  Incomplete  Blood Culture (routine x 2)     Status: None (Preliminary result)   Collection Time: 09/04/15 11:05 AM  Result Value Ref Range Status   Specimen Description BLOOD LEFT ARM  Final   Special Requests BOTTLES DRAWN AEROBIC AND ANAEROBIC 5CC  Final   Culture NO GROWTH 4 DAYS  Final   Report Status PENDING  Incomplete  Urine culture     Status: None   Collection Time: 09/04/15 11:25 AM  Result Value Ref Range Status   Specimen Description URINE, RANDOM  Final   Special Requests Normal  Final   Culture NO GROWTH Performed at [facility]   Final   Report Status 09/05/2015 FINAL  Final  MRSA PCR Screening     Status: None   Collection Time: 09/04/15  2:19 PM  Result Value Ref Range Status   MRSA by PCR NEGATIVE NEGATIVE Final    Comment:        The GeneXpert MRSA Assay (FDA approved for NASAL specimens only), is one component of a comprehensive MRSA colonization surveillance program. It is not intended to diagnose MRSA infection nor to guide or monitor treatment for MRSA infections.   C difficile quick scan w PCR reflex     Status: None   Collection Time: 09/05/15 10:11 AM  Result Value Ref Range Status   C Diff  antigen NEGATIVE NEGATIVE Final   C Diff toxin NEGATIVE NEGATIVE Final   C Diff interpretation No C. difficile detected.  Final  Gastrointestinal Panel by PCR , Stool     Status: None   Collection Time: 09/05/15 10:11 AM  Result Value Ref Range Status   Campylobacter species NOT DETECTED NOT DETECTED Final   Plesimonas shigelloides NOT DETECTED NOT DETECTED Final   Salmonella species NOT DETECTED NOT DETECTED Final   Yersinia enterocolitica NOT DETECTED NOT DETECTED Final   Vibrio species NOT DETECTED NOT DETECTED Final   Vibrio cholerae NOT DETECTED NOT DETECTED Final   Enteroaggregative E coli (EAEC) NOT DETECTED NOT DETECTED Final    Enteropathogenic E coli (EPEC) NOT DETECTED NOT DETECTED Final   Enterotoxigenic E coli (ETEC) NOT DETECTED NOT DETECTED Final   Shiga like toxin producing E coli (STEC) NOT DETECTED NOT DETECTED Final   E. coli O157 NOT DETECTED NOT DETECTED Final   Shigella/Enteroinvasive E coli (EIEC) NOT DETECTED NOT DETECTED Final   Cryptosporidium NOT DETECTED NOT DETECTED Final   Cyclospora cayetanensis NOT DETECTED NOT DETECTED Final   Entamoeba histolytica NOT DETECTED NOT DETECTED Final   Giardia lamblia NOT DETECTED NOT DETECTED Final   Adenovirus F40/41 NOT DETECTED NOT DETECTED Final   Astrovirus NOT DETECTED NOT DETECTED Final   Norovirus GI/GII NOT DETECTED NOT DETECTED Final   Rotavirus A NOT DETECTED NOT DETECTED Final   Sapovirus (I, II, IV, and V) NOT DETECTED NOT DETECTED Final    Studies/Results: Portable Chest Xray  Result Date: 09/09/2015 CLINICAL DATA:  Intubation. EXAM: PORTABLE CHEST 1 VIEW COMPARISON:  09/08/2015. FINDINGS: Endotracheal tube and NG tube in stable position. Left subclavian line in stable position. Right lower lobe infiltrate consistent pneumonia. Heart size normal. Small right pleural effusion cannot be excluded. No pneumothorax . IMPRESSION: 1. Lines and tubes in stable position. 2. Right lower lobe infiltrate consistent with pneumonia. Slight interim progression from prior exam . Small right pleural effusion cannot be exclude. Electronically Signed   By: Maisie Fus  Register   On: 09/09/2015 06:45  Dg Chest Port 1 View  Result Date: 09/08/2015 CLINICAL DATA:  Endotracheal placement. EXAM: PORTABLE CHEST 1 VIEW COMPARISON:  09/06/2015 FINDINGS: Endotracheal tube has its tip 5 cm above the carina. Nasogastric tube enters the stomach. Left subclavian central line has its tip in the SVC above the right atrium. Heart size is normal. The left lung is clear. There is infiltrate in the right lower lobe. IMPRESSION: Lines and tubes well positioned. Endotracheal tube 5 cm  above the carina. Right lower lobe pneumonia persists. Electronically Signed   By: Paulina Fusi M.D.   On: 09/08/2015 13:34   Assessment/Plan:   Urosepsis secondary to left ureteral stone. The patient is now status post percutaneous nephrostomy tube and has source control. Nothing else to do urologically. The patient will eventually need definitive stone procedure if he does make it through this acute hospitalization.   LOS: 4 days   Keegan Bensch S 09/09/2015, 12:37 PM

## 2015-09-09 NOTE — Progress Notes (Signed)
PULMONARY / CRITICAL CARE MEDICINE   Name: Cory Salazar MRN: [id number] DOB: [birth year]    ADMISSION DATE:  09/05/2015 CONSULTATION DATE:  09/05/15  REFERRING MD: Amado Coe, A  CHIEF COMPLAINT:  Acute Renal Failure  Brief: 55 y/o male admitted to [facility] on 7/24 with fever, possible pneumonia and AKI.  Baseline "encephalopathy" post treatment for brain cancer (remote).  He was treated with antibiotics but due to ongoing renal failure.  Found to have obstructing stone s/p perc nephrostomy.  PCCM was consulted for possible CVVHD. He was transferred to [facility] due to an infrastructure emergency at [facility].    SUBJECTIVE:  RN reports no acute events.  Pt weaning on PSV 10/5 - Vt ~ 500, given NS bolus overnight for soft BP  VITAL SIGNS: BP 110/78   Pulse (!) 111   Temp 98.1 F (36.7 C) (Oral)   Resp 19   Ht 6\' 1"  (1.854 m)   Wt 137 lb 2 oz (62.2 kg)   SpO2 98%   BMI 18.09 kg/m   HEMODYNAMICS: CVP:  [0 mmHg-4 mmHg] 4 mmHg   VENTILATOR SETTINGS: Vent Mode: PRVC FiO2 (%):  [40 %-100 %] 40 % Set Rate:  [17 bmp-18 bmp] 18 bmp Vt Set:  [640 mL] 640 mL PEEP:  [5 cmH20-8 cmH20] 5 cmH20 Pressure Support:  [15 cmH20] 15 cmH20 Plateau Pressure:  [11 cmH20-27 cmH20] 21 cmH20  INTAKE / OUTPUT: I/O last 3 completed shifts: In: 4105.3 [I.V.:2824.3; Other:510; IV Piggyback:771] Out: 4015 [Urine:4015]  PHYSICAL EXAMINATION: General:  Chronically ill appearing adult male on vent HENT: NCAT, dry MM, OP clear PULM: non-labored, lungs bilaterally coarse, wheezing on R CV: RRR, no mgr GI: BS+, soft, nontender MSK: muscle wasting, space boots in place, SCD's Neuro: eyes open, doesn't follow commands  LABS:  BMET  Recent Labs Lab 09/06/15 0337 09/07/15 0607 09/08/15 0839  NA 146* 147* 151*  K 4.3 4.1 3.2*  CL 119* 121* 125*  CO2 13* 14* 14*  BUN 127* 119* 104*  CREATININE 8.00* 6.47* 4.86*  GLUCOSE 291* 263* 267*    Electrolytes  Recent Labs Lab 09/05/15 1006 09/05/15 1317  09/05/15 1741 09/06/15 0337 09/07/15 0607 09/08/15 0839  CALCIUM 8.9 8.4* 8.6* 8.6* 8.6* 8.5*  MG  --   --  3.0*  --   --   --   PHOS 5.6* 5.4* 6.0*  --   --   --     CBC  Recent Labs Lab 09/07/15 0607 09/08/15 0839 09/09/15 0808  WBC 11.9* 16.6* 24.2*  HGB 11.2* 10.8* 9.7*  HCT 34.2* 32.9* 29.8*  PLT 72* 78* 106*    Coag's  Recent Labs Lab 09/06/15 0337  INR 1.48    Sepsis Markers  Recent Labs Lab 09/04/15 1105 09/04/15 1418 09/05/15 0059  LATICACIDVEN 1.9 2.9* 1.1    ABG  Recent Labs Lab 09/08/15 1230 09/08/15 1300 09/09/15 0630  PHART 7.232* 7.287* 7.388  PCO2ART 40.8 36.1 27.2*  PO2ART 64.9* 347* 96.5    Liver Enzymes  Recent Labs Lab 09/04/15 1102 09/05/15 0059 09/05/15 1006 09/05/15 1317 09/06/15 0337  AST 30 24  --   --  11*  ALT 20 17  --   --  15*  ALKPHOS 175* 120  --   --  83  BILITOT 3.1* 3.5*  --   --  1.8*  ALBUMIN 2.8* 2.2* 2.0* 2.0* 2.3*    Cardiac Enzymes No results for input(s): TROPONINI, PROBNP in the last 168 hours.  Glucose  Recent  Labs Lab 09/08/15 1340 09/08/15 1605 09/08/15 1919 09/08/15 2303 09/09/15 0412 09/09/15 0730  GLUCAP 231* 202* 146* 164* 203* 191*    Imaging Portable Chest Xray  Result Date: 09/09/2015 CLINICAL DATA:  Intubation. EXAM: PORTABLE CHEST 1 VIEW COMPARISON:  09/08/2015. FINDINGS: Endotracheal tube and NG tube in stable position. Left subclavian line in stable position. Right lower lobe infiltrate consistent pneumonia. Heart size normal. Small right pleural effusion cannot be excluded. No pneumothorax . IMPRESSION: 1. Lines and tubes in stable position. 2. Right lower lobe infiltrate consistent with pneumonia. Slight interim progression from prior exam . Small right pleural effusion cannot be exclude. Electronically Signed   By: Maisie Fus  Register   On: 09/09/2015 06:45  Dg Chest Port 1 View  Result Date: 09/08/2015 CLINICAL DATA:  Endotracheal placement. EXAM: PORTABLE CHEST 1 VIEW  COMPARISON:  09/06/2015 FINDINGS: Endotracheal tube has its tip 5 cm above the carina. Nasogastric tube enters the stomach. Left subclavian central line has its tip in the SVC above the right atrium. Heart size is normal. The left lung is clear. There is infiltrate in the right lower lobe. IMPRESSION: Lines and tubes well positioned. Endotracheal tube 5 cm above the carina. Right lower lobe pneumonia persists. Electronically Signed   By: Paulina Fusi M.D.   On: 09/08/2015 13:34    STUDIES:  7/23 renal ultrasound>>. Mild hydronephrosis on the left 7/24 CT abdomen/pelvis >> small superior uretal stone in with mild pelviectasis, additional non-obstructing stone on R, rectal wall thickening, bibasilar atelectasis  CULTURES: 7/23 blood cultures >> 7/23 urine >> 7/24 C. Difficile >> negative  ANTIBIOTICS: 7/23 Zosyn >> 7/26 7/23 vancomycin >> 7/24  7/26 Unasyn >>   SIGNIFICANT EVENTS: 7/23 patient admitted to the ICU with right-sided pneumonia and renal failure requiring CRRT 7/25 percutaneous nephrostomy tube placed 7/26 transferred to floor 7/27 PCCM called emergently to bedside for increased respiratory effort, desaturations  LINES/TUBES: 7/24 left femoral >> 7/26  DISCUSSION: 55 year old with diabetes mellitus, encephalopathy, brain tumor status post resection, major depressive disorder and seizures, presenting with right-sided pneumonia and renal failure.  Also found to have a UTI / obstructing stone with hydronephrosis  s/p percutaneous nephrostomy.   PCCM called back 7/27 for respiratory distress.    ASSESSMENT / PLAN:  PULMONARY A: Acute Hypoxic Respiratory Failure - acute decompensation 7/27, required intubation  HCAP - concern for aspiration pneumonia, R basilar airspace disease P:   PRVC 8 cc/kg  Wean PEEP / FiO2 for sats >92% Intermittent CXR Aspiration precautions See ID PRN albuterol  SBT / WUA as tolerated  CARDIOVASCULAR A:  No active issues P:  Continuous  telemetry Keep MAP > 65  RENAL A:   Acute renal failure - improving slowly with IVF Left-sided hydronephrosis s/p percutaneous nephrostomy Hypokalemia BPH P:   Renal previously following, signed off 7/26 Rec's for continued gentle IVF's D5 1/2NS at 68ml/hr Not a dialysis candidate per Nephrology IR following for perc nephrostomy care Trend BMP / UOP Replace electrolytes as indicated  Continue flomax  GI:  A: At Risk Aspiration  Dysphagia  Protein Calorie Malnutrition  P: NPO OGT / TF Pt has living will that states no PEG tube  ID:  A: Aspiration PNA  Pressure Ulcers P: Continue antibiotics as above  Trend PCT   ENDOCRINE A: DM P: Lantus 5 units QD Q4 SSI with moderate scale  NEUROLOGIC: A: Hx Chronic Dementia s/p Tumor Resection > 20 years ago Hx Seizures P: Supportive care Continue lamictal, keppra PRN ativan  for seizure  PRN fentanyl / versed for sedation   Family - 7/27 Sister updated per Dr. Kendrick Fries on patients status.  Discussed mechanical ventilation with family.  She reports whe last saw him in June 2017.  At that time, he was up walking around / able to communicate with sisters.  Sister requests he be put on life support.  Explained that mechanical ventilation will not likely improve his current state of health.  No family available am 7/28.    [employer] 7/28 and discussed with RN regarding Mr. Faller baseline.  Per RN, the patient can sit in a wheelchair but it 2 person assist to get up.  Not seen walking.  Patient is confused at baseline, speech is clear at baseline but not oriented.     Canary Brim, NP-C [place] [facility] Pgr: [phone] or if no answer [phone] 09/09/2015, 9:35 AM

## 2015-09-09 NOTE — Progress Notes (Signed)
Nutrition Follow-up  DOCUMENTATION CODES:   Non-severe (moderate) malnutrition in context of chronic illness  INTERVENTION:  - Free water flush per MD/NP given hypernatremia, hyperchloridemia, and current IVF.  - Monitor magnesium, potassium, and phosphorus daily for at least 3 days, MD to replete as needed, as pt is at risk for refeeding syndrome. - RD will follow-up 7/30.  Will order Osmolite 1.5 @ 15 mL/hr to increase by 10 mL every 6 hours to reach goal rate of Osmolite 1.5 @ 55 mL/hr. At goal rate, Osmolite 1.5 will provide 1980 kcal, 83 grams of protein, and 1006 mL free water.    NUTRITION DIAGNOSIS:   Inadequate oral intake related to inability to eat as evidenced by NPO status. -ongoing  GOAL:   Patient will meet greater than or equal to 90% of their needs -unable to meet.   MONITOR:   Vent status, Weight trends, Labs, Skin, I & O's  ASSESSMENT:   55 y.o. 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 In ER, pt was tachycardic and febrile. WBC=17k. CXR shows right-sided pneumonia. Pt was transitioned to [facility] for further evaluation and recommendations given limitations of care at [facility]. Urology is aware and critical care consulted. Patient will be admitted to step down for further care. Unable to obtain history from patient.  7/28 Pt continues with OGT with no order for TF since intubation. Estimated nutrition needs updated this AM based on Tmax and Ve. Spoke with Dr. Kendrick Fries about starting TF; order outlined above. RD will follow-up 7/30. Noted that pt's living will states he does not want PEG. Pt will meet nutrition needs once TF reaches goal rate. Pt may be at risk for refeeding given unknown PO intakes PTA and NPO x4 days.  Patient is currently intubated on ventilator support MV: 12 L/min Temp (24hrs), Avg:98.4 F (36.9 C), Min:97.4 F (36.3 C), Max:100.5 F (38.1  C) Propofol: none   Medications reviewed; sliding scale Novolog, 5 units of Lantis/day, 300 mg IV Dilantin/day, 40 mEq oral KCl TID. Labs reviewed; CBGs: 191 and 203 mg/dL, Na: 071 mmol/L, Cl: 219 mmol/L, BUN: 77 mg/dL, creatinine: 7.58 mg/dL, Ca: 8 mg/dL, GFR: 18 mL/min, K: 2.3 mmol/L. IVF: D5-1/2 NS @ 75 mL/hr (306 kcal).     7/27 - Pt continues to be NPO, non-verbal, no family at bedside. - HD cath was placed with plans to begin CRRT.  - Per Nephrology note, pt's creatinine is improving.  - Initially pt was non-verbal with SLP and refused to open his jaw, they recommended consideration of non-oral feeding.  - Spoke with SLP today, stated she was able to suction and perform oral care today, pt has no gag reflex and lacks airway protection.  - Pt transferred from the floor to ICU and was subsequently intubated ~1315 d/t respiratory failure.  - Notes indicate that pt does not desire PEG. OGT in place at this time.  - Unable to determine PO intakes PTA and pt has been NPO since admission.  - He may be at risk for refeeding.   TF recommendations via OGT: Jevity 1.2 @ goal rate of 65 mL/hr with 30 mL free water every 4 hours. Initiate Jevity 1.2 @ 25 mL/hr and increase by 10 mL every 4 hours to reach goal rate of 65 mL/hr. At goal rate + flush, regimen will provide 1872 kcal, 86 grams of protein, and 1439 mL free water.  7/25 - He was previously on Renal/Carb Modified diet with no PO intakes documented; pt was changed to NPO status when he was unable to safely take PO medications. - No family/visitors present at time of RD visit.  - Pt was nonverbal during RD visit and information was unable to be obtained at this time.  - Notes indicate pt with dementia and hx of brain tumor s/p resection.  - HD catheter was placed 7/24 with plan to initiate CRRT.  - Estimated nutrition needs are based on this POC and adjusted body weight used as pt's CBW is <95% IBW.  - Physical assessment shows  no edema or fat wasting, moderate muscle wasting is present to upper and lower body.  - Per chart review, pt has lost 52 lbs (27% body weight) in the past 2 months which is significant for time frame.   IVF: D5 @ 75 mL/hr (306 kcal).   Diet Order:  Diet NPO time specified  Skin:  Wound (see comment) (Stage 1 heel pressure injury, DTIs to bilateral buttocks)  Last BM:  7/24  Height:   Ht Readings from Last 1 Encounters:  09/08/15  (1.854 m)    Weight:   Wt Readings from Last 1 Encounters:  09/08/15 137 lb 2 oz (62.2 kg)    Ideal Body Weight:  83.64 kg (kg)  BMI:  Body mass index is 18.09 kg/m.  Estimated Nutritional Needs:   Kcal:  1986  Protein:  75-93 grams (1.2-1.5 grams/kg)  Fluid:  2 L/day  EDUCATION NEEDS:   No education needs identified at this time    Trenton Gammon, MS, RD, LDN Inpatient Clinical Dietitian Pager # [phone] After hours/weekend pager # [phone]

## 2015-09-09 NOTE — Progress Notes (Signed)
CRITICAL VALUE ALERT  Critical value received:  Potassium 2.3  Date of notification:  09/09/2015  Time of notification:  1008  Critical value read back:Yes.    Nurse who received alert:  Drue Stager, RN  MD notified (1st page):  Canary Brim, NP at bedside and responded. Orders to be placed.  Time of first page:  1010  MD notified (2nd page):  Time of second page:

## 2015-09-09 NOTE — Progress Notes (Signed)
NG/OG tube is leaking from the blue insertion side port. Unable to fix. Will inset new OG tube and start tube feed once chest Xray is complete. Pt VSS. Will continue to follow up/

## 2015-09-09 NOTE — Progress Notes (Signed)
Inpatient Diabetes Program Recommendations  AACE/ADA: New Consensus Statement on Inpatient Glycemic Control (2015)  Target Ranges:  Prepandial:   less than 140 mg/dL      Peak postprandial:   less than 180 mg/dL (1-2 hours)      Critically ill patients:  140 - 180 mg/dL   Results for AREON, SCHWEISS (MRN [id number]) as of 09/09/2015 08:31  Ref. Range 09/08/2015 07:44 09/08/2015 11:50 09/08/2015 13:40 09/08/2015 16:05 09/08/2015 19:19  Glucose-Capillary Latest Ref Range: 65 - 99 mg/dL 818 (H) 403 (H) 754 (H) 202 (H) 146 (H)   Results for LENDELL, TERREBONNE (MRN [id number]) as of 09/09/2015 08:31  Ref. Range 09/08/2015 23:03 09/09/2015 04:12 09/09/2015 07:30  Glucose-Capillary Latest Ref Range: 65 - 99 mg/dL 035 (H) 248 (H) 185 (H)    Admit with: ARF/ Sepsis/ Pneumonia  History: DM, Brain Tumor  Home DM Meds: Lantus 20 units QHS  Current Insulin Orders: Novolog Moderate Correction Scale/ SSI (0-15 units) Q4 hours Lantus 5 units QHS    -Note patient Intubated yesterday around 12pm.  -May start tube feeds today per RD notes.  -Glucose levels consistently >180 mg/dl    MD- Please consider the following in-hospital insulin adjustments:  Start ICU Glycemic Control Protocol  OR  Increase Lantus to 10 units QHS (50% home dose)  Start Novolog tube feed coverage once tube feeds initiated: Novolog 3 units Q4 hours (hold if tube feeds held for any reason)      --Will follow patient during hospitalization--  Ambrose Finland RN, MSN, CDE Diabetes Coordinator Inpatient Glycemic Control Team Team Pager: [phone] (8a-5p)

## 2015-09-09 NOTE — Progress Notes (Signed)
[facility]  I discussed the situation with Darel Hong, the patient's sister and HCPOA who is now at the bedside at length.  I advised that he had made some improvement overnight but that his mental status had not still improved significantly.    I advised that escalation of care (hemodialysis, vasopressors, tracheostomy or CPR) would not help his overall quality of life and would cause more harm than good.  She wishes for him to be DNR, but would like continued medical support to see if he can be successfully extubated in the next 3-4 days.    Heber Charmwood, MD [facility] Pager: [phone] Cell: [phone] After 3pm or if no response, call [phone]

## 2015-09-09 NOTE — Progress Notes (Signed)
Patient ID: Cory Salazar, male   DOB: Jan 12, 1961, 55 y.o.   MRN: [id number]    Referring Physician(s): Grapey,D  Supervising Physician: Irish Lack  Patient Status:  Inpatient  Chief Complaint:  Left hydronephrosis/stone/urosepsis  Subjective:  Pt now intubated secondary to decline in resp status 7/27  Allergies: Review of patient's allergies indicates no known allergies.  Medications: Prior to Admission medications   Medication Sig Start Date End Date Taking? Authorizing Provider  acetaminophen (TYLENOL) 325 MG tablet Take 650 mg by mouth every 4 (four) hours as needed for mild pain, moderate pain or fever.   Yes Historical Provider, MD  insulin glargine (LANTUS) 100 UNIT/ML injection Inject 0.2 mLs (20 Units total) into the skin at bedtime. 09/05/15  Yes Ramonita Lab, MD  ipratropium-albuterol (DUONEB) 0.5-2.5 (3) MG/3ML SOLN Take 3 mLs by nebulization 4 (four) times daily. 09/05/15  Yes Ramonita Lab, MD  lactulose (CHRONULAC) 10 GM/15ML solution Take 30 mLs (20 g total) by mouth 2 (two) times daily. 09/05/15  Yes Ramonita Lab, MD  lamoTRIgine (LAMICTAL) 100 MG tablet Take 1 tablet (100 mg total) by mouth 2 (two) times daily. 09/05/15  Yes Ramonita Lab, MD  levETIRAcetam 500 mg in sodium chloride 0.9 % 100 mL Inject 500 mg into the vein every 12 (twelve) hours. 09/05/15  Yes Ramonita Lab, MD  LORazepam (ATIVAN) 2 MG/ML injection Inject 0.5 mLs (1 mg total) into the vein every 4 (four) hours as needed for seizure. 09/05/15  Yes Ramonita Lab, MD  mirtazapine (REMERON) 15 MG tablet Take 1 tablet (15 mg total) by mouth at bedtime. 09/05/15  Yes Ramonita Lab, MD  phenytoin (DILANTIN) 50 MG tablet Chew 6 tablets (300 mg total) by mouth at bedtime. 07/10/15 07/09/16 Yes Jeanmarie Plant, MD  piperacillin-tazobactam (ZOSYN) 3.375 GM/50ML IVPB Inject 50 mLs (3.375 g total) into the vein every 8 (eight) hours. 09/05/15  Yes Ramonita Lab, MD  potassium chloride SA (K-DUR,KLOR-CON) 20 MEQ tablet Take 20 mEq  by mouth 2 (two) times daily.   Yes Historical Provider, MD  QUEtiapine (SEROQUEL) 200 MG tablet Take 200 mg by mouth 2 (two) times daily. **Medication on hold from 09/01/15 to 09/04/15 per MD at [facility]**   Yes Historical Provider, MD  sertraline (ZOLOFT) 50 MG tablet Take 50 mg by mouth daily.   Yes Historical Provider, MD  sodium chloride 0.45 % solution Inject 100 mLs into the vein continuous. 09/05/15  Yes Ramonita Lab, MD  sodium chloride flush (NS) 0.9 % SOLN Inject 3 mLs into the vein every 12 (twelve) hours. 09/05/15  Yes Ramonita Lab, MD  tamsulosin (FLOMAX) 0.4 MG CAPS capsule Take 0.4 mg by mouth daily.   Yes Historical Provider, MD     Vital Signs: BP 117/81   Pulse (!) 115   Temp 98.9 F (37.2 C) (Oral)   Resp (!) 26   Ht  (1.854 m)   Wt 137 lb 2 oz (62.2 kg)   SpO2 96%   BMI 18.09 kg/m   Physical Exam left PCN intact, dressing dry, output about 1.5 liters yellow urine  Imaging: Ct Abdomen Pelvis Wo Contrast  Result Date: 09/05/2015 CLINICAL DATA:  Left-sided hydronephrosis EXAM: CT ABDOMEN AND PELVIS WITHOUT CONTRAST TECHNIQUE: Multidetector CT imaging of the abdomen and pelvis was performed following the standard protocol without IV contrast. COMPARISON:  Renal ultrasound - 09/04/2015 FINDINGS: The lack of intravenous contrast limits the ability to evaluate solid abdominal organs. Examination is further degraded secondary to patient respiratory  artifact. Lower chest: Limited visualization of the lower thorax demonstrates dependent subpleural ground-glass atelectasis. Airspace opacities are seen within the right lower lobe with associated air bronchograms. Additionally, there are ill-defined nodules seen within the right lower lobe which appear to demonstrate a tree-in-bud pattern. No pleural effusion. Normal heart size. No pericardial effusion. Decreased attenuation of the intra cardiac blood pool suggestive of anemia. Hepatobiliary: Normal hepatic contour. The  gallbladder appears filled with radiopaque material suggestive of sludge. No definite ascites. Pancreas: Normal noncontrast appearance of the pancreas Spleen: Normal noncontrast appearance of the spleen. Incidental common of a small splenule. Adrenals/Urinary Tract: Note is made of a punctate (approximately 0.5 x 0.6 cm) stone within the superior aspects of the left ureter (axial image 55, series 2; coronal image 64, series 602) which results in mild upstream ureterectasis and asymmetric left-sided pelvicaliectasis. No additional evidence of left-sided nephrolithiasis. Solitary punctate (approximately 2 mm) nonobstructing stone within the superior pole of the right kidney (image 40, series 2). No evidence of right-sided urinary obstruction. No renal stones are seen along expected course of the right ureter. The urinary bladder is decompressed with a Foley catheter. Several prominent phleboliths are seen within the left hemipelvis. Stomach/Bowel: A rectal catheter is in place. There is diffuse thickening of the rectal wall, potentially reactive. Moderate colonic stool burden without evidence of enteric obstruction. Normal noncontrast appearance of the terminal ileum. The appendix is not visualized, however there is no asymmetric right-sided very cecal inflammatory change. No pneumoperitoneum, pneumatosis or portal venous gas. Vascular/Lymphatic: Minimal amount of atherosclerotic plaque within a normal caliber abdominal aorta. A central venous catheter tip terminates within the peripheral aspects of the left common iliac vein. No definitive bulky retroperitoneal, mesenteric, pelvic or inguinal lymphadenopathy on this noncontrast examination. Reproductive: No evidence approximately on this noncontrast examination. No free fluid in the pelvic cul-de-sac. Other: Mild diffuse body wall anasarca. Musculoskeletal: No acute or aggressive osseous abnormalities. Stigmata of DISH within the thoracic spine. IMPRESSION: 1.  Punctate (approximately 0.6 cm) stone within the superior aspect of the left ureter results in very mild upstream left-sided ureterectasis and pelvicaliectasis. 2. Additional solitary punctate (approximately 2 mm) nonobstructing right-sided renal stone. No evidence of right-sided urinary obstruction. 3. Bibasilar atelectasis with potential developing airspace opacity within the right lower lobe. Clinical correlation is advised. 4. Diffuse thickening of the rectal wall, potentially reactive secondary to placement of a rectal catheter though an enteritis could have a similar appearance. Clinical correlation is advised. No evidence of enteric obstruction. Electronically Signed   By: Simonne Come M.D.   On: 09/05/2015 21:21  Portable Chest Xray  Result Date: 09/09/2015 CLINICAL DATA:  Intubation. EXAM: PORTABLE CHEST 1 VIEW COMPARISON:  09/08/2015. FINDINGS: Endotracheal tube and NG tube in stable position. Left subclavian line in stable position. Right lower lobe infiltrate consistent pneumonia. Heart size normal. Small right pleural effusion cannot be excluded. No pneumothorax . IMPRESSION: 1. Lines and tubes in stable position. 2. Right lower lobe infiltrate consistent with pneumonia. Slight interim progression from prior exam . Small right pleural effusion cannot be exclude. Electronically Signed   By: Maisie Fus  Register   On: 09/09/2015 06:45  Dg Chest Port 1 View  Result Date: 09/08/2015 CLINICAL DATA:  Endotracheal placement. EXAM: PORTABLE CHEST 1 VIEW COMPARISON:  09/06/2015 FINDINGS: Endotracheal tube has its tip 5 cm above the carina. Nasogastric tube enters the stomach. Left subclavian central line has its tip in the SVC above the right atrium. Heart size is normal. The left lung  is clear. There is infiltrate in the right lower lobe. IMPRESSION: Lines and tubes well positioned. Endotracheal tube 5 cm above the carina. Right lower lobe pneumonia persists. Electronically Signed   By: Paulina Fusi M.D.    On: 09/08/2015 13:34  Dg Chest Port 1 View  Result Date: 09/06/2015 CLINICAL DATA:  Pneumothorax. EXAM: PORTABLE CHEST 1 VIEW COMPARISON:  09/05/2015 . FINDINGS: Mediastinum hilar structures normal. Mild right base subsegmental atelectasis and or infiltrate. Small right pleural effusion. Heart size normal. IMPRESSION: Mild right base subsegmental atelectasis and or infiltrate. Right base pneumonia cannot be excluded. Similar finding noted on prior exam. Chest is otherwise stable. No pneumothorax. Electronically Signed   By: Maisie Fus  Register   On: 09/06/2015 07:15  Dg Chest Port 1 View  Result Date: 09/05/2015 CLINICAL DATA:  Chart states that there have been several attempts to place a "left" side IJ PICC line and there was concern for a possible pneumothorax. Pt has a medium sized bandage on right side of neck, suggesting that IJ PICC was attempted on right side. Ordering MD looked at image outside of patient's room directly after I took it. Hx of diabetes. Pt was not able to verbally respond. EXAM: PORTABLE CHEST 1 VIEW COMPARISON:  09/05/2015 at 12:52 a.m. FINDINGS: There is now no evidence of a left pneumothorax. The previously seen linear opacity near the left apex was likely an artifact. Mild opacity at the right lung base is consistent with atelectasis. Consider pneumonia if there are consistent clinical symptoms. Lungs otherwise clear. No pleural effusion. Heart, mediastinum and hila are unremarkable. IMPRESSION: 1. No evidence of a pneumothorax on the current study. 2. Right lung base opacity most likely atelectasis. Pneumonia is possible. Electronically Signed   By: Amie Portland M.D.   On: 09/05/2015 17:38  Dg Abd Portable 1v  Result Date: 09/09/2015 CLINICAL DATA:  OG tube placement. EXAM: PORTABLE ABDOMEN - 1 VIEW COMPARISON:  None. FINDINGS: OG tube is in place with the tip in the distal stomach. Nephrostomy on the left tube is noted. IMPRESSION: As above. Electronically Signed   By: Drusilla Kanner M.D.   On: 09/09/2015 13:41  Ir Nephrostomy Placement Left  Result Date: 09/06/2015 INDICATION: Left hydronephrosis, urosepsis and left ureteral calculus. EXAM: IR NEPHROSTOMY PLACEMENT LEFT COMPARISON:  CT of the abdomen and pelvis on 09/05/2015 MEDICATIONS: 2 g IV Ancef. As antibiotic prophylaxis, Ancef was ordered pre-procedure and administered intravenously within one hour of incision. ANESTHESIA/SEDATION: Fentanyl 75 mcg IV; Versed 2.0 mg IV Moderate Sedation Time:  13 minutes. The patient was continuously monitored during the procedure by the interventional radiology nurse under my direct supervision. CONTRAST:  10 mL Isovue-300 - administered into the collecting system(s) FLUOROSCOPY TIME:  Fluoroscopy Time: 2 minutes and 54 seconds. COMPLICATIONS: None immediate. PROCEDURE: Informed written consent was obtained from the patient's sister after a thorough discussion of the procedural risks, benefits and alternatives. All questions were addressed. Maximal Sterile Barrier Technique was utilized including caps, mask, sterile gowns, sterile gloves, sterile drape, hand hygiene and skin antiseptic. A timeout was performed prior to the initiation of the procedure. Ultrasound was used to localize the left kidney. Under direct ultrasound guidance, a 21 gauge needle was advanced into the lower pole collecting system. Contrast was injected after aspiration of urine. A guidewire was advanced. A transitional dilator was placed. The tract was dilated over a guidewire. A 10 French nephrostomy tube was advanced. Tube position was confirmed by fluoroscopy. The catheter was connected to a  gravity drainage bag. It was secured at the skin with a Prolene retention suture and StatLock device. FINDINGS: Ultrasound shows mild left-sided hydronephrosis. After nephrostomy tube placement, there was some bloody in urine return related to bleeding in the collecting system with tube advancement. Due to small size of the renal  pelvis, the nephrostomy tube could not be completely formed and was left partially extending into the proximal ureter. IMPRESSION: Left percutaneous nephrostomy tube placement with a 10 French catheter placed and advanced into the proximal ureter. This will be left to gravity bag drainage. Electronically Signed   By: Irish Lack M.D.   On: 09/06/2015 17:04   Labs:  CBC:  Recent Labs  09/06/15 0337 09/07/15 0607 09/08/15 0839 09/09/15 0808  WBC 14.5* 11.9* 16.6* 24.2*  HGB 10.9* 11.2* 10.8* 9.7*  HCT 34.3* 34.2* 32.9* 29.8*  PLT 84* 72* 78* 106*    COAGS:  Recent Labs  09/06/15 0337  INR 1.48    BMP:  Recent Labs  09/06/15 0337 09/07/15 0607 09/08/15 0839 09/09/15 0808  NA 146* 147* 151* 154*  K 4.3 4.1 3.2* 2.3*  CL 119* 121* 125* 129*  CO2 13* 14* 14* 16*  GLUCOSE 291* 263* 267* 217*  BUN 127* 119* 104* 77*  CALCIUM 8.6* 8.6* 8.5* 8.0*  CREATININE 8.00* 6.47* 4.86* 3.54*  GFRNONAA 7* 9* 12* 18*  GFRAA 8* 10* 14* 21*    LIVER FUNCTION TESTS:  Recent Labs  07/10/15 1231 09/04/15 1102 09/05/15 0059 09/05/15 1006 09/05/15 1317 09/06/15 0337  BILITOT 0.4 3.1* 3.5*  --   --  1.8*  AST 27 30 24   --   --  11*  ALT 23 20 17   --   --  15*  ALKPHOS 141* 175* 120  --   --  83  PROT 8.1 8.0 6.2*  --   --  6.0*  ALBUMIN 3.9 2.8* 2.2* 2.0* 2.0* 2.3*    Assessment and Plan: S/p left PCN 7/25(stone/hydro); AF; WBC 24.2(16.6); hgb 9.7(10.8); K 2.3- replace; creat 3.54(4.86); CXR with RLL PNA; plans as per urology/CCM   Electronically Signed: D. Jeananne Rama 09/09/2015, 1:45 PM   I spent a total of 15 minutes at the the patient's bedside AND on the patient's hospital floor or unit, greater than 50% of which was counseling/coordinating care for left perc nephrostomy

## 2015-09-10 ENCOUNTER — Inpatient Hospital Stay (HOSPITAL_COMMUNITY): Payer: Medicare Other

## 2015-09-10 DIAGNOSIS — G40909 Epilepsy, unspecified, not intractable, without status epilepticus: Secondary | ICD-10-CM

## 2015-09-10 DIAGNOSIS — E876 Hypokalemia: Secondary | ICD-10-CM

## 2015-09-10 DIAGNOSIS — E870 Hyperosmolality and hypernatremia: Secondary | ICD-10-CM

## 2015-09-10 LAB — CBC
HEMATOCRIT: 27.9 % — AB (ref 39.0–52.0)
HEMOGLOBIN: 9 g/dL — AB (ref 13.0–17.0)
MCH: 25.1 pg — AB (ref 26.0–34.0)
MCHC: 32.3 g/dL (ref 30.0–36.0)
MCV: 77.7 fL — ABNORMAL LOW (ref 78.0–100.0)
Platelets: 178 10*3/uL (ref 150–400)
RBC: 3.59 MIL/uL — AB (ref 4.22–5.81)
RDW: 17 % — ABNORMAL HIGH (ref 11.5–15.5)
WBC: 23.7 10*3/uL — ABNORMAL HIGH (ref 4.0–10.5)

## 2015-09-10 LAB — GLUCOSE, CAPILLARY
GLUCOSE-CAPILLARY: 320 mg/dL — AB (ref 65–99)
GLUCOSE-CAPILLARY: 320 mg/dL — AB (ref 65–99)
GLUCOSE-CAPILLARY: 380 mg/dL — AB (ref 65–99)
GLUCOSE-CAPILLARY: 393 mg/dL — AB (ref 65–99)
Glucose-Capillary: 346 mg/dL — ABNORMAL HIGH (ref 65–99)
Glucose-Capillary: 406 mg/dL — ABNORMAL HIGH (ref 65–99)

## 2015-09-10 LAB — MAGNESIUM
MAGNESIUM: 1.6 mg/dL — AB (ref 1.7–2.4)
MAGNESIUM: 2.1 mg/dL (ref 1.7–2.4)

## 2015-09-10 LAB — BASIC METABOLIC PANEL
BUN: 58 mg/dL — ABNORMAL HIGH (ref 6–20)
CALCIUM: 7.8 mg/dL — AB (ref 8.9–10.3)
CO2: 16 mmol/L — AB (ref 22–32)
Chloride: >130 mmol/L (ref 101–111)
Creatinine, Ser: 2.98 mg/dL — ABNORMAL HIGH (ref 0.61–1.24)
GFR, EST AFRICAN AMERICAN: 26 mL/min — AB (ref >60)
GFR, EST NON AFRICAN AMERICAN: 22 mL/min — AB (ref >60)
GLUCOSE: 346 mg/dL — AB (ref 65–99)
POTASSIUM: 3.2 mmol/L — AB (ref 3.5–5.1)
Sodium: 157 mmol/L — ABNORMAL HIGH (ref 135–145)

## 2015-09-10 LAB — PROCALCITONIN: Procalcitonin: 13.84 ng/mL

## 2015-09-10 LAB — PHOSPHORUS
PHOSPHORUS: 1.3 mg/dL — AB (ref 2.5–4.6)
Phosphorus: 2.9 mg/dL (ref 2.5–4.6)

## 2015-09-10 MED ORDER — FENTANYL CITRATE (PF) 100 MCG/2ML IJ SOLN
25.0000 ug | INTRAMUSCULAR | Status: DC | PRN
  Administered 2015-09-10 (×2): 100 ug via INTRAVENOUS
  Administered 2015-09-10: 50 ug via INTRAVENOUS
  Administered 2015-09-10 – 2015-09-19 (×21): 100 ug via INTRAVENOUS
  Filled 2015-09-10 (×27): qty 2

## 2015-09-10 MED ORDER — INSULIN GLARGINE 100 UNIT/ML ~~LOC~~ SOLN
30.0000 [IU] | SUBCUTANEOUS | Status: DC
  Administered 2015-09-10: 30 [IU] via SUBCUTANEOUS
  Filled 2015-09-10: qty 0.3

## 2015-09-10 MED ORDER — FREE WATER
300.0000 mL | Freq: Three times a day (TID) | Status: DC
  Administered 2015-09-10 – 2015-09-11 (×4): 300 mL

## 2015-09-10 MED ORDER — BETHANECHOL CHLORIDE 5 MG PO TABS
5.0000 mg | ORAL_TABLET | Freq: Three times a day (TID) | ORAL | Status: DC
  Administered 2015-09-10 – 2015-09-18 (×26): 5 mg
  Filled 2015-09-10 (×31): qty 1

## 2015-09-10 MED ORDER — DEXTROSE 5 % IV SOLN
INTRAVENOUS | Status: DC
  Administered 2015-09-10 – 2015-09-11 (×3): via INTRAVENOUS
  Administered 2015-09-12: 50 mL via INTRAVENOUS

## 2015-09-10 MED ORDER — MAGNESIUM SULFATE 2 GM/50ML IV SOLN
2.0000 g | Freq: Once | INTRAVENOUS | Status: AC
  Administered 2015-09-10: 2 g via INTRAVENOUS
  Filled 2015-09-10: qty 50

## 2015-09-10 MED ORDER — POTASSIUM PHOSPHATES 15 MMOLE/5ML IV SOLN
30.0000 mmol | Freq: Once | INTRAVENOUS | Status: AC
  Administered 2015-09-10: 30 mmol via INTRAVENOUS
  Filled 2015-09-10: qty 10

## 2015-09-10 MED ORDER — INSULIN ASPART 100 UNIT/ML ~~LOC~~ SOLN: 10.0000 [IU] | Freq: Once | SUBCUTANEOUS | Status: DC

## 2015-09-10 MED ORDER — POTASSIUM CHLORIDE 20 MEQ/15ML (10%) PO SOLN
40.0000 meq | ORAL | Status: AC
  Administered 2015-09-10 (×2): 40 meq
  Filled 2015-09-10 (×2): qty 30

## 2015-09-10 NOTE — Progress Notes (Signed)
eLink Physician-Brief Progress Note Patient Name: Cory Salazar DOB: [birth year] MRN: [id number]   Date of Service  09/10/2015  HPI/Events of Note  Hypokalemia, hypophosphatemia, hypomag, hypernatremia and hyperchloremia.  eICU Interventions  Potassium, phos and mag replaced Free water added.     Intervention Category Intermediate Interventions: Electrolyte abnormality - evaluation and management  DETERDING,ELIZABETH 09/10/2015, 5:33 AM

## 2015-09-10 NOTE — Progress Notes (Signed)
CRITICAL VALUE ALERT  Critical value received:  Chloride > 130  Date of notification:  09/10/2015  Time of notification:  0530  Critical value read back:Yes.    Nurse who received alert:  Loletta Parish RN   MD notified (1st page):  Dr. Darrick Penna   Time of first page:  0530  MD notified (2nd page):  Time of second page:  Responding MD:  Dr. Darrick Penna  Time MD responded:  [phone]

## 2015-09-10 NOTE — Progress Notes (Signed)
PULMONARY / CRITICAL CARE MEDICINE   Name: Cory Salazar MRN: [id number] DOB: 01/13/1961    ADMISSION DATE:  09/05/2015 CONSULTATION DATE:  09/05/15  REFERRING MD: Amado Coe, A  CHIEF COMPLAINT:  Acute Renal Failure  Brief: 55 y/o male admitted to [facility] on 7/24 with fever, possible pneumonia and AKI.  Baseline "encephalopathy" post treatment for brain cancer (remote).  He was treated with antibiotics but due to ongoing renal failure.  Found to have obstructing stone s/p perc nephrostomy.  PCCM was consulted for possible CVVHD. He was transferred to [facility] due to an infrastructure emergency at [facility].    SUBJECTIVE:  No acute events overnight. Electrolyte derangements were corrected this morning. Patient had low-grade fever this morning and was noted to have tachycardia. Tachycardia persists even after low-grade temperature resolved.  REVIEW OF SYSTEMS:  Unable to obtain w/ dementia & intubation.  VITAL SIGNS: BP 113/71   Pulse (!) 124   Temp 99 F (37.2 C) (Oral)   Resp (!) 29   Ht 6\' 1"  (1.854 m)   Wt 141 lb 1.5 oz (64 kg)   SpO2 100%   BMI 18.62 kg/m   HEMODYNAMICS: CVP:  [0 mmHg-7 mmHg] 7 mmHg   VENTILATOR SETTINGS: Vent Mode: CPAP;PSV FiO2 (%):  [30 %-40 %] 40 % Set Rate:  [18 bmp] 18 bmp Vt Set:  [640 mL] 640 mL PEEP:  [5 cmH20] 5 cmH20 Pressure Support:  [10 cmH20] 10 cmH20 Plateau Pressure:  [19 cmH20-22 cmH20] 21 cmH20  INTAKE / OUTPUT: I/O last 3 completed shifts: In: 4371.5 [I.V.:2703; Other:250; NG/GT:92.5; IV Piggyback:1326] Out: 3110 [Urine:3110]  PHYSICAL EXAMINATION: General:  Male. No distress. On ventilator. No family at bedside. HEENT: Endotracheal tube in place. Dilated pupils bilaterally. No scleral icterus or injection. Pulmonary: Mildly increased work of breathing on pressure support trial. Cardiovascular: Tachycardic. Supraventricular tachycardia on telemetry. No edema. Abdomen: Soft. Nondistended. Hypoactive bowel sounds. Musculoskeletal: Symmetrically  decreased muscle bulk. No joint effusion appreciated. Neurological: Not following commands. Eyes open slowly to voice. Sedated.   BMET  Recent Labs Lab 09/08/15 0839 09/09/15 0808 09/10/15 0450  NA 151* 154* 157*  K 3.2* 2.3* 3.2*  CL 125* 129* >130*  CO2 14* 16* 16*  BUN 104* 77* 58*  CREATININE 4.86* 3.54* 2.98*  GLUCOSE 267* 217* 346*    Electrolytes  Recent Labs Lab 09/08/15 0839 09/09/15 0808 09/09/15 1621 09/10/15 0450  CALCIUM 8.5* 8.0*  --  7.8*  MG  --  1.6* 1.8 1.6*  PHOS  --  2.2* 1.5* 1.3*    CBC  Recent Labs Lab 09/08/15 0839 09/09/15 0808 09/10/15 0450  WBC 16.6* 24.2* 23.7*  HGB 10.8* 9.7* 9.0*  HCT 32.9* 29.8* 27.9*  PLT 78* 106* 178    Coag's  Recent Labs Lab 09/06/15 0337  INR 1.48    Sepsis Markers  Recent Labs Lab 09/04/15 1105 09/04/15 1418 09/05/15 0059 09/09/15 0808 09/10/15 0450  LATICACIDVEN 1.9 2.9* 1.1  --   --   PROCALCITON  --   --   --  19.65 13.84    ABG  Recent Labs Lab 09/08/15 1230 09/08/15 1300 09/09/15 0630  PHART 7.232* 7.287* 7.388  PCO2ART 40.8 36.1 27.2*  PO2ART 64.9* 347* 96.5    Liver Enzymes  Recent Labs Lab 09/04/15 1102 09/05/15 0059 09/05/15 1006 09/05/15 1317 09/06/15 0337  AST 30 24  --   --  11*  ALT 20 17  --   --  15*  ALKPHOS 175* 120  --   --  83  BILITOT 3.1* 3.5*  --   --  1.8*  ALBUMIN 2.8* 2.2* 2.0* 2.0* 2.3*    Cardiac Enzymes No results for input(s): TROPONINI, PROBNP in the last 168 hours.  Glucose  Recent Labs Lab 09/09/15 1300 09/09/15 1551 09/09/15 2006 09/10/15 0026 09/10/15 0410 09/10/15 0843  GLUCAP 258* 248* 259* 320* 320* 346*    Imaging Dg Chest Port 1 View  Result Date: 09/10/2015 CLINICAL DATA:  Acute respiratory failure EXAM: PORTABLE CHEST 1 VIEW COMPARISON:  September 09, 2015 FINDINGS: The NG tube terminates below today's film. Support apparatus is otherwise stable. No pneumothorax. No pulmonary nodules or masses. Focal opacity in the  medial right lung base persists. Recommend follow-up to resolution. IMPRESSION: 1. Stable support apparatus. 2. Persistent focal opacity in the medial right lung base. Electronically Signed   By: Gerome Sam III M.D   On: 09/10/2015 07:37  Dg Abd Portable 1v  Result Date: 09/09/2015 CLINICAL DATA:  OG tube placement. EXAM: PORTABLE ABDOMEN - 1 VIEW COMPARISON:  None. FINDINGS: OG tube is in place with the tip in the distal stomach. Nephrostomy on the left tube is noted. IMPRESSION: As above. Electronically Signed   By: Drusilla Kanner M.D.   On: 09/09/2015 13:41    STUDIES:  Renal US 7/23: Mild hydronephrosis on the left CT Abd/Pelvis 7/24: small superior uretal stone in with mild pelviectasis, additional non-obstructing stone on R, rectal wall thickening, bibasilar atelectasis Port CXR 7/29: Left subclavian central venous catheter in place. Endotracheal tube in good position. Enteric feeding tube coursing below diaphragm. Right basal opacity persists.  MICROBIOLOGY: MRSA PCR 7/23:  Negative  Urine Ctx 7/23:  Negative  Blood Ctx x2 7/23>>> Stool C diff 7/24:  Negative   ANTIBIOTICS: Zosyn 7/24 - 7/25 Vancomycin 7/25 Augmentin 7/25 - 7/26 Unasyn 7/25 >>   SIGNIFICANT EVENTS: 7/24 - Admit to ICU w/ R Pneumonia & Acute Renal Failure on CRRT 7/25 - Perc nephrostomy tube placed 7/26 - Transferred to floor 7/27 - Transfer to ICU for acute hypoxic resp failure & increased work of breathing  LINES/TUBES: L Fem CVL 7/24 - 7/26 L Perc Nephrostomy Tube 7/25 >> OETT 7.5 7/26 >> L Subclav CVL 7/27 >> Foley 7/26 >> OGT 7/28 >> PIV x1  ASSESSMENT / PLAN:  PULMONARY A: Acute Hypoxic Respiratory Failure - Decompensated & required reintubation 7/27. HCAP - Probable aspiration R basilar opacity.  P:   Full Vent Support Wean PEEP / FiO2 for sats >92% Intermittent CXR & ABG Aspiration precautions Albuterol Neb PRN SBT / WUA as tolerated See ID  ID:  A: Aspiration  Pneumonia  P: Empiric Unasyn Day #5 Awaiting finalization of cultures Plan to re-culture for fever  CARDIOVASCULAR A:  SVT  P:  Continuing telemetry monitoring  Vitals per unit protocol Goal MAP >65 Stat EKG  RENAL A:   Acute Renal Failure - Slow improvement. Hypernatremia - Worsening. Hypokalemia - Replaced. Hypomagnesemia - Replaced. Hypophosphatemia - Replaced. Hyperchloremia - Worsening Metabolic Acidosis - Likely secondary to hyperchloremia. L Hydronephrosis - S/P Perc Nephrostomy. H/O BPH  P:   Renal previously following, signed off 7/26 Rec's for continued gentle IVF's Not a dialysis candidate per Nephrology IR following for perc nephrostomy care Monitoring UOP with Foley & Nephrostomy tube Trending electrolytes & renal function daily D/C D5 1/2NS at 82ml/hr Start D5W @ 50 mL/hr Free Water 300cc VT q8hr KPhos IV KCl VT x2 doses Magnesium Sulfate 2gm IV Replace electrolytes as indicated  D/C  Flomax Start Bethanechol VT tid  GI:  A: At Risk Aspiration  Dysphagia  Protein Calorie Malnutrition   P: NPO OGT & Tube Feedings Pt has living will that states no PEG tube Speech Eval if extubated Pepcid IV q24hr  ENDOCRINE A: H/O DM - Glucose uncontrolled. Complicated by D5 IVF.  P: Increase Lantus to 30u [place] q24hr @ 1800 Continue Accu-Checks q4hr SSI per Moderate Algorithm  NEUROLOGIC: A: Sedation on Ventilator H/O Chronic Dementia S/P Tumor Resection - 20 years ago H/O Seizures  P: Desired RASS:  0 to -1 Fentanyl IV prn Pain Versed IV prn Sedation Supportive care AED:  Keppra & Lamictal Ativan IV prn Seizure  FAMILY UPDATE: Patient's sister Darel Hong [facility]) updated by phone 7/29 by Dr. Jamison Neighbor.  TODAY'S SUMMARY:  19 year old with diabetes mellitus, encephalopathy, brain tumor status post resection, major depressive disorder and seizures, presenting with right-sided pneumonia and renal failure.  Also found to have a UTI /  obstructing stone with hydronephrosis  s/p percutaneous nephrostomy.   Patient readmitted on 7/27 for acute respiratory failure likely due to aspiration. Continuing Unasyn for now. Checking EKG given supraventricular tachycardia. Prognosis is poor given her multiple medical problems. Continuing supportive care with no escalation per my discussion with his sister Darel Hong today.  I have spent a total of 45 minutes of critical care time today caring for the patient and reviewing the patient's electronic medical record.  Donna Christen Jamison Neighbor, M.D. [facility] Pulmonary & Critical Care Pager:  [phone] After 3pm or if no response, call [phone] 09/10/2015, 1:07 PM

## 2015-09-10 NOTE — Progress Notes (Signed)
SLP Cancellation Note  Patient Details Name: Leonardo Koob MRN: [id number] DOB: 1960/10/27   Cancelled treatment:       Reason Eval/Treat Not Completed: Medical issues which prohibited therapy (pt remains intubated, will sign off; please reorder if indicated)   Donavan Burnet, MS [facility] SLP [phone]

## 2015-09-11 DIAGNOSIS — N132 Hydronephrosis with renal and ureteral calculous obstruction: Secondary | ICD-10-CM

## 2015-09-11 LAB — GLUCOSE, CAPILLARY
GLUCOSE-CAPILLARY: 379 mg/dL — AB (ref 65–99)
GLUCOSE-CAPILLARY: 385 mg/dL — AB (ref 65–99)
GLUCOSE-CAPILLARY: 294 mg/dL — AB (ref 65–99)
Glucose-Capillary: 318 mg/dL — ABNORMAL HIGH (ref 65–99)
Glucose-Capillary: 275 mg/dL — ABNORMAL HIGH (ref 65–99)
Glucose-Capillary: 286 mg/dL — ABNORMAL HIGH (ref 65–99)
Glucose-Capillary: 449 mg/dL — ABNORMAL HIGH (ref 65–99)

## 2015-09-11 LAB — CBC WITH DIFFERENTIAL/PLATELET
Basophils Absolute: 0 10*3/uL (ref 0.0–0.1)
Basophils Relative: 0 %
EOS ABS: 0.2 10*3/uL (ref 0.0–0.7)
Eosinophils Relative: 1 %
HCT: 24.2 % — ABNORMAL LOW (ref 39.0–52.0)
HEMOGLOBIN: 7.8 g/dL — AB (ref 13.0–17.0)
LYMPHS PCT: 7 %
Lymphs Abs: 1.7 10*3/uL (ref 0.7–4.0)
MCH: 25.2 pg — AB (ref 26.0–34.0)
MCHC: 32.2 g/dL (ref 30.0–36.0)
MCV: 78.3 fL (ref 78.0–100.0)
MONO ABS: 1.2 10*3/uL — AB (ref 0.1–1.0)
MONOS PCT: 5 %
NEUTROS ABS: 20.9 10*3/uL — AB (ref 1.7–7.7)
NEUTROS PCT: 87 %
Platelets: 151 10*3/uL (ref 150–400)
RBC: 3.09 MIL/uL — ABNORMAL LOW (ref 4.22–5.81)
RDW: 17.3 % — ABNORMAL HIGH (ref 11.5–15.5)
WBC: 24 10*3/uL — AB (ref 4.0–10.5)

## 2015-09-11 LAB — BASIC METABOLIC PANEL
BUN: 43 mg/dL — ABNORMAL HIGH (ref 6–20)
CALCIUM: 7.7 mg/dL — AB (ref 8.9–10.3)
CO2: 17 mmol/L — ABNORMAL LOW (ref 22–32)
Chloride: >130 mmol/L (ref 101–111)
Creatinine, Ser: 2.56 mg/dL — ABNORMAL HIGH (ref 0.61–1.24)
GFR, EST AFRICAN AMERICAN: 31 mL/min — AB (ref >60)
GFR, EST NON AFRICAN AMERICAN: 27 mL/min — AB (ref >60)
Glucose, Bld: 327 mg/dL — ABNORMAL HIGH (ref 65–99)
POTASSIUM: 3.7 mmol/L (ref 3.5–5.1)
SODIUM: 155 mmol/L — AB (ref 135–145)

## 2015-09-11 LAB — PHOSPHORUS: PHOSPHORUS: 2.6 mg/dL (ref 2.5–4.6)

## 2015-09-11 LAB — PROCALCITONIN: PROCALCITONIN: 7.64 ng/mL

## 2015-09-11 LAB — MAGNESIUM: Magnesium: 1.9 mg/dL (ref 1.7–2.4)

## 2015-09-11 MED ORDER — FREE WATER
400.0000 mL | Status: DC
Start: 2015-09-11 — End: 2015-09-13
  Administered 2015-09-11 – 2015-09-13 (×12): 400 mL

## 2015-09-11 MED ORDER — ACETAMINOPHEN 325 MG PO TABS
650.0000 mg | ORAL_TABLET | Freq: Four times a day (QID) | ORAL | Status: DC | PRN
  Administered 2015-09-11 – 2015-09-22 (×4): 650 mg via ORAL
  Filled 2015-09-11 (×4): qty 2

## 2015-09-11 MED ORDER — INSULIN NPH (HUMAN) (ISOPHANE) 100 UNIT/ML ~~LOC~~ SUSP
20.0000 [IU] | Freq: Three times a day (TID) | SUBCUTANEOUS | Status: DC
  Administered 2015-09-11 – 2015-09-13 (×6): 20 [IU] via SUBCUTANEOUS
  Filled 2015-09-11 (×2): qty 10

## 2015-09-11 MED ORDER — SODIUM CHLORIDE 0.9 % IV SOLN
1.5000 g | Freq: Three times a day (TID) | INTRAVENOUS | Status: DC
  Administered 2015-09-11 – 2015-09-12 (×2): 1.5 g via INTRAVENOUS
  Filled 2015-09-11 (×3): qty 1.5

## 2015-09-11 MED ORDER — PRO-STAT SUGAR FREE PO LIQD
30.0000 mL | Freq: Every day | ORAL | Status: DC
  Administered 2015-09-11 – 2015-09-18 (×8): 30 mL
  Filled 2015-09-11 (×8): qty 30

## 2015-09-11 MED ORDER — OSMOLITE 1.5 CAL PO LIQD
1000.0000 mL | ORAL | Status: DC
  Administered 2015-09-11: 1000 mL
  Filled 2015-09-11 (×2): qty 1000

## 2015-09-11 NOTE — Progress Notes (Signed)
Nutrition Follow-up  DOCUMENTATION CODES:   Non-severe (moderate) malnutrition in context of chronic illness  INTERVENTION:   Continue to monitor magnesium, potassium, and phosphorus daily for at least 3 days, MD to replete as needed, as pt is at risk for refeeding syndrome.  Decrease Osmolite 1.5 to 50 ml/hr +30 ml Prostat daily Continue 300 ml free water every 8 hours per MD/NP This tube feeding regimen +current D5 infusion provides 2104 kcal (103% of needs), 90g protein (90% of needs) and 1814 ml H2O.  RD to continue to monitor  NUTRITION DIAGNOSIS:   Inadequate oral intake related to inability to eat as evidenced by NPO status.  Ongoing.  GOAL:   Patient will meet greater than or equal to 90% of their needs  Meeting.  MONITOR:   Vent status, Weight trends, Labs, Skin, I & O's  ASSESSMENT:   55 y.o. 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 In ER, pt was tachycardic and febrile. WBC=17k. CXR shows right-sided pneumonia. Pt was transitioned to [place] [facility] for further evaluation and recommendations given limitations of care at [place]. Urology is aware and critical care consulted. Patient will be admitted to step down for further care. Unable to obtain history from patient.  Patient receiving Osmolite 1.5 @ goal rate of 55 and 300 ml H2O every 8 hours per MD/NP. Pt's needs have increased, will adjust TF order to better meet needs.   Patient is currently intubated on ventilator support MV: 11.6 L/min Temp (24hrs), Avg:100 F (37.8 C), Min:99 F (37.2 C), Max:100.8 F (38.2 C)  Medications: D5 infusion @ 50 ml/hr -provides 204 kcal Labs reviewed: CBGs: 275-379 Elevated Na Mg/Phos/K WNL  Diet Order:  Diet NPO time specified  Skin:  Wound (see comment) (Stage 1 heel pressure injury, DTIs to bilateral buttocks)  Last BM:  7/24  Height:   Ht Readings from Last 1  Encounters:  09/08/15 6\' 1"  (1.854 m)    Weight:   Wt Readings from Last 1 Encounters:  09/11/15 148 lb 5.9 oz (67.3 kg)    Ideal Body Weight:  83.64 kg (kg)  BMI:  Body mass index is 19.57 kg/m.  Estimated Nutritional Needs:   Kcal:  2039  Protein:  100-110g  Fluid:  2L/day  EDUCATION NEEDS:   No education needs identified at this time  Tilda Franco, MS, RD, LDN Pager: [phone] After Hours Pager: [phone]

## 2015-09-11 NOTE — Progress Notes (Signed)
PULMONARY / CRITICAL CARE MEDICINE   Name: Cory Salazar MRN: [id number] DOB: [birth year]    ADMISSION DATE:  09/05/2015 CONSULTATION DATE:  09/05/15  REFERRING MD: Amado Coe, A  CHIEF COMPLAINT:  Acute Renal Failure  Brief: 55 y/o male admitted to [facility] on 7/24 with fever, possible pneumonia and AKI.  Baseline "encephalopathy" post treatment for brain cancer (remote).  He was treated with antibiotics but due to ongoing renal failure.  Found to have obstructing stone s/p perc nephrostomy.  PCCM was consulted for possible CVVHD. He was transferred to [facility] due to an infrastructure emergency at [facility].   SUBJECTIVE:  No acute events overnight. Some improvement in level of alertness today. Patient is having bowel movements.  REVIEW OF SYSTEMS:  Unable to obtain w/ dementia & intubation.  VITAL SIGNS: BP 112/70   Pulse (!) 121   Temp 100.1 F (37.8 C) (Oral)   Resp 20   Ht 6\' 1"  (1.854 m)   Wt 148 lb 5.9 oz (67.3 kg)   SpO2 100%   BMI 19.57 kg/m   HEMODYNAMICS:     VENTILATOR SETTINGS: Vent Mode: PSV FiO2 (%):  [40 %] 40 % Set Rate:  [18 bmp] 18 bmp Vt Set:  [640 mL] 640 mL PEEP:  [5 cmH20] 5 cmH20 Pressure Support:  [8 cmH20] 8 cmH20 Plateau Pressure:  [22 cmH20-27 cmH20] 26 cmH20  INTAKE / OUTPUT: I/O last 3 completed shifts: In: 5285.5 [I.V.:1867.5; Other:40; NG/GT:2091; IV Piggyback:1287] Out: 3570 [Urine:3570]  PHYSICAL EXAMINATION: General:  Male. No distress. Eyes open. No family at bedside. HEENT: Endotracheal tube in place. Moist mucous membranes. No scleral icterus. Pulmonary: Mildly increased work of breathing on pressure support 0/5. Symmetric chest wall rise. Distant breath sounds bilaterally. Cardiovascular: Regular rhythm. No appreciable JVD. No edema. Abdomen: Soft. Nondistended. Normoactive bowel sounds. Musculoskeletal: Symmetrically decreased muscle bulk. No joint effusion appreciated. Neurological: Not following commands. Eyes more open today. Not following  commands.  BMET  Recent Labs Lab 09/09/15 0808 09/10/15 0450 09/11/15 0425  NA 154* 157* 155*  K 2.3* 3.2* 3.7  CL 129* >130* >130*  CO2 16* 16* 17*  BUN 77* 58* 43*  CREATININE 3.54* 2.98* 2.56*  GLUCOSE 217* 346* 327*    Electrolytes  Recent Labs Lab 09/09/15 0808 09/09/15 1621 09/10/15 0450 09/10/15 1700 09/11/15 0425  CALCIUM 8.0*  --  7.8*  --  7.7*  MG 1.6* 1.8 1.6* 2.1  --   PHOS 2.2* 1.5* 1.3* 2.9  --     CBC  Recent Labs Lab 09/09/15 0808 09/10/15 0450 09/11/15 0425  WBC 24.2* 23.7* 24.0*  HGB 9.7* 9.0* 7.8*  HCT 29.8* 27.9* 24.2*  PLT 106* 178 151    Coag's  Recent Labs Lab 09/06/15 0337  INR 1.48    Sepsis Markers  Recent Labs Lab 09/04/15 1105 09/04/15 1418 09/05/15 0059 09/09/15 0808 09/10/15 0450 09/11/15 0425  LATICACIDVEN 1.9 2.9* 1.1  --   --   --   PROCALCITON  --   --   --  19.65 13.84 7.64    ABG  Recent Labs Lab 09/08/15 1230 09/08/15 1300 09/09/15 0630  PHART 7.232* 7.287* 7.388  PCO2ART 40.8 36.1 27.2*  PO2ART 64.9* 347* 96.5    Liver Enzymes  Recent Labs Lab 09/04/15 1102 09/05/15 0059 09/05/15 1006 09/05/15 1317 09/06/15 0337  AST 30 24  --   --  11*  ALT 20 17  --   --  15*  ALKPHOS 175* 120  --   --  83  BILITOT 3.1* 3.5*  --   --  1.8*  ALBUMIN 2.8* 2.2* 2.0* 2.0* 2.3*    Cardiac Enzymes No results for input(s): TROPONINI, PROBNP in the last 168 hours.  Glucose  Recent Labs Lab 09/10/15 1538 09/10/15 1635 09/10/15 2024 09/10/15 2325 09/11/15 0320 09/11/15 0813  GLUCAP 406* 380* 318* 275* 286* 379*    Imaging No results found.   STUDIES:  Renal US 7/23: Mild hydronephrosis on the left CT Abd/Pelvis 7/24: small superior uretal stone in with mild pelviectasis, additional non-obstructing stone on R, rectal wall thickening, bibasilar atelectasis Port CXR 7/29: Left subclavian central venous catheter in place. Endotracheal tube in good position. Enteric feeding tube coursing  below diaphragm. Right basal opacity persists.  MICROBIOLOGY: MRSA PCR 7/23:  Negative  Urine Ctx 7/23:  Negative  Blood Ctx x2 7/23>>> Stool C diff 7/24:  Negative   ANTIBIOTICS: Zosyn 7/24 - 7/25 Vancomycin 7/25 Augmentin 7/25 - 7/26 Unasyn 7/25 >>   SIGNIFICANT EVENTS: 7/24 - Admit to ICU w/ R Pneumonia & Acute Renal Failure on CRRT 7/25 - Perc nephrostomy tube placed 7/26 - Transferred to floor 7/27 - Transfer to ICU for acute hypoxic resp failure & increased work of breathing  LINES/TUBES: L Fem CVL 7/24 - 7/26 L Perc Nephrostomy Tube 7/25 >> OETT 7.5 7/26 >> L Subclav CVL 7/27 >> Foley 7/26 >> OGT 7/28 >> PIV x1  ASSESSMENT / PLAN:  PULMONARY A: Acute Hypoxic Respiratory Failure - Decompensated & required reintubation 7/27. HCAP - Probable aspiration R basilar opacity.  P:   Full Vent Support Wean PEEP / FiO2 for sats >92% Intermittent CXR & ABG Aspiration precautions Albuterol Neb PRN SBT / WUA as tolerated See ID  ID:  A: Aspiration Pneumonia  P: Empiric Unasyn Day #6 Awaiting finalization of cultures Plan to re-culture for fever  CARDIOVASCULAR A:  SVT  P:  Continuing telemetry monitoring  Vitals per unit protocol Goal MAP >65  RENAL A:   Acute Renal Failure - Slow improvement. Hypernatremia - Mild improvement. Approximately 4.3L free water deficit. Hypokalemia - Resolved. Hypomagnesemia - Replaced. Hypophosphatemia - Replaced. Hyperchloremia - Worsening Metabolic Acidosis - Likely secondary to hyperchloremia. L Hydronephrosis - S/P Perc Nephrostomy. H/O BPH  P:   Renal previously following, signed off 7/26 Rec's for continued gentle IVF's Not a dialysis candidate per Nephrology IR following for perc nephrostomy care Monitoring UOP with Foley & Nephrostomy tube Trending electrolytes & renal function daily D5W @ 50 mL/hr Increase Free Water to 400cc VT q4hr Replace electrolytes as indicated  Bethanechol VT tid Stat  Magnesium & Phosphorus  GI:  A: At Risk Aspiration  Dysphagia  Protein Calorie Malnutrition   P: NPO Continuing Tube Feedings Pt has living will that states no PEG tube Speech Eval if extubated Pepcid IV q24hr  HEMATOLOGY: A: Leukcytosis - Stable. Anemia - No signs of active bleeding.  P: Trending cell counts daily w/ CBC Transfuse for Hgb <7.0 or active bleeding SCDs  ENDOCRINE A: H/O DM - Glucose uncontrolled. Complicated by D5 IVF.  P: Switch to NPH 20u [place] q8hr at noon D/C Lantus Continue Accu-Checks q4hr SSI per Moderate Algorithm  NEUROLOGIC: A: Sedation on Ventilator H/O Chronic Dementia S/P Tumor Resection - 20 years ago H/O Seizures  P: Desired RASS:  0 to -1 Fentanyl IV prn Pain Versed IV prn Sedation Supportive care AED:  Keppra & Lamictal Ativan IV prn Seizure Lactulose bid Zoloft qday  FAMILY UPDATE: Patient's sister Darel Hong [facility]) updated  by phone 7/29 by Dr. Jamison Neighbor.  TODAY'S SUMMARY:  53 year old with diabetes mellitus, encephalopathy, brain tumor status post resection, major depressive disorder and seizures, presenting with right-sided pneumonia and renal failure.  Also found to have a UTI / obstructing stone with hydronephrosis  s/p percutaneous nephrostomy.   Patient readmitted on 7/27 for acute respiratory failure likely due to aspiration. Continuing Unasyn for now. Increasing free water for deficit. Continuing ventilator support with spontaneous breathing trial this morning. Increasing insulin coverage for uncontrolled hyperglycemia.  I have spent a total of 33 minutes of critical care time today caring for the patient and reviewing the patient's electronic medical record.  Donna Christen Jamison Neighbor, M.D. [facility] Pulmonary & Critical Care Pager:  [phone] After 3pm or if no response, call [phone] 09/11/2015, 10:36 AM

## 2015-09-11 NOTE — Progress Notes (Signed)
Patient ID: Cory Salazar, male   DOB: 11-15-1960, 55 y.o.   MRN: [id number]    Referring Physician(s): Grapey,D  Supervising Physician: Richarda Overlie  Patient Status:  Inpatient  Chief Complaint: Left hydronephrosis/stone/urosepsis   Subjective:  Still intubated; no acute changes  Allergies: Review of patient's allergies indicates no known allergies.  Medications: Prior to Admission medications   Medication Sig Start Date End Date Taking? Authorizing Provider  acetaminophen (TYLENOL) 325 MG tablet Take 650 mg by mouth every 4 (four) hours as needed for mild pain, moderate pain or fever.   Yes Historical Provider, MD  insulin glargine (LANTUS) 100 UNIT/ML injection Inject 0.2 mLs (20 Units total) into the skin at bedtime. 09/05/15  Yes Ramonita Lab, MD  ipratropium-albuterol (DUONEB) 0.5-2.5 (3) MG/3ML SOLN Take 3 mLs by nebulization 4 (four) times daily. 09/05/15  Yes Ramonita Lab, MD  lactulose (CHRONULAC) 10 GM/15ML solution Take 30 mLs (20 g total) by mouth 2 (two) times daily. 09/05/15  Yes Ramonita Lab, MD  lamoTRIgine (LAMICTAL) 100 MG tablet Take 1 tablet (100 mg total) by mouth 2 (two) times daily. 09/05/15  Yes Ramonita Lab, MD  levETIRAcetam 500 mg in sodium chloride 0.9 % 100 mL Inject 500 mg into the vein every 12 (twelve) hours. 09/05/15  Yes Ramonita Lab, MD  LORazepam (ATIVAN) 2 MG/ML injection Inject 0.5 mLs (1 mg total) into the vein every 4 (four) hours as needed for seizure. 09/05/15  Yes Ramonita Lab, MD  mirtazapine (REMERON) 15 MG tablet Take 1 tablet (15 mg total) by mouth at bedtime. 09/05/15  Yes Ramonita Lab, MD  phenytoin (DILANTIN) 50 MG tablet Chew 6 tablets (300 mg total) by mouth at bedtime. 07/10/15 07/09/16 Yes Jeanmarie Plant, MD  piperacillin-tazobactam (ZOSYN) 3.375 GM/50ML IVPB Inject 50 mLs (3.375 g total) into the vein every 8 (eight) hours. 09/05/15  Yes Ramonita Lab, MD  potassium chloride SA (K-DUR,KLOR-CON) 20 MEQ tablet Take 20 mEq by mouth 2 (two) times daily.    Yes Historical Provider, MD  QUEtiapine (SEROQUEL) 200 MG tablet Take 200 mg by mouth 2 (two) times daily. **Medication on hold from 09/01/15 to 09/04/15 per MD at [facility]**   Yes Historical Provider, MD  sertraline (ZOLOFT) 50 MG tablet Take 50 mg by mouth daily.   Yes Historical Provider, MD  sodium chloride 0.45 % solution Inject 100 mLs into the vein continuous. 09/05/15  Yes Ramonita Lab, MD  sodium chloride flush (NS) 0.9 % SOLN Inject 3 mLs into the vein every 12 (twelve) hours. 09/05/15  Yes Ramonita Lab, MD  tamsulosin (FLOMAX) 0.4 MG CAPS capsule Take 0.4 mg by mouth daily.   Yes Historical Provider, MD     Vital Signs: BP 112/70   Pulse (!) 121   Temp 100.1 F (37.8 C) (Oral)   Resp 20   Ht 6\' 1"  (1.854 m)   Wt 148 lb 5.9 oz (67.3 kg)   SpO2 100%   BMI 19.57 kg/m   Physical Exam left PCN intact, dressing dry, output 300 cc yellow urine today  Imaging: Dg Chest Port 1 View  Result Date: 09/10/2015 CLINICAL DATA:  Acute respiratory failure EXAM: PORTABLE CHEST 1 VIEW COMPARISON:  September 09, 2015 FINDINGS: The NG tube terminates below today's film. Support apparatus is otherwise stable. No pneumothorax. No pulmonary nodules or masses. Focal opacity in the medial right lung base persists. Recommend follow-up to resolution. IMPRESSION: 1. Stable support apparatus. 2. Persistent focal opacity in the medial right lung base.  Electronically Signed   By: Gerome Sam III M.D   On: 09/10/2015 07:37  Portable Chest Xray  Result Date: 09/09/2015 CLINICAL DATA:  Intubation. EXAM: PORTABLE CHEST 1 VIEW COMPARISON:  09/08/2015. FINDINGS: Endotracheal tube and NG tube in stable position. Left subclavian line in stable position. Right lower lobe infiltrate consistent pneumonia. Heart size normal. Small right pleural effusion cannot be excluded. No pneumothorax . IMPRESSION: 1. Lines and tubes in stable position. 2. Right lower lobe infiltrate consistent with pneumonia. Slight interim  progression from prior exam . Small right pleural effusion cannot be exclude. Electronically Signed   By: Maisie Fus  Register   On: 09/09/2015 06:45  Dg Chest Port 1 View  Result Date: 09/08/2015 CLINICAL DATA:  Endotracheal placement. EXAM: PORTABLE CHEST 1 VIEW COMPARISON:  09/06/2015 FINDINGS: Endotracheal tube has its tip 5 cm above the carina. Nasogastric tube enters the stomach. Left subclavian central line has its tip in the SVC above the right atrium. Heart size is normal. The left lung is clear. There is infiltrate in the right lower lobe. IMPRESSION: Lines and tubes well positioned. Endotracheal tube 5 cm above the carina. Right lower lobe pneumonia persists. Electronically Signed   By: Paulina Fusi M.D.   On: 09/08/2015 13:34  Dg Abd Portable 1v  Result Date: 09/09/2015 CLINICAL DATA:  OG tube placement. EXAM: PORTABLE ABDOMEN - 1 VIEW COMPARISON:  None. FINDINGS: OG tube is in place with the tip in the distal stomach. Nephrostomy on the left tube is noted. IMPRESSION: As above. Electronically Signed   By: Drusilla Kanner M.D.   On: 09/09/2015 13:41   Labs:  CBC:  Recent Labs  09/08/15 0839 09/09/15 0808 09/10/15 0450 09/11/15 0425  WBC 16.6* 24.2* 23.7* 24.0*  HGB 10.8* 9.7* 9.0* 7.8*  HCT 32.9* 29.8* 27.9* 24.2*  PLT 78* 106* 178 151    COAGS:  Recent Labs  09/06/15 0337  INR 1.48    BMP:  Recent Labs  09/08/15 0839 09/09/15 0808 09/10/15 0450 09/11/15 0425  NA 151* 154* 157* 155*  K 3.2* 2.3* 3.2* 3.7  CL 125* 129* >130* >130*  CO2 14* 16* 16* 17*  GLUCOSE 267* 217* 346* 327*  BUN 104* 77* 58* 43*  CALCIUM 8.5* 8.0* 7.8* 7.7*  CREATININE 4.86* 3.54* 2.98* 2.56*  GFRNONAA 12* 18* 22* 27*  GFRAA 14* 21* 26* 31*    LIVER FUNCTION TESTS:  Recent Labs  07/10/15 1231 09/04/15 1102 09/05/15 0059 09/05/15 1006 09/05/15 1317 09/06/15 0337  BILITOT 0.4 3.1* 3.5*  --   --  1.8*  AST --   --  11*  ALT --   --  15*  ALKPHOS 141*  175* 120  --   --  83  PROT 8.1 8.0 6.2*  --   --  6.0*  ALBUMIN 3.9 2.8* 2.2* 2.0* 2.0* 2.3*    Assessment and Plan: S/p left PCN 7/25(stone/hydro); temp 100.1; WBC 24.0(23.7); hgb 7.8(9.0); creat 2.56(2.98); GFR 27;  cont PCN for now, draining well -further plans as outlined by urology depending upon recovery from this acute process; additional plans as per CCM   Electronically Signed: D. Jeananne Rama 09/11/2015, 11:47 AM   I spent a total of 15 minutes at the the patient's bedside AND on the patient's hospital floor or unit, greater than 50% of which was counseling/coordinating care for left nephrostomy

## 2015-09-11 NOTE — Progress Notes (Signed)
RT placed patient back on full vent support due to increased RR and WOB. RN aware.

## 2015-09-11 NOTE — Progress Notes (Addendum)
Pharmacy Antibiotic Note  Cory Salazar is a 55 y.o. male admitted on 09/05/2015 with acute renal failure, sepsis, HCAP, UTI, hydronephrosis requiring nephrostomy (09/06/15).  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  He was initially started on broad spectrum antibiotics with Vancomycin and Zosyn, but was narrowed to Unasyn (unable to tolerate PO Augmentin d/t encephalopathy).  7/27 pt required intubation for suspected aspiration event.  Pharmacy has been consulted for continued Unasyn dosing.  7/30:  Renal function slowly improving, CrCl now > 30 ml/min  Plan:  Increase to Unasyn 1.5 g IV q8h  Follow up renal fxn, culture results, and clinical course.  Height: 6\' 1"  (185.4 cm) Weight: 148 lb 5.9 oz (67.3 kg) IBW/kg (Calculated) : 79.9  Temp (24hrs), Avg:100 F (37.8 C), Min:99 F (37.2 C), Max:100.8 F (38.2 C)   Recent Labs Lab 09/04/15 1418 09/05/15 0059  09/07/15 0607 09/08/15 0839 09/09/15 0808 09/10/15 0450 09/11/15 0425  WBC  --  15.1*  < > 11.9* 16.6* 24.2* 23.7* 24.0*  CREATININE 8.66* 9.08*  < > 6.47* 4.86* 3.54* 2.98* 2.56*  LATICACIDVEN 2.9* 1.1  --   --   --   --   --   --   < > = values in this interval not displayed.  Estimated Creatinine Clearance: 31.8 mL/min (by C-G formula based on SCr of 2.56 mg/dL).    No Known Allergies  Antimicrobials this admission: 7/23 Vancomycin >> 7/25 7/23 Zosyn >> 7/25 7/25 Augmentin >> 7/26 7/26 Unasyn >>   Dose adjustments this admission: 7/30 Unasyn 1.5g q12h -->1.5g q8h for improving renal function  Microbiology results: 7/23 BCx: NGTD 7/23 UCx: NGF  7/23 MRSA PCR: negative 7/24 CDiff antigen / toxin negative 7/24 GI panel negative  Thank you for allowing pharmacy to be a part of this patient's care.  Haynes Hoehn, PharmD, BCPS 09/11/2015, 11:11 AM  Pager: [phone]

## 2015-09-12 ENCOUNTER — Inpatient Hospital Stay (HOSPITAL_COMMUNITY): Payer: Medicare Other

## 2015-09-12 DIAGNOSIS — J9600 Acute respiratory failure, unspecified whether with hypoxia or hypercapnia: Secondary | ICD-10-CM | POA: Diagnosis present

## 2015-09-12 DIAGNOSIS — J690 Pneumonitis due to inhalation of food and vomit: Secondary | ICD-10-CM

## 2015-09-12 LAB — CBC WITH DIFFERENTIAL/PLATELET
BASOS ABS: 0 10*3/uL (ref 0.0–0.1)
Basophils Relative: 0 %
Eosinophils Absolute: 0.2 10*3/uL (ref 0.0–0.7)
Eosinophils Relative: 1 %
HEMATOCRIT: 22.6 % — AB (ref 39.0–52.0)
HEMOGLOBIN: 7.2 g/dL — AB (ref 13.0–17.0)
LYMPHS PCT: 9 %
Lymphs Abs: 2 10*3/uL (ref 0.7–4.0)
MCH: 25 pg — ABNORMAL LOW (ref 26.0–34.0)
MCHC: 31.9 g/dL (ref 30.0–36.0)
MCV: 78.5 fL (ref 78.0–100.0)
MONOS PCT: 5 %
Monocytes Absolute: 1.1 10*3/uL — ABNORMAL HIGH (ref 0.1–1.0)
NEUTROS ABS: 19.2 10*3/uL — AB (ref 1.7–7.7)
NEUTROS PCT: 85 %
Platelets: 142 10*3/uL — ABNORMAL LOW (ref 150–400)
RBC: 2.88 MIL/uL — AB (ref 4.22–5.81)
RDW: 17.5 % — ABNORMAL HIGH (ref 11.5–15.5)
WBC: 22.5 10*3/uL — AB (ref 4.0–10.5)

## 2015-09-12 LAB — RENAL FUNCTION PANEL
ALBUMIN: 1.7 g/dL — AB (ref 3.5–5.0)
ANION GAP: 3 — AB (ref 5–15)
BUN: 41 mg/dL — ABNORMAL HIGH (ref 6–20)
CALCIUM: 7.5 mg/dL — AB (ref 8.9–10.3)
CO2: 20 mmol/L — ABNORMAL LOW (ref 22–32)
Chloride: 126 mmol/L — ABNORMAL HIGH (ref 101–111)
Creatinine, Ser: 2.37 mg/dL — ABNORMAL HIGH (ref 0.61–1.24)
GFR calc Af Amer: 34 mL/min — ABNORMAL LOW (ref >60)
GFR, EST NON AFRICAN AMERICAN: 30 mL/min — AB (ref >60)
Glucose, Bld: 303 mg/dL — ABNORMAL HIGH (ref 65–99)
PHOSPHORUS: 2.5 mg/dL (ref 2.5–4.6)
POTASSIUM: 3 mmol/L — AB (ref 3.5–5.1)
Sodium: 149 mmol/L — ABNORMAL HIGH (ref 135–145)

## 2015-09-12 LAB — GLUCOSE, CAPILLARY
GLUCOSE-CAPILLARY: 259 mg/dL — AB (ref 65–99)
GLUCOSE-CAPILLARY: 283 mg/dL — AB (ref 65–99)
GLUCOSE-CAPILLARY: 314 mg/dL — AB (ref 65–99)
Glucose-Capillary: 291 mg/dL — ABNORMAL HIGH (ref 65–99)
Glucose-Capillary: 281 mg/dL — ABNORMAL HIGH (ref 65–99)
Glucose-Capillary: 280 mg/dL — ABNORMAL HIGH (ref 65–99)
Glucose-Capillary: 255 mg/dL — ABNORMAL HIGH (ref 65–99)

## 2015-09-12 LAB — POTASSIUM: POTASSIUM: 3.3 mmol/L — AB (ref 3.5–5.1)

## 2015-09-12 LAB — MAGNESIUM: MAGNESIUM: 1.8 mg/dL (ref 1.7–2.4)

## 2015-09-12 MED ORDER — PHENYTOIN 125 MG/5ML PO SUSP
300.0000 mg | Freq: Every day | ORAL | Status: DC
  Administered 2015-09-12 – 2015-09-18 (×7): 300 mg
  Filled 2015-09-12 (×8): qty 12

## 2015-09-12 MED ORDER — OSMOLITE 1.5 CAL PO LIQD
1000.0000 mL | ORAL | Status: DC
  Administered 2015-09-12 – 2015-09-18 (×7): 1000 mL
  Filled 2015-09-12 (×12): qty 1000

## 2015-09-12 MED ORDER — POTASSIUM CHLORIDE CRYS ER 20 MEQ PO TBCR: 40.0000 meq | EXTENDED_RELEASE_TABLET | Freq: Once | ORAL | Status: DC

## 2015-09-12 MED ORDER — MAGNESIUM SULFATE 2 GM/50ML IV SOLN
2.0000 g | Freq: Once | INTRAVENOUS | Status: AC
  Administered 2015-09-12: 2 g via INTRAVENOUS
  Filled 2015-09-12: qty 50

## 2015-09-12 MED ORDER — LEVETIRACETAM 100 MG/ML PO SOLN
500.0000 mg | Freq: Two times a day (BID) | ORAL | Status: DC
  Administered 2015-09-12 – 2015-09-18 (×14): 500 mg
  Filled 2015-09-12 (×21): qty 5

## 2015-09-12 MED ORDER — PANTOPRAZOLE SODIUM 40 MG PO PACK
40.0000 mg | PACK | Freq: Every day | ORAL | Status: DC
  Administered 2015-09-12 – 2015-09-18 (×7): 40 mg
  Filled 2015-09-12 (×6): qty 20

## 2015-09-12 MED ORDER — POTASSIUM CHLORIDE 20 MEQ/15ML (10%) PO SOLN
40.0000 meq | Freq: Two times a day (BID) | ORAL | Status: AC
  Administered 2015-09-12 (×2): 40 meq
  Filled 2015-09-12 (×2): qty 30

## 2015-09-12 MED ORDER — SODIUM CHLORIDE 0.9 % IV SOLN
1.5000 g | Freq: Four times a day (QID) | INTRAVENOUS | Status: AC
  Administered 2015-09-12 (×3): 1.5 g via INTRAVENOUS
  Filled 2015-09-12 (×3): qty 1.5

## 2015-09-12 MED ORDER — POTASSIUM CHLORIDE 20 MEQ/15ML (10%) PO SOLN
40.0000 meq | Freq: Once | ORAL | Status: AC
  Administered 2015-09-12: 40 meq
  Filled 2015-09-12: qty 30

## 2015-09-12 NOTE — Progress Notes (Signed)
Inpatient Diabetes Program Recommendations  AACE/ADA: New Consensus Statement on Inpatient Glycemic Control (2015)  Target Ranges:  Prepandial:   less than 140 mg/dL      Peak postprandial:   less than 180 mg/dL (1-2 hours)      Critically ill patients:  140 - 180 mg/dL   Lab Results  Component Value Date   GLUCAP 314 (H) 09/12/2015    Review of Glycemic Control  For Best Practice, recommend ICU protocol for management of diabetes.  Inpatient Diabetes Program Recommendations:    Please consider ICU Glycemic Control Protocol for glucose management.  Will continue to follow. Thank you. Ailene Ards, RD, LDN, CDE Inpatient Diabetes Coordinator [phone]

## 2015-09-12 NOTE — Progress Notes (Signed)
Pharmacy Antibiotic Note  Cory Salazar is a 55 y.o. male admitted on 09/05/2015 with acute renal failure, sepsis, HCAP, UTI, hydronephrosis requiring nephrostomy (09/06/15).  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  He was initially started on broad spectrum antibiotics with Vancomycin and Zosyn, but was narrowed to Unasyn (unable to tolerate PO Augmentin d/t encephalopathy).  7/27 pt required intubation for suspected aspiration event.  Pharmacy has been consulted for continued Unasyn dosing.  7/31:  Renal function slowly improving, CrCl now > 30 ml/min  Plan:  Increase to Unasyn 1.5 g IV q6h  Follow up renal fxn, culture results, and clinical course.  Height: 6\' 1"  (185.4 cm) Weight: 145 lb 4.5 oz (65.9 kg) IBW/kg (Calculated) : 79.9  Temp (24hrs), Avg:101 F (38.3 C), Min:100.1 F (37.8 C), Max:102.2 F (39 C)   Recent Labs Lab 09/08/15 0839 09/09/15 0808 09/10/15 0450 09/11/15 0425 09/12/15 0352  WBC 16.6* 24.2* 23.7* 24.0* 22.5*  CREATININE 4.86* 3.54* 2.98* 2.56* 2.37*    Estimated Creatinine Clearance: 33.6 mL/min (by C-G formula based on SCr of 2.37 mg/dL).    No Known Allergies  Antimicrobials this admission: 7/23 Vancomycin >> 7/25 7/23 Zosyn >> 7/25 7/25 Augmentin >> 7/26 7/26 Unasyn >>   Dose adjustments this admission: 7/30 Unasyn 1.5g q12h -->1.5g q8h for improving renal function  Microbiology results: 7/23 BCx: NGTD 7/23 UCx: NGF  7/23 MRSA PCR: negative 7/24 CDiff antigen / toxin negative 7/24 GI panel negative   Thank you for allowing pharmacy to be a part of this patient's care.   Hessie Knows, PharmD, BCPS Pager [phone] 09/12/2015 9:47 AM

## 2015-09-12 NOTE — Progress Notes (Signed)
Pt placed back on full vent support due to increased WOB 

## 2015-09-12 NOTE — Progress Notes (Signed)
Increased PS to 8 due to increased RR's.

## 2015-09-12 NOTE — Progress Notes (Addendum)
Nutrition Follow-up  DOCUMENTATION CODES:   Non-severe (moderate) malnutrition in context of chronic illness  INTERVENTION:  Continue to monitor magnesium, potassium, and phosphorus daily for at least 3 days, MD to replete as needed, as pt is at risk for refeeding syndrome. Increase Osmolite 1.5 to 61mL/hr Pro-Stat 59mL per day, each supplement provides 100 calories and 15 grams of protein  Continue 400 ml free water every 4 hours per MD/NP This tube feeding regimen +current D5 infusion provides 2284 kcal (100% of needs), 98g protein (98% of needs) and 3314 ml H2O.  RD to continue to monitor  NUTRITION DIAGNOSIS:   Inadequate oral intake related to inability to eat as evidenced by NPO status. -ongoing  GOAL:   Patient will meet greater than or equal to 90% of their needs -meeting with TF & D5 Infusion  MONITOR:   Vent status, Weight trends, Labs, Skin, I & O's  REASON FOR ASSESSMENT:   Low Braden    ASSESSMENT:   55 y.o. 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 In ER, pt was tachycardic and febrile. WBC=17k. CXR shows right-sided pneumonia. Pt was transitioned to [facility] for further evaluation and recommendations given limitations of care at [facility]. Urology is aware and critical care consulted. Patient will be admitted to step down for further care. Unable to obtain history from patient.  Patient is currently intubated on ventilator support MV: 15.4 L/min Temp (24hrs), Avg:101.2 F (38.4 C), Min:100.2 F (37.9 C), Max:102.2 F (39 C)  Propofol: none ml/hr  Needs re-estimated, pts fever increased. Tolerating tf @ 41mL/hr, pro-stat increased to 61mL/hr Weaning from vent currently.   Labs and Medications reviewed: D5 @ 28mL/hr --> 204 calories CBGs 259-314; Na 149; K 3.3; Ca 7.5; Tot Bilirubin 1.8; Albumin 1.7;  KCL BID  Diet Order:  Diet NPO time  specified  Skin:  Wound (see comment) (Stage 1 heel pressure injury, DTIs to bilateral buttocks)  Last BM:  7/24  Height:   Ht Readings from Last 1 Encounters:  09/08/15 6\' 1"  (1.854 m)    Weight:   Wt Readings from Last 1 Encounters:  09/12/15 145 lb 4.5 oz (65.9 kg)    Ideal Body Weight:  83.64 kg (kg)  BMI:  Body mass index is 19.17 kg/m.  Estimated Nutritional Needs:   Kcal:  2277  Protein:  100-110g  Fluid:  2L/day  EDUCATION NEEDS:   No education needs identified at this time  Dionne Ano. Verbon Giangregorio, MS, RD LDN Inpatient Clinical Dietitian Pager [phone]

## 2015-09-12 NOTE — Progress Notes (Signed)
eLink Physician-Brief Progress Note Patient Name: Cory Salazar DOB: [birth year] MRN: [id number]   Date of Service  09/12/2015  HPI/Events of Note  K=3  eICU Interventions  Replace K and check K     Intervention Category Intermediate Interventions: Other:  [name] 09/12/2015, 5:37 AM

## 2015-09-12 NOTE — Progress Notes (Signed)
PULMONARY / CRITICAL CARE MEDICINE   Name: Cory Salazar MRN: [id number] DOB: 03-15-1960    ADMISSION DATE:  09/05/2015 CONSULTATION DATE:  09/05/15  REFERRING MD: Amado Coe, A  CHIEF COMPLAINT:  Acute Renal Failure  Brief: 55 y/o male admitted to [facility] on 7/24 with fever, possible pneumonia and AKI.  Baseline "encephalopathy" post treatment for brain cancer (remote).  He was treated with antibiotics but due to ongoing renal failure.  Found to have obstructing stone s/p perc nephrostomy.  PCCM was consulted for possible CVVHD. He was transferred to [facility] due to an infrastructure emergency at [facility].   SUBJECTIVE:  RN reports loose stool overnight on lactulose.  AM dose held.  Pt weaning on PSV 8/5 with good volumes.     VITAL SIGNS: BP 117/71   Pulse (!) 110   Temp 100.2 F (37.9 C) (Axillary)   Resp (!) 31   Ht  (1.854 m)   Wt 145 lb 4.5 oz (65.9 kg)   SpO2 100%   BMI 19.17 kg/m   HEMODYNAMICS:     VENTILATOR SETTINGS: Vent Mode: CPAP;PSV FiO2 (%):  [40 %] 40 % Set Rate:  [18 bmp] 18 bmp Vt Set:  [640 mL] 640 mL PEEP:  [5 cmH20] 5 cmH20 Pressure Support:  [5 cmH20-8 cmH20] 8 cmH20 Plateau Pressure:  [16 cmH20-26 cmH20] 16 cmH20  INTAKE / OUTPUT: I/O last 3 completed shifts: In: 7403.7 [I.V.:1775; Other:297.5; XL/KG:4010.2; IV Piggyback:772] Out: 4000 [Urine:4000]  PHYSICAL EXAMINATION: General:  Adult male on vent in NAD HEENT: Endotracheal tube in place. Moist mucous membranes. No scleral icterus. Pulmonary: even/non-labored on PSV 8/5, lungs bilaterally clear Cardiovascular: Regular rhythm. No appreciable JVD. No edema. Abdomen: Soft. Nondistended. Normoactive bowel sounds. Musculoskeletal: Symmetrically decreased muscle bulk. No joint effusion appreciated. Neurological: Not following commands. Eyes more open today, opens eyes to voice, wiggles toes on command but does not follow other simple commands  BMET  Recent Labs Lab 09/10/15 0450 09/11/15 0425  09/12/15 0352 09/12/15 1010  NA 157* 155* 149*  --   K 3.2* 3.7 3.0* 3.3*  CL >130* >130* 126*  --   CO2 16* 17* 20*  --   BUN 58* 43* 41*  --   CREATININE 2.98* 2.56* 2.37*  --   GLUCOSE 346* 327* 303*  --     Electrolytes  Recent Labs Lab 09/10/15 0450 09/10/15 1700 09/11/15 0425 09/11/15 1047 09/12/15 0352  CALCIUM 7.8*  --  7.7*  --  7.5*  MG 1.6* 2.1  --  1.9 1.8  PHOS 1.3* 2.9  --  2.6 2.5    CBC  Recent Labs Lab 09/10/15 0450 09/11/15 0425 09/12/15 0352  WBC 23.7* 24.0* 22.5*  HGB 9.0* 7.8* 7.2*  HCT 27.9* 24.2* 22.6*  PLT 178 151 142*    Coag's  Recent Labs Lab 09/06/15 0337  INR 1.48    Sepsis Markers  Recent Labs Lab 09/09/15 0808 09/10/15 0450 09/11/15 0425  PROCALCITON 19.65 13.84 7.64    ABG  Recent Labs Lab 09/08/15 1230 09/08/15 1300 09/09/15 0630  PHART 7.232* 7.287* 7.388  PCO2ART 40.8 36.1 27.2*  PO2ART 64.9* 347* 96.5    Liver Enzymes  Recent Labs Lab 09/05/15 1317 09/06/15 0337 09/12/15 0352  AST  --  11*  --   ALT  --  15*  --   ALKPHOS  --  83  --   BILITOT  --  1.8*  --   ALBUMIN 2.0* 2.3* 1.7*    Cardiac Enzymes  No results for input(s): TROPONINI, PROBNP in the last 168 hours.  Glucose  Recent Labs Lab 09/11/15 1002 09/11/15 1229 09/11/15 1643 09/11/15 1942 09/12/15 0027 09/12/15 0803  GLUCAP 385* 449* 294* 291* 259* 281*    Imaging No results found.   STUDIES:  Renal US 7/23: Mild hydronephrosis on the left CT Abd/Pelvis 7/24: small superior uretal stone in with mild pelviectasis, additional non-obstructing stone on R, rectal wall thickening, bibasilar atelectasis Port CXR 7/29: Left subclavian central venous catheter in place. Endotracheal tube in good position. Enteric feeding tube coursing below diaphragm. Right basal opacity persists.  MICROBIOLOGY: MRSA PCR 7/23:  Negative  Urine Ctx 7/23:  Negative  Blood Ctx x2 7/23 >>  Stool C diff 7/24:  Negative   ANTIBIOTICS: Zosyn  7/24 - 7/25 Vancomycin 7/25 Augmentin 7/25 - 7/26 Unasyn 7/25 >>   SIGNIFICANT EVENTS: 7/24 - Admit to ICU w/ R Pneumonia & Acute Renal Failure on CRRT 7/25 - Perc nephrostomy tube placed 7/26 - Transferred to floor 7/27 - Transfer to ICU for acute hypoxic resp failure & increased work of breathing  LINES/TUBES: L Fem CVL 7/24 - 7/26 L Perc Nephrostomy Tube 7/25 >>  OETT 7.5 7/26 >> L Subclav CVL 7/27 >> Foley 7/26 >> OGT 7/28 >> PIV x1  ASSESSMENT / PLAN:  PULMONARY A: Acute Hypoxic Respiratory Failure - Decompensated & required reintubation 7/27. HCAP - Probable aspiration R basilar opacity.  P:   PRVC 8 cc/kg Wean PEEP / FiO2 for sats >92% Intermittent CXR & ABG Aspiration precautions Albuterol Neb PRN Will need to clarify re-intubation status with sister prior to extubation SBT / WUA as tolerated See ID  ID:  A: Aspiration Pneumonia  P: Empiric Unasyn Day #7, discontinue after 7/31 dosing Awaiting finalization of cultures Plan to re-culture for fever  CARDIOVASCULAR A:  SVT  P:  Continuing telemetry monitoring  Vitals per unit protocol Goal MAP >65  RENAL A:   Acute Renal Failure - Slow improvement. Hypernatremia - Mild improvement. Approximately 4.3L free water deficit. Hypokalemia  Hypomagnesemia  Hypophosphatemia  Hyperchloremia - improving Metabolic Acidosis - Likely secondary to hyperchloremia. L Hydronephrosis - S/P Perc Nephrostomy. H/O BPH  P:   Renal previously following, signed off 7/26 Rec's for continued gentle IVF's Not a dialysis candidate per Nephrology IR following for perc nephrostomy care Monitoring UOP with Foley & Nephrostomy tube Trending electrolytes & renal function daily D5W @ 50 mL/hr Increase Free Water to 400cc VT q4hr Replace electrolytes as indicated, K/Mg 7/31 Bethanechol VT tid   GI:  A: At Risk Aspiration  Dysphagia  Protein Calorie Malnutrition   P: NPO Continuing Tube Feedings Pt has living  will that states no PEG tube Speech Eval if extubated Pepcid IV q24hr  HEMATOLOGY: A: Leukcytosis - Stable. Anemia - No signs of active bleeding.  P: Trending cell counts daily w/ CBC Transfuse for Hgb <7.0 or active bleeding SCDs  ENDOCRINE A: H/O DM - Glucose uncontrolled. Complicated by D5 IVF.  P: NPH 20u Dickinson q8hr at noon Continue Accu-Checks q4hr SSI per Moderate Algorithm  NEUROLOGIC: A: Sedation on Ventilator H/O Chronic Dementia S/P Tumor Resection - 20 years ago H/O Seizures  P: Desired RASS:  0 to -1 Fentanyl IV prn Pain Versed IV prn Sedation Supportive care AED:  Keppra & Lamictal Ativan IV prn Seizure Hold lactulose Zoloft qday  FAMILY UPDATE: Patient's sister Cory Salazar) updated by phone 7/29 by Dr. Jamison Neighbor.  No family at bedside am 7/31.  Will  need to clarify if she would want him re-intubated prior to extubation.      Canary Brim, NP-C [facility] Pgr: [phone] or if no answer [phone] 09/12/2015, 10:58 AM   Tolerating pressure support some.  Follows simple commands.  HR regular/tachy.  Rt > Lt crackles.  Abd soft.  No edema.  WBC 22.5, Hb 7.2, Na 149, K 3.3, Creatinine 2.37, Procalcitonin 7.64  Assessment/plan:  Acute respiratory failure with hypoxia from aspiration PNA. - wean as tolerated >> not ready for extubation yet - ongoing discussions with family about whether trach should be considered if he does not continue to improve - continue ABx through 7/31  AKI. Hypernatremia. - continue IV fluid, free water - not candidate for HD due to chronic medical conditions  Hx of dementia after brain tumor resection. Hx of seizures. - RASS goal 0 to -1 - continue AEDs  Diarrhea. - hold lactulose for now  CC time by me independent of APP time 31 minutes.  Coralyn Helling, MD [facility] Pulmonary/Critical Care 09/12/2015, 11:38 AM Pager:  [phone] After 3pm call: [phone]

## 2015-09-12 NOTE — Progress Notes (Signed)
Dr Craige Cotta notifed patient has had  Two loose bms since midnight. Lactulose 10 am dose held.

## 2015-09-13 LAB — CBC
HEMATOCRIT: 22.3 % — AB (ref 39.0–52.0)
HEMOGLOBIN: 7 g/dL — AB (ref 13.0–17.0)
MCH: 24.6 pg — ABNORMAL LOW (ref 26.0–34.0)
MCHC: 31.4 g/dL (ref 30.0–36.0)
MCV: 78.5 fL (ref 78.0–100.0)
Platelets: 141 10*3/uL — ABNORMAL LOW (ref 150–400)
RBC: 2.84 MIL/uL — AB (ref 4.22–5.81)
RDW: 17.3 % — ABNORMAL HIGH (ref 11.5–15.5)
WBC: 20.5 10*3/uL — AB (ref 4.0–10.5)

## 2015-09-13 LAB — BASIC METABOLIC PANEL
ANION GAP: 4 — AB (ref 5–15)
BUN: 35 mg/dL — ABNORMAL HIGH (ref 6–20)
CO2: 20 mmol/L — ABNORMAL LOW (ref 22–32)
Calcium: 7.4 mg/dL — ABNORMAL LOW (ref 8.9–10.3)
Chloride: 117 mmol/L — ABNORMAL HIGH (ref 101–111)
Creatinine, Ser: 2.03 mg/dL — ABNORMAL HIGH (ref 0.61–1.24)
GFR, EST AFRICAN AMERICAN: 41 mL/min — AB (ref >60)
GFR, EST NON AFRICAN AMERICAN: 36 mL/min — AB (ref >60)
GLUCOSE: 354 mg/dL — AB (ref 65–99)
POTASSIUM: 3.8 mmol/L (ref 3.5–5.1)
Sodium: 141 mmol/L (ref 135–145)

## 2015-09-13 LAB — GLUCOSE, CAPILLARY
GLUCOSE-CAPILLARY: 278 mg/dL — AB (ref 65–99)
GLUCOSE-CAPILLARY: 282 mg/dL — AB (ref 65–99)
GLUCOSE-CAPILLARY: 326 mg/dL — AB (ref 65–99)
GLUCOSE-CAPILLARY: 328 mg/dL — AB (ref 65–99)
Glucose-Capillary: 293 mg/dL — ABNORMAL HIGH (ref 65–99)
Glucose-Capillary: 310 mg/dL — ABNORMAL HIGH (ref 65–99)

## 2015-09-13 MED ORDER — INSULIN GLARGINE 100 UNIT/ML ~~LOC~~ SOLN
30.0000 [IU] | Freq: Every day | SUBCUTANEOUS | Status: DC
  Administered 2015-09-13: 30 [IU] via SUBCUTANEOUS
  Filled 2015-09-13: qty 0.3

## 2015-09-13 MED ORDER — FREE WATER
200.0000 mL | Freq: Three times a day (TID) | Status: DC
  Administered 2015-09-13 (×2): 200 mL

## 2015-09-13 MED ORDER — SODIUM CHLORIDE 0.9% FLUSH
10.0000 mL | Freq: Two times a day (BID) | INTRAVENOUS | Status: DC
  Administered 2015-09-13 – 2015-09-17 (×8): 10 mL
  Administered 2015-09-17 – 2015-09-18 (×2): 20 mL
  Administered 2015-09-19 – 2015-09-20 (×2): 10 mL

## 2015-09-13 MED ORDER — SODIUM CHLORIDE 0.9% FLUSH
10.0000 mL | INTRAVENOUS | Status: DC | PRN
Start: 2015-09-13 — End: 2015-09-20

## 2015-09-13 MED ORDER — INSULIN ASPART 100 UNIT/ML ~~LOC~~ SOLN
0.0000 [IU] | SUBCUTANEOUS | Status: DC
  Administered 2015-09-13: 15 [IU] via SUBCUTANEOUS
  Administered 2015-09-13: 11 [IU] via SUBCUTANEOUS
  Administered 2015-09-13: 15 [IU] via SUBCUTANEOUS
  Administered 2015-09-14: 11 [IU] via SUBCUTANEOUS
  Administered 2015-09-14 (×3): 15 [IU] via SUBCUTANEOUS
  Administered 2015-09-14: 11 [IU] via SUBCUTANEOUS
  Administered 2015-09-14: 15 [IU] via SUBCUTANEOUS
  Administered 2015-09-15: 7 [IU] via SUBCUTANEOUS
  Administered 2015-09-15: 15 [IU] via SUBCUTANEOUS
  Administered 2015-09-15: 7 [IU] via SUBCUTANEOUS
  Administered 2015-09-15: 11 [IU] via SUBCUTANEOUS
  Administered 2015-09-15 (×2): 7 [IU] via SUBCUTANEOUS
  Administered 2015-09-16 (×2): 3 [IU] via SUBCUTANEOUS
  Administered 2015-09-16: 4 [IU] via SUBCUTANEOUS
  Administered 2015-09-16: 3 [IU] via SUBCUTANEOUS
  Administered 2015-09-16 (×2): 4 [IU] via SUBCUTANEOUS
  Administered 2015-09-17 – 2015-09-18 (×4): 7 [IU] via SUBCUTANEOUS
  Administered 2015-09-18: 4 [IU] via SUBCUTANEOUS
  Administered 2015-09-18: 3 [IU] via SUBCUTANEOUS
  Administered 2015-09-18: 11 [IU] via SUBCUTANEOUS
  Administered 2015-09-18 – 2015-09-19 (×3): 4 [IU] via SUBCUTANEOUS
  Administered 2015-09-20: 3 [IU] via SUBCUTANEOUS
  Administered 2015-09-20: 4 [IU] via SUBCUTANEOUS

## 2015-09-13 MED ORDER — DEXTROSE-NACL 5-0.45 % IV SOLN
INTRAVENOUS | Status: DC
  Administered 2015-09-13 – 2015-09-14 (×2): via INTRAVENOUS

## 2015-09-13 NOTE — Progress Notes (Signed)
Patient ID: Cory Salazar, male   DOB: Dec 21, 1960, 55 y.o.   MRN: [id number]    Referring Physician(s): Grapey,D  Supervising Physician: Richarda Overlie  Patient Status:  Inpatient  Chief Complaint: Left hydronephrosis/stone/urosepsis  Subjective:  Intubated, increased HR/RR with recent wean attempt  Allergies: Review of patient's allergies indicates no known allergies.  Medications: Prior to Admission medications   Medication Sig Start Date End Date Taking? Authorizing Provider  acetaminophen (TYLENOL) 325 MG tablet Take 650 mg by mouth every 4 (four) hours as needed for mild pain, moderate pain or fever.   Yes Historical Provider, MD  insulin glargine (LANTUS) 100 UNIT/ML injection Inject 0.2 mLs (20 Units total) into the skin at bedtime. 09/05/15  Yes Ramonita Lab, MD  ipratropium-albuterol (DUONEB) 0.5-2.5 (3) MG/3ML SOLN Take 3 mLs by nebulization 4 (four) times daily. 09/05/15  Yes Ramonita Lab, MD  lactulose (CHRONULAC) 10 GM/15ML solution Take 30 mLs (20 g total) by mouth 2 (two) times daily. 09/05/15  Yes Ramonita Lab, MD  lamoTRIgine (LAMICTAL) 100 MG tablet Take 1 tablet (100 mg total) by mouth 2 (two) times daily. 09/05/15  Yes Ramonita Lab, MD  levETIRAcetam 500 mg in sodium chloride 0.9 % 100 mL Inject 500 mg into the vein every 12 (twelve) hours. 09/05/15  Yes Ramonita Lab, MD  LORazepam (ATIVAN) 2 MG/ML injection Inject 0.5 mLs (1 mg total) into the vein every 4 (four) hours as needed for seizure. 09/05/15  Yes Ramonita Lab, MD  mirtazapine (REMERON) 15 MG tablet Take 1 tablet (15 mg total) by mouth at bedtime. 09/05/15  Yes Ramonita Lab, MD  phenytoin (DILANTIN) 50 MG tablet Chew 6 tablets (300 mg total) by mouth at bedtime. 07/10/15 07/09/16 Yes Jeanmarie Plant, MD  piperacillin-tazobactam (ZOSYN) 3.375 GM/50ML IVPB Inject 50 mLs (3.375 g total) into the vein every 8 (eight) hours. 09/05/15  Yes Ramonita Lab, MD  potassium chloride SA (K-DUR,KLOR-CON) 20 MEQ tablet Take 20 mEq by mouth 2  (two) times daily.   Yes Historical Provider, MD  QUEtiapine (SEROQUEL) 200 MG tablet Take 200 mg by mouth 2 (two) times daily. **Medication on hold from 09/01/15 to 09/04/15 per MD at [facility]**   Yes Historical Provider, MD  sertraline (ZOLOFT) 50 MG tablet Take 50 mg by mouth daily.   Yes Historical Provider, MD  sodium chloride 0.45 % solution Inject 100 mLs into the vein continuous. 09/05/15  Yes Ramonita Lab, MD  sodium chloride flush (NS) 0.9 % SOLN Inject 3 mLs into the vein every 12 (twelve) hours. 09/05/15  Yes Ramonita Lab, MD  tamsulosin (FLOMAX) 0.4 MG CAPS capsule Take 0.4 mg by mouth daily.   Yes Historical Provider, MD     Vital Signs: BP 118/74   Pulse (!) 114   Temp 99.2 F (37.3 C) (Axillary)   Resp (!) 21   Ht 6\' 1"  (1.854 m)   Wt 153 lb 7 oz (69.6 kg)   SpO2 97%   BMI 20.24 kg/m   Physical Exam left PCN intact, dressing dry, output 700 cc yellow urine  Imaging: Dg Chest Port 1 View  Result Date: 09/12/2015 CLINICAL DATA:  Acute respiratory failure EXAM: PORTABLE CHEST 1 VIEW COMPARISON:  09/10/2015 FINDINGS: Endotracheal tube, nasogastric catheter left subclavian central line are again seen and stable. Patchy infiltrative changes are noted in the bases right greater than left increased from the prior exam. No effusion or pneumothorax is seen. No bony abnormality is noted. IMPRESSION: Increasing bibasilar infiltrates. Electronically Signed  By: Alcide Clever M.D.   On: 09/12/2015 12:55   Dg Chest Port 1 View  Result Date: 09/10/2015 CLINICAL DATA:  Acute respiratory failure EXAM: PORTABLE CHEST 1 VIEW COMPARISON:  September 09, 2015 FINDINGS: The NG tube terminates below today's film. Support apparatus is otherwise stable. No pneumothorax. No pulmonary nodules or masses. Focal opacity in the medial right lung base persists. Recommend follow-up to resolution. IMPRESSION: 1. Stable support apparatus. 2. Persistent focal opacity in the medial right lung base.  Electronically Signed   By: Gerome Sam III M.D   On: 09/10/2015 07:37   Labs:  CBC:  Recent Labs  09/10/15 0450 09/11/15 0425 09/12/15 0352 09/13/15 0440  WBC 23.7* 24.0* 22.5* 20.5*  HGB 9.0* 7.8* 7.2* 7.0*  HCT 27.9* 24.2* 22.6* 22.3*  PLT 178 151 142* 141*    COAGS:  Recent Labs  09/06/15 0337  INR 1.48    BMP:  Recent Labs  09/10/15 0450 09/11/15 0425 09/12/15 0352 09/12/15 1010 09/13/15 0440  NA 157* 155* 149*  --  141  K 3.2* 3.7 3.0* 3.3* 3.8  CL >130* >130* 126*  --  117*  CO2 16* 17* 20*  --  20*  GLUCOSE 346* 327* 303*  --  354*  BUN 58* 43* 41*  --  35*  CALCIUM 7.8* 7.7* 7.5*  --  7.4*  CREATININE 2.98* 2.56* 2.37*  --  2.03*  GFRNONAA 22* 27* 30*  --  36*  GFRAA 26* 31* 34*  --  41*    LIVER FUNCTION TESTS:  Recent Labs  07/10/15 1231 09/04/15 1102 09/05/15 0059 09/05/15 1006 09/05/15 1317 09/06/15 0337 09/12/15 0352  BILITOT 0.4 3.1* 3.5*  --   --  1.8*  --   AST --   --  11*  --   ALT --   --  15*  --   ALKPHOS 141* 175* 120  --   --  83  --   PROT 8.1 8.0 6.2*  --   --  6.0*  --   ALBUMIN 3.9 2.8* 2.2* 2.0* 2.0* 2.3* 1.7*    Assessment and Plan: S/p left PCN 7/25(stone/hydro); WBC 20.5(22.5), hgb 7.0(7.2)- no hematuria , creat 2.03(2.37), GFR up to 36(30); cont PCN for now, draining well -further plans as outlined by urology depending upon recovery from this acute process; additional plans as per CCM   Electronically Signed: D. Jeananne Rama 09/13/2015, 4:40 PM   I spent a total of 15 minutes at the the patient's bedside AND on the patient's hospital floor or unit, greater than 50% of which was counseling/coordinating care for left perc nephrostomy

## 2015-09-13 NOTE — Progress Notes (Signed)
Dr Craige Cotta aware of labs and Hgb 7.0.

## 2015-09-13 NOTE — Progress Notes (Signed)
CSW assisting with d/c planning. Pt is from [facility]. Pt remains on vent. CSW will continue to follow to assist with d/c planning needs.  Cori Razor LCSW [phone]

## 2015-09-13 NOTE — Progress Notes (Signed)
PULMONARY / CRITICAL CARE MEDICINE   Name: Merced Hanners MRN: [id number] DOB: [birth year]    ADMISSION DATE:  09/05/2015 CONSULTATION DATE:  09/05/15  REFERRING MD: Amado Coe, A  CHIEF COMPLAINT:  Acute Renal Failure  Brief: 55 y/o male admitted to [facility] on 7/24 with fever, possible pneumonia and AKI.  Baseline "encephalopathy" post treatment for brain cancer (remote).  He was treated with antibiotics but due to ongoing renal failure.  Found to have obstructing stone s/p perc nephrostomy.  PCCM was consulted for possible CVVHD. He was transferred to [facility] due to an infrastructure emergency at [facility].   SUBJECTIVE:  Pt weaning on PSV 8/5 with no distress.  More alert this am. Weaned most of the day 7/31, had to go back to full support at 4:30 PM due to increased WOB.        VITAL SIGNS: BP 108/70   Pulse (!) 103   Temp 100.1 F (37.8 C) (Axillary)   Resp 17   Ht  (1.854 m)   Wt 153 lb 7 oz (69.6 kg)   SpO2 98%   BMI 20.24 kg/m   HEMODYNAMICS:     VENTILATOR SETTINGS: Vent Mode: PRVC FiO2 (%):  [40 %] 40 % Set Rate:  [18 bmp] 18 bmp Vt Set:  [640 mL] 640 mL PEEP:  [5 cmH20] 5 cmH20 Pressure Support:  [8 cmH20] 8 cmH20 Plateau Pressure:  [12 cmH20-23 cmH20] 12 cmH20  INTAKE / OUTPUT: I/O last 3 completed shifts: In: 7356 [I.V.:1750; Other:370; WU/JW:1191; IV Piggyback:411] Out: 4695 [Urine:4695]  PHYSICAL EXAMINATION: General:  Adult male on vent in NAD HEENT: Endotracheal tube in place. Moist mucous membranes. No scleral icterus. Pulmonary: even/non-labored on PSV 8/5, lungs bilaterally clear Cardiovascular: Regular rhythm. No appreciable JVD. No edema. Abdomen: Soft. Nondistended. Normoactive bowel sounds. Musculoskeletal: Symmetrically decreased muscle bulk. No joint effusion appreciated. Neurological: Not following commands. More alert, eyes more open today, nods yes to voice   BMET  Recent Labs Lab 09/11/15 0425 09/12/15 0352 09/12/15 1010 09/13/15 0440  NA  155* 149*  --  141  K 3.7 3.0* 3.3* 3.8  CL >130* 126*  --  117*  CO2 17* 20*  --  20*  BUN 43* 41*  --  35*  CREATININE 2.56* 2.37*  --  2.03*  GLUCOSE 327* 303*  --  354*    Electrolytes  Recent Labs Lab 09/10/15 1700 09/11/15 0425 09/11/15 1047 09/12/15 0352 09/13/15 0440  CALCIUM  --  7.7*  --  7.5* 7.4*  MG 2.1  --  1.9 1.8  --   PHOS 2.9  --  2.6 2.5  --     CBC  Recent Labs Lab 09/11/15 0425 09/12/15 0352 09/13/15 0440  WBC 24.0* 22.5* 20.5*  HGB 7.8* 7.2* 7.0*  HCT 24.2* 22.6* 22.3*  PLT 151 142* 141*    Coag's No results for input(s): APTT, INR in the last 168 hours.  Sepsis Markers  Recent Labs Lab 09/09/15 0808 09/10/15 0450 09/11/15 0425  PROCALCITON 19.65 13.84 7.64    ABG  Recent Labs Lab 09/08/15 1230 09/08/15 1300 09/09/15 0630  PHART 7.232* 7.287* 7.388  PCO2ART 40.8 36.1 27.2*  PO2ART 64.9* 347* 96.5    Liver Enzymes  Recent Labs Lab 09/12/15 0352  ALBUMIN 1.7*    Cardiac Enzymes No results for input(s): TROPONINI, PROBNP in the last 168 hours.  Glucose  Recent Labs Lab 09/12/15 1142 09/12/15 1554 09/12/15 1946 09/12/15 2359 09/13/15 0402 09/13/15 0739  GLUCAP 314*  280* 255* 282* 328* 293*    Imaging Dg Chest Port 1 View  Result Date: 09/12/2015 CLINICAL DATA:  Acute respiratory failure EXAM: PORTABLE CHEST 1 VIEW COMPARISON:  09/10/2015 FINDINGS: Endotracheal tube, nasogastric catheter left subclavian central line are again seen and stable. Patchy infiltrative changes are noted in the bases right greater than left increased from the prior exam. No effusion or pneumothorax is seen. No bony abnormality is noted. IMPRESSION: Increasing bibasilar infiltrates. Electronically Signed   By: Alcide Clever M.D.   On: 09/12/2015 12:55     STUDIES:  Renal US 7/23: Mild hydronephrosis on the left CT Abd/Pelvis 7/24: small superior uretal stone in with mild pelviectasis, additional non-obstructing stone on R, rectal  wall thickening, bibasilar atelectasis Port CXR 7/29: Left subclavian central venous catheter in place. Endotracheal tube in good position. Enteric feeding tube coursing below diaphragm. Right basal opacity persists.  MICROBIOLOGY: MRSA PCR 7/23:  Negative  Urine Ctx 7/23:  Negative  Blood Ctx x2 7/23 >>  Stool C diff 7/24:  Negative  Sputum 8/01 >>  ANTIBIOTICS: Zosyn 7/24 - 7/25 Vancomycin 7/25 Augmentin 7/25 - 7/26 Unasyn 7/25 >> 7/31  SIGNIFICANT EVENTS: 7/24 - Admit to ICU w/ R Pneumonia & Acute Renal Failure on CRRT 7/25 - Perc nephrostomy tube placed 7/26 - Transferred to floor 7/27 - Transfer to ICU for acute hypoxic resp failure & increased work of breathing 7/31 - Weaned on PSV 8/5 ~ 7-8 hours  LINES/TUBES: L Fem CVL 7/24 - 7/26 L Perc Nephrostomy Tube 7/25 >>  OETT 7.5 7/26 >> L Subclav CVL 7/27 >> Foley 7/26 >> OGT 7/28 >>   ASSESSMENT / PLAN:  PULMONARY A: Acute Hypoxic Respiratory Failure - Decompensated & required reintubation 7/27. HCAP - Probable aspiration R basilar opacity.  P:   PRVC 8 cc/kg Wean PEEP / FiO2 for sats >92% Intermittent CXR & ABG Aspiration precautions Albuterol Neb PRN Will need to clarify re-intubation status with sister prior to extubation SBT / WUA as tolerated See ID  ID:  A: Aspiration Pneumonia Leukocytosis   P: Completed 7 days unasyn 7/31 Awaiting finalization of cultures Plan to re-culture for fever Trend PCT  CARDIOVASCULAR A:  SVT - resolved   P:  Continuing telemetry monitoring  Vitals per unit protocol Goal MAP >65  RENAL A:   Acute Renal Failure - Slow improvement. Hypernatremia - Approximately 4.3L free water deficit, improved Hypokalemia  Hypomagnesemia  Hypophosphatemia  Hyperchloremia - improving Metabolic Acidosis - secondary to hyperchloremia. L Hydronephrosis - S/P Perc Nephrostomy. H/O BPH  P:   Renal previously following, signed off 7/26 Rec's for continued gentle IVF's Not a  dialysis candidate per Nephrology IR following for perc nephrostomy care Monitoring UOP with Foley & Nephrostomy tube Trend electrolytes & renal function daily Change IVF to D51/2NS @ 50 mL/hr Increase Free Water to 400cc VT q4hr Replace electrolytes as indicated  Bethanechol VT tid   GI:  A: At Risk Aspiration  Dysphagia  Protein Calorie Malnutrition   P: NPO Continuing Tube Feedings Pt has living will that states no PEG tube Speech Eval if extubated Pepcid IV q24hr  HEMATOLOGY: A: Leukcytosis - Stable. Anemia - No signs of active bleeding.  P: Trending cell counts daily w/ CBC Transfuse for Hgb <7.0 or active bleeding SCDs  ENDOCRINE A: H/O DM - Glucose uncontrolled. Complicated by dextrose in IVF  P: NPH 20u [place] q8hr at noon Continue Accu-Checks q4hr SSI per Moderate Algorithm  NEUROLOGIC: A: Sedation on Ventilator  H/O Chronic Dementia S/P Tumor Resection - 20 years ago H/O Seizures  P: Desired RASS:  0 to -1 Fentanyl IV prn Pain Versed IV prn Sedation Supportive care AED:  Keppra & Lamictal Ativan IV prn Seizure Hold lactulose for diarrhea  Zoloft qday  FAMILY UPDATE: Patient's sister Darel Hong [facility]) updated by phone 7/29 by Dr. Jamison Neighbor.  No family at bedside am 7/31.  Will need to clarify if she would want him re-intubated prior to extubation.      Canary Brim, NP-C [place] [facility] Pgr: [phone] or if no answer [phone] 09/13/2015, 9:06 AM   Increased RR and HR with SBT.  Thick secretions.  More alert.  B/l crackles.  HR regular, tachy.  Abd soft.  WBC 20.4, Hb 7, Creatinine 2.03, Na 141.  Assessment/plan:  Acute respiratory failure with hypoxia from aspiration PNA. - wean as tolerated >> concerned respiratory secretions will limit ability to extubate - extubate when medically ready >> DNR/DNI after this - f/u CXR - repeat sputum culture 8/01  AKI. Hypernatremia. - continue IV fluid - decrease free water - not  candidate for HD due to chronic medical conditions  Sepsis from UTI with Lt nephrolithiasis and hydronephrosis. - continue nephrostomy tube - will need f/u with urology if he recovers from current illness  Hx of dementia after brain tumor resection. Hx of seizures. - RASS goal 0 to -1 - continue AEDs  Diarrhea. - hold lactulose for now  DM with hyperglycemia. - change to resistant SSI and lantus 25 units qhs  CC time by me independent of APP time 34 minutes.  Coralyn Helling, MD [facility] Pulmonary/Critical Care 09/13/2015, 10:10 AM Pager:  [phone] After 3pm call: [phone]

## 2015-09-13 NOTE — Progress Notes (Signed)
Sister Darel Hong called to update on patients clinical status.  He is making slow improvement and is successfully weaning on 8/5.  Discussed that he is nearing the point of extubation.  Given DNR status, we reviewed the concept of extubation and if he were to fail that re-intubation would not likely be in his best interest (overall debility, prolonged vent needs etc).  She understands this and is in agreement to "tune him up" to the best possible and proceed with extubation with no plan for reintubation if he were to fail.  If he were to fail extubation and become distressed we would communicate with Darel Hong and proceed toward comfort focused care.  Otherwise, continue medial care in the hopes of successful extubation.    Canary Brim, NP-C [place] [facility] Pgr: [phone] or if no answer [phone] 09/13/2015, 9:35 AM

## 2015-09-13 NOTE — Progress Notes (Signed)
Nutrition Follow-up  DOCUMENTATION CODES:   Non-severe (moderate) malnutrition in context of chronic illness  INTERVENTION:  - Continue Osmolite 1.5 @ 55 mL/hr with 30 mL Prostat once/day which is providing 2080 kcal, 98 grams of protein, and 1006 mL free water. - Continue PEPUP protocol. - Free water flush per MD/NP order (current order providing 600 mL). - RD will follow-up 8/3.  NUTRITION DIAGNOSIS:   Inadequate oral intake related to inability to eat as evidenced by NPO status. -ongoing  GOAL:   Patient will meet greater than or equal to 90% of their needs -met with current TF regimen.  MONITOR:   Vent status, TF tolerance, Weight trends, Labs, Skin, I & O's  ASSESSMENT:   55 y.o. 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 In ER, pt was tachycardic and febrile. WBC=17k. CXR shows right-sided pneumonia. Pt was transitioned to [place] long for further evaluation and recommendations given limitations of care at [place]. Urology is aware and critical care consulted. Patient will be admitted to step down for further care. Unable to obtain history from patient.  8/1 Pt continues with OGT. He is currently receiving TF at ordered goal rate: Osmolite 1.5 @ 55 mL/hr and 30 mL Prostat once/day. This regimen is providing 2080 kcal, 98 grams of protein, and 1006 mL free water. Free water flush per MD order: 200 mL every 8 hours which provides additional 600 mL free water. Estimated nutrition needs updated this AM based on current Ve, Tmax and admission weight (65 kg) as weight trending up since that time.   Patient is currently intubated on ventilator support MV: 14.6 L/min Temp (24hrs), Avg:99.7 F (37.6 C), Min:97.7 F (36.5 C), Max:100.5 F (38.1 C) Propofol: none  CCM NP note from this AM reviewed concerning vent weaning, no plan for re-intubation should pt be unable to successfully  extubate. RD will follow-up 8/3.  Medications reviewed; sliding scale Novolog, 30 units Lantus/day, 2 g IV Mg sulfate x1 dose yesterday, 40 mg Protonix/day, 300 mg Dilantin/day via OGT, 40 mEq KCl BID. Labs reviewed; CBGs: 293 and 328 mg/dL this AM, Cl: 117 mmol/L, BUN: 35 mg/dL, creatinine: 2.03 mg/dL, Ca: 7.4 mg/dL, GFR: 36 mL/min.   IVF: D5-1/2 NS @ 50 mL/hr (204 kcal).    7/31 Patient is currently intubated on ventilator support MV: 15.4 L/min Temp (24hrs), Max:102.2 F (39 C) Propofol: none   - Needs re-estimated, pt's fever increased. - Tolerating TF @ 53m/hr and increased to 55 mL/hr; Prostat once/day. - Weaning from vent currently.   7/30 Patient is currently intubated on ventilator support MV: 11.6 L/min Temp (24hrs), Max:100.8 F (38.2 C) Propofol: none  - Patient receiving Osmolite 1.5 @ goal rate of 55 and 300 ml H2O every 8 hours per MD/NP. - Pt's needs have increased, will adjust TF order to better meet needs.       Diet Order:  Diet NPO time specified  Skin:  Wound (see comment) (Stage 1 heel pressure injury, DTIs to bilateral buttocks)  Last BM:  7/31  Height:   Ht Readings from Last 1 Encounters:  09/08/15 6' 1"  (1.854 m)    Weight:   Wt Readings from Last 1 Encounters:  09/13/15 153 lb 7 oz (69.6 kg)    Ideal Body Weight:  83.64 kg (kg)  BMI:  Body mass index is 20.24 kg/m.  Estimated Nutritional Needs:   Kcal:  2094  Protein:  78-98 grams (1.2-1.5 grams/kg)  Fluid:  2L/day  EDUCATION NEEDS:   No education needs identified at this time    Jarome Matin, MS, RD, LDN Inpatient Clinical Dietitian Pager # [phone] After hours/weekend pager # [phone]

## 2015-09-14 ENCOUNTER — Inpatient Hospital Stay (HOSPITAL_COMMUNITY): Payer: Medicare Other

## 2015-09-14 LAB — CBC
HCT: 20.5 % — ABNORMAL LOW (ref 39.0–52.0)
Hemoglobin: 6.5 g/dL — CL (ref 13.0–17.0)
MCH: 24.6 pg — ABNORMAL LOW (ref 26.0–34.0)
MCHC: 31.7 g/dL (ref 30.0–36.0)
MCV: 77.7 fL — ABNORMAL LOW (ref 78.0–100.0)
PLATELETS: 156 10*3/uL (ref 150–400)
RBC: 2.64 MIL/uL — AB (ref 4.22–5.81)
RDW: 17 % — AB (ref 11.5–15.5)
WBC: 14.6 10*3/uL — AB (ref 4.0–10.5)

## 2015-09-14 LAB — GLUCOSE, CAPILLARY
GLUCOSE-CAPILLARY: 326 mg/dL — AB (ref 65–99)
GLUCOSE-CAPILLARY: 277 mg/dL — AB (ref 65–99)
Glucose-Capillary: 300 mg/dL — ABNORMAL HIGH (ref 65–99)
Glucose-Capillary: 307 mg/dL — ABNORMAL HIGH (ref 65–99)
Glucose-Capillary: 308 mg/dL — ABNORMAL HIGH (ref 65–99)
Glucose-Capillary: 309 mg/dL — ABNORMAL HIGH (ref 65–99)

## 2015-09-14 LAB — BASIC METABOLIC PANEL
ANION GAP: 5 (ref 5–15)
BUN: 35 mg/dL — AB (ref 6–20)
CALCIUM: 7.8 mg/dL — AB (ref 8.9–10.3)
CO2: 22 mmol/L (ref 22–32)
Chloride: 118 mmol/L — ABNORMAL HIGH (ref 101–111)
Creatinine, Ser: 1.87 mg/dL — ABNORMAL HIGH (ref 0.61–1.24)
GFR calc Af Amer: 46 mL/min — ABNORMAL LOW (ref >60)
GFR, EST NON AFRICAN AMERICAN: 39 mL/min — AB (ref >60)
Glucose, Bld: 353 mg/dL — ABNORMAL HIGH (ref 65–99)
POTASSIUM: 3.3 mmol/L — AB (ref 3.5–5.1)
SODIUM: 145 mmol/L (ref 135–145)

## 2015-09-14 LAB — PHOSPHORUS: Phosphorus: 2.4 mg/dL — ABNORMAL LOW (ref 2.5–4.6)

## 2015-09-14 LAB — ABO/RH: ABO/RH(D): A POS

## 2015-09-14 LAB — MAGNESIUM: MAGNESIUM: 1.9 mg/dL (ref 1.7–2.4)

## 2015-09-14 LAB — HEMOGLOBIN AND HEMATOCRIT, BLOOD
HCT: 24.5 % — ABNORMAL LOW (ref 39.0–52.0)
HEMOGLOBIN: 8 g/dL — AB (ref 13.0–17.0)

## 2015-09-14 LAB — PREPARE RBC (CROSSMATCH)

## 2015-09-14 MED ORDER — SODIUM CHLORIDE 0.9 % IV SOLN
Freq: Once | INTRAVENOUS | Status: AC
  Administered 2015-09-14: 12:00:00 via INTRAVENOUS

## 2015-09-14 MED ORDER — FREE WATER
200.0000 mL | Freq: Four times a day (QID) | Status: DC
  Administered 2015-09-14 – 2015-09-15 (×4): 200 mL

## 2015-09-14 MED ORDER — INSULIN GLARGINE 100 UNIT/ML ~~LOC~~ SOLN
35.0000 [IU] | Freq: Every day | SUBCUTANEOUS | Status: DC
  Administered 2015-09-14: 35 [IU] via SUBCUTANEOUS
  Filled 2015-09-14: qty 0.35

## 2015-09-14 MED ORDER — SODIUM CHLORIDE 0.9 % IV SOLN
INTRAVENOUS | Status: DC
  Administered 2015-09-14: 10:00:00 via INTRAVENOUS

## 2015-09-14 MED ORDER — DEXTROSE 5 % IV SOLN
2.0000 g | Freq: Two times a day (BID) | INTRAVENOUS | Status: DC
  Administered 2015-09-14 – 2015-09-15 (×3): 2 g via INTRAVENOUS
  Filled 2015-09-14 (×4): qty 2

## 2015-09-14 NOTE — Progress Notes (Signed)
Pharmacy Antibiotic Note  Cory Salazar is a 55 y.o. male admitted on 09/05/2015 with acute renal failure, sepsis, HCAP, UTI, hydronephrosis requiring nephrostomy (09/06/15).  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  He was initially started on broad spectrum antibiotics with Vancomycin and Zosyn, then narrowed to Unasyn for suspected aspiration event; completed tx on 8/1  Today, 09/14/2015 Pharmacy consulted to dose ceftazidime for Pseudomonas from sputum via ETT.  Plan:  Ceftazidime 2g IV q12h for CrCl 30-50 ml/min  Follow up renal fxn, culture/sensitivitiy results, and clinical course.  Height: 6\' 1"  (185.4 cm) Weight: 152 lb 5.4 oz (69.1 kg) IBW/kg (Calculated) : 79.9  Temp (24hrs), Avg:100.1 F (37.8 C), Min:99.3 F (37.4 C), Max:100.8 F (38.2 C)   Recent Labs Lab 09/10/15 0450 09/11/15 0425 09/12/15 0352 09/13/15 0440 09/14/15 0412  WBC 23.7* 24.0* 22.5* 20.5* 14.6*  CREATININE 2.98* 2.56* 2.37* 2.03* 1.87*    Estimated Creatinine Clearance: 44.7 mL/min (by C-G formula based on SCr of 1.87 mg/dL).    No Known Allergies  Antimicrobials this admission: 7/23 Vancomycin >> 7/25 7/23 Zosyn >> 7/25 7/25 Augmentin >> 7/26 7/26 Unasyn >> 8/1 8/1 Ceftazidime >>  Microbiology results: 7/23 BCx: NGTD 7/23 UCx: NGF  7/23 MRSA PCR: negative 7/24 CDiff antigen / toxin negative 7/24 GI panel negative 8/1 ETT: abundant pseudomonas (sens pending)  Thank you for allowing pharmacy to be a part of this patient's care.  Loralee Pacas, PharmD, BCPS Pager: [phone] 09/14/2015 1:37 PM

## 2015-09-14 NOTE — Progress Notes (Signed)
PULMONARY / CRITICAL CARE MEDICINE   Name: Cory Salazar MRN: [id number] DOB: 1960-05-23    ADMISSION DATE:  09/05/2015 CONSULTATION DATE:  09/05/15  REFERRING MD: Amado Coe, A  CHIEF COMPLAINT:  Acute Renal Failure  HISTORY: 55 yo male presented to [facility] on 7/24 with fever and AKI.  He has hx of dementia after tx for brain tumor in his 48's.  Found to have nephrolithiasis with Lt hydronephrosis and UTI.  Course complicated by VDRF and pneumonia.  Transferred to [facility] for IR placement of nephrostomy tube.  SUBJECTIVE:   Still has respiratory secretions.  VITAL SIGNS: BP 101/65   Pulse (!) 121   Temp (!) 100.8 F (38.2 C) (Oral) Comment: reported to rn   Resp (!) 32   Ht  (1.854 m)   Wt 152 lb 5.4 oz (69.1 kg)   SpO2 96%   BMI 20.10 kg/m   VENTILATOR SETTINGS: Vent Mode: PSV;CPAP FiO2 (%):  [40 %] 40 % Set Rate:  [18 bmp] 18 bmp Vt Set:  [640 mL] 640 mL PEEP:  [5 cmH20] 5 cmH20 Pressure Support:  [8 cmH20] 8 cmH20 Plateau Pressure:  [16 cmH20-20 cmH20] 16 cmH20  INTAKE / OUTPUT: I/O last 3 completed shifts: In: 5355 [I.V.:1810; Other:370; NG/GT:3175] Out: 4750 [Urine:4750]  PHYSICAL EXAMINATION: General: alert Neuro: RASS 0 HEENT: ETT in place Cardiac: regular Chest: faint b/l crackles Abd: soft, non tender Ext: no edema Skin: pressures sores   BMET  Recent Labs Lab 09/12/15 0352 09/12/15 1010 09/13/15 0440 09/14/15 0412  NA 149*  --  141 145  K 3.0* 3.3* 3.8 3.3*  CL 126*  --  117* 118*  CO2 20*  --  20* 22  BUN 41*  --  35* 35*  CREATININE 2.37*  --  2.03* 1.87*  GLUCOSE 303*  --  354* 353*    Electrolytes  Recent Labs Lab 09/11/15 1047 09/12/15 0352 09/13/15 0440 09/14/15 0412  CALCIUM  --  7.5* 7.4* 7.8*  MG 1.9 1.8  --  1.9  PHOS 2.6 2.5  --  2.4*    CBC  Recent Labs Lab 09/12/15 0352 09/13/15 0440 09/14/15 0412  WBC 22.5* 20.5* 14.6*  HGB 7.2* 7.0* 6.5*  HCT 22.6* 22.3* 20.5*  PLT 142* 141* 156    Coag's No results for  input(s): APTT, INR in the last 168 hours.  Sepsis Markers  Recent Labs Lab 09/09/15 0808 09/10/15 0450 09/11/15 0425  PROCALCITON 19.65 13.84 7.64    ABG  Recent Labs Lab 09/08/15 1230 09/08/15 1300 09/09/15 0630  PHART 7.232* 7.287* 7.388  PCO2ART 40.8 36.1 27.2*  PO2ART 64.9* 347* 96.5    Liver Enzymes  Recent Labs Lab 09/12/15 0352  ALBUMIN 1.7*    Cardiac Enzymes No results for input(s): TROPONINI, PROBNP in the last 168 hours.  Glucose  Recent Labs Lab 09/13/15 1206 09/13/15 1601 09/13/15 1946 09/14/15 0005 09/14/15 0415 09/14/15 0745  GLUCAP 326* 310* 278* 307* 326* 309*    Imaging Dg Chest Port 1 View  Result Date: 09/14/2015 CLINICAL DATA:  Respiratory failure, intubated patient, healthcare associated pneumonia, acute renal failure. EXAM: PORTABLE CHEST 1 VIEW COMPARISON:  Portable chest x-ray of September 12, 2015 FINDINGS: The lungs are well-expanded. There is stable alveolar opacity in the right lower lung. The left lung exhibits minimal interstitial prominence adjacent to the heart border which is stable. The heart and pulmonary vascularity are normal. The endotracheal tube tip projects 4.4 cm above the carina. The esophagogastric tube tip  in proximal port lie below the inferior margin of the image. The left subclavian venous catheter tip projects over the midportion of the SVC. IMPRESSION: Persistent basilar interstitial and alveolar infiltrates, stable. The support tubes are in reasonable position. Electronically Signed   By: David  [place] M.D.   On: 09/14/2015 07:03     STUDIES:  7/23 Renal u/s >> Lt hydronephrosis 7/24 CT abd/pelvis >> small superior uretal stone in with mild pelviectasis, additional non-obstructing stone on R  MICROBIOLOGY: Blood ([facility]) 7/23 >>  Sputum 8/01 >>  ANTIBIOTICS: Zosyn 7/24 - 7/25 Vancomycin 7/25 Augmentin 7/25 - 7/26 Unasyn 7/25 - 7/31  SIGNIFICANT EVENTS: 7/24 - Admit to ICU w/ R Pneumonia & Acute Renal  Failure on CRRT 7/25 - Perc nephrostomy tube placed 7/26 - Transferred to floor 7/27 - Transfer to ICU for acute hypoxic resp failure & increased work of breathing  LINES/TUBES: L Fem CVL 7/24 - 7/26 L Perc Nephrostomy Tube 7/25 >>  ETT 7.5 7/26 >> L Subclav CVL 7/27 >>  ASSESSMENT / PLAN:  PULMONARY A: Acute hypoxic respiratory failure 2nd to HCAP. P:   Pressure support wean as tolerated Respiratory secretions barrier to extubation trial >> continue scopalamine F/u CXR   CARDIOVASCULAR A:  SVT >> resolved. P:  Monitor hemodynamics  RENAL A:   AKI 2nd to sepsis, nephrolithiasis. Lt hydronephrosis s/p nephrostomy tube by IR. Hx of BPH. Hypernatremia. Hypokalemia. P:   Adjust free water F/u BMET Nephrostomy tube per IR Will need outpt f/u with urology Continue bethanechol  GASTROENTEROLOGY  A: Protein calorie malnutrition. P: Tube feeds while on vent Protonix for SUP  HEMATOLOGY: A: Anemia of critical illness and chronic disease. P: Transfuse 1 unit PRBC 8/02 F/u CBC SCDs  INFECTION A: Sepsis from UTI >> completed Abx. HCAP >> completed Abx. P: Follow up repeat sputum cx from 8/01 Monitor off Abx  ENDOCRINE A: DM type II. P: SSI with lantus D/c dextrose from IV fluid  NEUROLOGIC: A: Acute metabolic encephalopathy. Hx of dementia after brain tumor resection in his 36's. Hx of seizures. P: Desired RASS:  0 to -1 Continue lamictal, keppra, dilantin, zoloft  FAMILY UPDATE:  Updated pt's POA, Judy.  Explained he is not ready for extubation trial.  She is okay with transfusion if this will help him get off vent.  Otherwise confirmed plan of no escalation of care, DNR, and DNI after extubation.  CC time 34 minutes.  Coralyn Helling, MD [facility] Pulmonary/Critical Care 09/14/2015, 9:23 AM Pager:  [phone] After 3pm call: [phone]

## 2015-09-14 NOTE — Progress Notes (Signed)
Inpatient Diabetes Program Recommendations  AACE/ADA: New Consensus Statement on Inpatient Glycemic Control (2015)  Target Ranges:  Prepandial:   less than 140 mg/dL      Peak postprandial:   less than 180 mg/dL (1-2 hours)      Critically ill patients:  140 - 180 mg/dL   Lab Results  Component Value Date   GLUCAP 309 (H) 09/14/2015    Review of Glycemic Control  Results for EMANUELLE, FOLWELL (MRN [id number]) as of 09/14/2015 10:01  Ref. Range 09/13/2015 16:01 09/13/2015 19:46 09/14/2015 00:05 09/14/2015 04:15 09/14/2015 07:45  Glucose-Capillary Latest Ref Range: 65 - 99 mg/dL 267 (H) 124 (H) 580 (H) 326 (H) 309 (H)   For Best Practice, recommend ICU protocol for management of diabetes.  Inpatient Diabetes Program Recommendations:    Please consider ICU Glycemic Control Protocol for glucose management.  Thank you. Ailene Ards, RD, LDN, CDE Inpatient Diabetes Coordinator [phone]

## 2015-09-14 NOTE — Progress Notes (Signed)
Pseudomonas in sputum.  Has increased respiratory secretions and persistent infiltrate on CXR  Will add fortaz pending final culture sensitivities.  Coralyn Helling, MD [facility] Pulmonary/Critical Care 09/14/2015, 12:49 PM Pager:  [phone] After 3pm call: [phone]

## 2015-09-15 ENCOUNTER — Inpatient Hospital Stay (HOSPITAL_COMMUNITY): Payer: Medicare Other

## 2015-09-15 LAB — CBC
HCT: 23.4 % — ABNORMAL LOW (ref 39.0–52.0)
Hemoglobin: 7.7 g/dL — ABNORMAL LOW (ref 13.0–17.0)
MCH: 26.2 pg (ref 26.0–34.0)
MCHC: 32.9 g/dL (ref 30.0–36.0)
MCV: 79.6 fL (ref 78.0–100.0)
PLATELETS: 168 10*3/uL (ref 150–400)
RBC: 2.94 MIL/uL — ABNORMAL LOW (ref 4.22–5.81)
RDW: 16.6 % — ABNORMAL HIGH (ref 11.5–15.5)
WBC: 11.3 10*3/uL — ABNORMAL HIGH (ref 4.0–10.5)

## 2015-09-15 LAB — TYPE AND SCREEN
ABO/RH(D): A POS
ANTIBODY SCREEN: NEGATIVE
Unit division: 0

## 2015-09-15 LAB — GLUCOSE, CAPILLARY
GLUCOSE-CAPILLARY: 242 mg/dL — AB (ref 65–99)
GLUCOSE-CAPILLARY: 236 mg/dL — AB (ref 65–99)
GLUCOSE-CAPILLARY: 257 mg/dL — AB (ref 65–99)
GLUCOSE-CAPILLARY: 216 mg/dL — AB (ref 65–99)
Glucose-Capillary: 225 mg/dL — ABNORMAL HIGH (ref 65–99)
Glucose-Capillary: 307 mg/dL — ABNORMAL HIGH (ref 65–99)

## 2015-09-15 LAB — BASIC METABOLIC PANEL
Anion gap: 6 (ref 5–15)
BUN: 40 mg/dL — AB (ref 6–20)
CALCIUM: 8 mg/dL — AB (ref 8.9–10.3)
CO2: 24 mmol/L (ref 22–32)
CREATININE: 1.65 mg/dL — AB (ref 0.61–1.24)
Chloride: 117 mmol/L — ABNORMAL HIGH (ref 101–111)
GFR calc Af Amer: 53 mL/min — ABNORMAL LOW (ref >60)
GFR calc non Af Amer: 46 mL/min — ABNORMAL LOW (ref >60)
GLUCOSE: 243 mg/dL — AB (ref 65–99)
Potassium: 3.3 mmol/L — ABNORMAL LOW (ref 3.5–5.1)
Sodium: 147 mmol/L — ABNORMAL HIGH (ref 135–145)

## 2015-09-15 MED ORDER — INSULIN GLARGINE 100 UNIT/ML ~~LOC~~ SOLN
40.0000 [IU] | Freq: Every day | SUBCUTANEOUS | Status: DC
  Administered 2015-09-15 – 2015-09-17 (×3): 40 [IU] via SUBCUTANEOUS
  Filled 2015-09-15 (×3): qty 0.4

## 2015-09-15 MED ORDER — FREE WATER
250.0000 mL | Freq: Four times a day (QID) | Status: DC
  Administered 2015-09-15 – 2015-09-18 (×12): 250 mL

## 2015-09-15 MED ORDER — DEXTROSE 5 % IV SOLN
2.0000 g | Freq: Three times a day (TID) | INTRAVENOUS | Status: DC
  Administered 2015-09-15 – 2015-09-16 (×3): 2 g via INTRAVENOUS
  Filled 2015-09-15 (×4): qty 2

## 2015-09-15 MED ORDER — POTASSIUM CHLORIDE 20 MEQ/15ML (10%) PO SOLN
40.0000 meq | Freq: Once | ORAL | Status: AC
  Administered 2015-09-15: 40 meq
  Filled 2015-09-15: qty 30

## 2015-09-15 NOTE — Progress Notes (Signed)
PULMONARY / CRITICAL CARE MEDICINE   Name: Cory Salazar MRN: [id number] DOB: [birth year]    ADMISSION DATE:  09/05/2015 CONSULTATION DATE:  09/05/15  REFERRING MD: Amado Coe, A  CHIEF COMPLAINT:  Acute Renal Failure  HISTORY: 55 yo male presented to [facility] on 7/24 with fever and AKI.  He has hx of dementia after tx for brain tumor in his 21's.  Found to have nephrolithiasis with Lt hydronephrosis and UTI.  Course complicated by VDRF and pneumonia.  Transferred to [facility] for IR placement of nephrostomy tube.  SUBJECTIVE:   Failed PS wean  VITAL SIGNS: BP 127/62 (BP Location: Left Arm)   Pulse 93   Temp 99.9 F (37.7 C) (Axillary)   Resp (!) 26   Ht  (1.854 m)   Wt 146 lb 13.2 oz (66.6 kg)   SpO2 94%   BMI 19.37 kg/m   VENTILATOR SETTINGS: Vent Mode: PRVC FiO2 (%):  [30 %-40 %] 30 % Set Rate:  [18 bmp] 18 bmp Vt Set:  [640 mL] 640 mL PEEP:  [5 cmH20] 5 cmH20 Pressure Support:  [5 cmH20-8 cmH20] 5 cmH20 Plateau Pressure:  [14 cmH20-20 cmH20] 14 cmH20  INTAKE / OUTPUT: I/O last 3 completed shifts: In: 3082.3 [I.V.:922.3; Blood:335; Other:150; NG/GT:1575; IV Piggyback:100] Out: 4155 [Urine:4155]  PHYSICAL EXAMINATION: General: no follows commands Neuro: RASS 0 HEENT: ETT in place Cardiac: regular Chest: decreased bs bases Abd: soft, non tender, TF going Ext: no edema Skin: pressures sores   BMET  Recent Labs Lab 09/13/15 0440 09/14/15 0412 09/15/15 0346  NA 141 145 147*  K 3.8 3.3* 3.3*  CL 117* 118* 117*  CO2 20* 22 24  BUN 35* 35* 40*  CREATININE 2.03* 1.87* 1.65*  GLUCOSE 354* 353* 243*    Electrolytes  Recent Labs Lab 09/11/15 1047 09/12/15 0352 09/13/15 0440 09/14/15 0412 09/15/15 0346  CALCIUM  --  7.5* 7.4* 7.8* 8.0*  MG 1.9 1.8  --  1.9  --   PHOS 2.6 2.5  --  2.4*  --     CBC  Recent Labs Lab 09/13/15 0440 09/14/15 0412 09/14/15 1600 09/15/15 0346  WBC 20.5* 14.6*  --  11.3*  HGB 7.0* 6.5* 8.0* 7.7*  HCT 22.3* 20.5* 24.5*  23.4*  PLT 141* 156  --  168    Coag's No results for input(s): APTT, INR in the last 168 hours.  Sepsis Markers  Recent Labs Lab 09/09/15 0808 09/10/15 0450 09/11/15 0425  PROCALCITON 19.65 13.84 7.64    ABG  Recent Labs Lab 09/08/15 1230 09/08/15 1300 09/09/15 0630  PHART 7.232* 7.287* 7.388  PCO2ART 40.8 36.1 27.2*  PO2ART 64.9* 347* 96.5    Liver Enzymes  Recent Labs Lab 09/12/15 0352  ALBUMIN 1.7*    Cardiac Enzymes No results for input(s): TROPONINI, PROBNP in the last 168 hours.  Glucose  Recent Labs Lab 09/14/15 1209 09/14/15 1719 09/14/15 1943 09/14/15 2327 09/15/15 0350 09/15/15 0744  GLUCAP 277* 308* 300* 242* 225* 236*    Imaging Dg Chest Port 1 View  Result Date: 09/15/2015 CLINICAL DATA:  Acute respiratory failure, intubated patient, aspiration pneumonia, acute renal failure EXAM: PORTABLE CHEST 1 VIEW COMPARISON:  Portable chest x-ray of September 14, 2015 FINDINGS: The lungs are well-expanded. There is persistent interstitial and linear alveolar opacity in the mid and lower right lung. There is increased interstitial density in the left infrahilar region likely in the lower lobe as well. There is no significant pleural effusion. There is no pneumothorax.  The heart and pulmonary vascularity are normal. The endotracheal tube tip lies 3.6 cm above the carina. The esophagogastric tube tip projects below the inferior margin of the image. The left subclavian catheter tip projects over the midportion of the SVC. IMPRESSION: Slight interval worsening of bilateral lower lobe infiltrates consistent with pneumonia. The support tubes are in stable position. Electronically Signed   By: David  [place] M.D.   On: 09/15/2015 07:43     STUDIES:  7/23 Renal u/s >> Lt hydronephrosis 7/24 CT abd/pelvis >> small superior uretal stone in with mild pelviectasis, additional non-obstructing stone on R  MICROBIOLOGY: Blood ([facility]) 7/23 >>  Sputum 8/01  >>PA>>  ANTIBIOTICS: Zosyn 7/24 - 7/25 Vancomycin 7/25 Augmentin 7/25 - 7/26 Unasyn 7/25 - 7/31 Fortaz 8/2>>  SIGNIFICANT EVENTS: 7/24 - Admit to ICU w/ R Pneumonia & Acute Renal Failure on CRRT 7/25 - Perc nephrostomy tube placed 7/26 - Transferred to floor 7/27 - Transfer to ICU for acute hypoxic resp failure & increased work of breathing 8/3 failing ps weaning  LINES/TUBES: L Fem CVL 7/24 - 7/26 L Perc Nephrostomy Tube 7/25 >>  ETT 7.5 7/26 >> L Subclav CVL 7/27 >>  ASSESSMENT / PLAN:  PULMONARY A: Acute hypoxic respiratory failure 2nd to HCAP. P:   Pressure support wean as tolerated(8/3 failed ps) Respiratory secretions barrier to extubation trial >> continue scopalamine F/u CXR   CARDIOVASCULAR A:  SVT >> resolved. P:  Monitor hemodynamics  RENAL  Recent Labs Lab 09/13/15 0440 09/14/15 0412 09/15/15 0346  NA 141 145 147*   Lab Results  Component Value Date   CREATININE 1.65 (H) 09/15/2015   CREATININE 1.87 (H) 09/14/2015   CREATININE 2.03 (H) 09/13/2015    Recent Labs Lab 09/13/15 0440 09/14/15 0412 09/15/15 0346  K 3.8 3.3* 3.3*      A:   AKI 2nd to sepsis, nephrolithiasis. Improving Lt hydronephrosis s/p nephrostomy tube by IR. Hx of BPH. Hypernatremia. Hypokalemia. P:   Adjust free water, increased 8/3 F/u BMET Nephrostomy tube per IR Will need outpt f/u with urology Continue bethanechol Replete lytes as needed  GASTROENTEROLOGY  A: Protein calorie malnutrition. P: Tube feeds while on vent Protonix for SUP  HEMATOLOGY:  Recent Labs  09/14/15 1600 09/15/15 0346  HGB 8.0* 7.7*    A: Anemia of critical illness and chronic disease. P: Transfuse 1 unit PRBC 8/02 F/u CBC SCDs  INFECTION A: Sepsis from UTI >> completed Abx. HCAP >> pseudomonas , started on Fortaz 8/2 P: Follow up repeat sputum cx from 8/01 Monitor off Abx  ENDOCRINE CBG (last 3)   Recent Labs  09/14/15 2327 09/15/15 0350  09/15/15 0744  GLUCAP 242* 225* 236*     A: DM type II. P: SSI with lantus. Increased to 40 units 8/3 D/c dextrose from IV fluid  NEUROLOGIC: A: Acute metabolic encephalopathy. Hx of dementia after brain tumor resection in his 74's. Hx of seizures. P: Desired RASS:  0 to -1 Continue lamictal, keppra, dilantin, zoloft  FAMILY UPDATE:  Updated pt's POA, Judy.  Explained he is not ready for extubation trial.  She is okay with transfusion if this will help him get off vent.  Otherwise confirmed plan of no escalation of care, DNR, and DNI after extubation. 8/3 not weaning at this time. Reasonable to continue support thru weekend but may need terminal wean in near future.    Brett Canales Minor ACNP [facility] PCC[facility] Pager [phone] till 3 pm If no answer page [phone] 09/15/2015,  8:42 AM   Didn't tolerate SBT.  Still has increased respiratory secretions.  Alert.  B/l rhonchi.  Tachycardic/regular.  Abd soft.    Assessment/plan:  Acute respiratory failure. HCAP with Pseudomonas. - wean as able - continue fortaz  Anemia. - check iron levels  CC time by me independent of APP time 31 minutes.  Coralyn Helling, MD [facility] Pulmonary/Critical Care 09/15/2015, 10:07 AM Pager:  [phone] After 3pm call: [phone]

## 2015-09-15 NOTE — Progress Notes (Signed)
Pharmacy Antibiotic Note  Cory Salazar is a 55 y.o. male admitted on 09/05/2015 with acute renal failure, sepsis, HCAP, UTI, hydronephrosis requiring nephrostomy (09/06/15).  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  He was initially started on broad spectrum antibiotics with Vancomycin and Zosyn, then narrowed to Unasyn for suspected aspiration event; completed tx on 8/1.  On 8/2 Pharmacy consulted to dose ceftazidime for Pseudomonas from sputum via ETT.  Today, 09/15/2015 SCr improving, CrCl now 48 ml/min  Plan:  Increase Ceftazidime 2g IV q8h since CrCl  Is almost 50 ml/min  Follow up renal fxn, culture/sensitivitiy results, and clinical course.  Height: 6\' 1"  (185.4 cm) Weight: 146 lb 13.2 oz (66.6 kg) IBW/kg (Calculated) : 79.9  Temp (24hrs), Avg:99.8 F (37.7 C), Min:99 F (37.2 C), Max:100.8 F (38.2 C)   Recent Labs Lab 09/11/15 0425 09/12/15 0352 09/13/15 0440 09/14/15 0412 09/15/15 0346  WBC 24.0* 22.5* 20.5* 14.6* 11.3*  CREATININE 2.56* 2.37* 2.03* 1.87* 1.65*    Estimated Creatinine Clearance: 48.8 mL/min (by C-G formula based on SCr of 1.65 mg/dL).    No Known Allergies  Antimicrobials this admission: 7/23 Vancomycin >> 7/25 7/23 Zosyn >> 7/25 7/25 Augmentin >> 7/26 7/26 Unasyn >> 8/1 8/1 Ceftazidime >>  Microbiology results: 7/23 BCx: NGTD 7/23 UCx: NGF  7/23 MRSA PCR: negative 7/24 CDiff antigen / toxin negative 7/24 GI panel negative 8/1 ETT: abundant pseudomonas (sens pending)  Thank you for allowing pharmacy to be a part of this patient's care.  Loralee Pacas, PharmD, BCPS Pager: [phone] 09/15/2015 12:04 PM

## 2015-09-15 NOTE — Progress Notes (Signed)
Patient ID: Cory Salazar, male   DOB: Sep 21, 1960, 55 y.o.   MRN: [id number]    Referring Physician(s): Grapey,D  Supervising Physician: Malachy Moan  Patient Status:  Inpatient  Chief Complaint:  left hydronephrosis/stone/urosepsis  Subjective:  Intubated, not ready for extubation yet per CCM  Allergies: Review of patient's allergies indicates no known allergies.  Medications: Prior to Admission medications   Medication Sig Start Date End Date Taking? Authorizing Provider  acetaminophen (TYLENOL) 325 MG tablet Take 650 mg by mouth every 4 (four) hours as needed for mild pain, moderate pain or fever.   Yes Historical Provider, MD  insulin glargine (LANTUS) 100 UNIT/ML injection Inject 0.2 mLs (20 Units total) into the skin at bedtime. 09/05/15  Yes Ramonita Lab, MD  ipratropium-albuterol (DUONEB) 0.5-2.5 (3) MG/3ML SOLN Take 3 mLs by nebulization 4 (four) times daily. 09/05/15  Yes Ramonita Lab, MD  lactulose (CHRONULAC) 10 GM/15ML solution Take 30 mLs (20 g total) by mouth 2 (two) times daily. 09/05/15  Yes Ramonita Lab, MD  lamoTRIgine (LAMICTAL) 100 MG tablet Take 1 tablet (100 mg total) by mouth 2 (two) times daily. 09/05/15  Yes Ramonita Lab, MD  levETIRAcetam 500 mg in sodium chloride 0.9 % 100 mL Inject 500 mg into the vein every 12 (twelve) hours. 09/05/15  Yes Ramonita Lab, MD  LORazepam (ATIVAN) 2 MG/ML injection Inject 0.5 mLs (1 mg total) into the vein every 4 (four) hours as needed for seizure. 09/05/15  Yes Ramonita Lab, MD  mirtazapine (REMERON) 15 MG tablet Take 1 tablet (15 mg total) by mouth at bedtime. 09/05/15  Yes Ramonita Lab, MD  phenytoin (DILANTIN) 50 MG tablet Chew 6 tablets (300 mg total) by mouth at bedtime. 07/10/15 07/09/16 Yes Jeanmarie Plant, MD  piperacillin-tazobactam (ZOSYN) 3.375 GM/50ML IVPB Inject 50 mLs (3.375 g total) into the vein every 8 (eight) hours. 09/05/15  Yes Ramonita Lab, MD  potassium chloride SA (K-DUR,KLOR-CON) 20 MEQ tablet Take 20 mEq by mouth  2 (two) times daily.   Yes Historical Provider, MD  QUEtiapine (SEROQUEL) 200 MG tablet Take 200 mg by mouth 2 (two) times daily. **Medication on hold from 09/01/15 to 09/04/15 per MD at [facility]**   Yes Historical Provider, MD  sertraline (ZOLOFT) 50 MG tablet Take 50 mg by mouth daily.   Yes Historical Provider, MD  sodium chloride 0.45 % solution Inject 100 mLs into the vein continuous. 09/05/15  Yes Ramonita Lab, MD  sodium chloride flush (NS) 0.9 % SOLN Inject 3 mLs into the vein every 12 (twelve) hours. 09/05/15  Yes Ramonita Lab, MD  tamsulosin (FLOMAX) 0.4 MG CAPS capsule Take 0.4 mg by mouth daily.   Yes Historical Provider, MD     Vital Signs: BP 101/69   Pulse 93   Temp 100.3 F (37.9 C) (Oral)   Resp 18   Ht 6\' 1"  (1.854 m)   Wt 146 lb 13.2 oz (66.6 kg)   SpO2 97%   BMI 19.37 kg/m   Physical Exam intubated, eyes open; left PCN intact, dressing dry, output 725 cc yellow urine  Imaging: Dg Chest Port 1 View  Result Date: 09/15/2015 CLINICAL DATA:  Acute respiratory failure, intubated patient, aspiration pneumonia, acute renal failure EXAM: PORTABLE CHEST 1 VIEW COMPARISON:  Portable chest x-ray of September 14, 2015 FINDINGS: The lungs are well-expanded. There is persistent interstitial and linear alveolar opacity in the mid and lower right lung. There is increased interstitial density in the left infrahilar region likely in the  lower lobe as well. There is no significant pleural effusion. There is no pneumothorax. The heart and pulmonary vascularity are normal. The endotracheal tube tip lies 3.6 cm above the carina. The esophagogastric tube tip projects below the inferior margin of the image. The left subclavian catheter tip projects over the midportion of the SVC. IMPRESSION: Slight interval worsening of bilateral lower lobe infiltrates consistent with pneumonia. The support tubes are in stable position. Electronically Signed   By: David  [place] M.D.   On: 09/15/2015 07:43    Dg Chest Port 1 View  Result Date: 09/14/2015 CLINICAL DATA:  Respiratory failure, intubated patient, healthcare associated pneumonia, acute renal failure. EXAM: PORTABLE CHEST 1 VIEW COMPARISON:  Portable chest x-ray of September 12, 2015 FINDINGS: The lungs are well-expanded. There is stable alveolar opacity in the right lower lung. The left lung exhibits minimal interstitial prominence adjacent to the heart border which is stable. The heart and pulmonary vascularity are normal. The endotracheal tube tip projects 4.4 cm above the carina. The esophagogastric tube tip in proximal port lie below the inferior margin of the image. The left subclavian venous catheter tip projects over the midportion of the SVC. IMPRESSION: Persistent basilar interstitial and alveolar infiltrates, stable. The support tubes are in reasonable position. Electronically Signed   By: David  [place] M.D.   On: 09/14/2015 07:03   Dg Chest Port 1 View  Result Date: 09/12/2015 CLINICAL DATA:  Acute respiratory failure EXAM: PORTABLE CHEST 1 VIEW COMPARISON:  09/10/2015 FINDINGS: Endotracheal tube, nasogastric catheter left subclavian central line are again seen and stable. Patchy infiltrative changes are noted in the bases right greater than left increased from the prior exam. No effusion or pneumothorax is seen. No bony abnormality is noted. IMPRESSION: Increasing bibasilar infiltrates. Electronically Signed   By: Alcide Clever M.D.   On: 09/12/2015 12:55    Labs:  CBC:  Recent Labs  09/12/15 0352 09/13/15 0440 09/14/15 0412 09/14/15 1600 09/15/15 0346  WBC 22.5* 20.5* 14.6*  --  11.3*  HGB 7.2* 7.0* 6.5* 8.0* 7.7*  HCT 22.6* 22.3* 20.5* 24.5* 23.4*  PLT 142* 141* 156  --  168    COAGS:  Recent Labs  09/06/15 0337  INR 1.48    BMP:  Recent Labs  09/12/15 0352 09/12/15 1010 09/13/15 0440 09/14/15 0412 09/15/15 0346  NA 149*  --  141 145 147*  K 3.0* 3.3* 3.8 3.3* 3.3*  CL 126*  --  117* 118* 117*  CO2 20*   --  20* 22 24  GLUCOSE 303*  --  354* 353* 243*  BUN 41*  --  35* 35* 40*  CALCIUM 7.5*  --  7.4* 7.8* 8.0*  CREATININE 2.37*  --  2.03* 1.87* 1.65*  GFRNONAA 30*  --  36* 39* 46*  GFRAA 34*  --  41* 46* 53*    LIVER FUNCTION TESTS:  Recent Labs  07/10/15 1231 09/04/15 1102 09/05/15 0059 09/05/15 1006 09/05/15 1317 09/06/15 0337 09/12/15 0352  BILITOT 0.4 3.1* 3.5*  --   --  1.8*  --   AST 27 30 24   --   --  11*  --   ALT 23 20 17   --   --  15*  --   ALKPHOS 141* 175* 120  --   --  83  --   PROT 8.1 8.0 6.2*  --   --  6.0*  --   ALBUMIN 3.9 2.8* 2.2* 2.0* 2.0* 2.3* 1.7*    Assessment  and Plan: S/p left PCN 7/25(stone/hydro); temp 100.3; WBC 11.3(14.6); HGB 7.7(8.0), creat 1.65(1.87); GFR 46(39); resp cx's- pseudomonas- await sens; CXR with interval worsening of bilat infiltrates/PNA; cont PCN for now;further plans as outlined by urology depending upon recovery from this acute process; additional plans as per CCM   Electronically Signed: D. Jeananne Rama 09/15/2015, 3:08 PM   I spent a total of 15 minutes at the the patient's bedside AND on the patient's hospital floor or unit, greater than 50% of which was counseling/coordinating care for left perc nephrostomy

## 2015-09-15 NOTE — Progress Notes (Signed)
Nutrition Follow-up  DOCUMENTATION CODES:   Non-severe (moderate) malnutrition in context of chronic illness  INTERVENTION:  - Continue Osmolite 1.5 @ 55 mL/hr with 30 mL Prostat once/day.  - Continue PEPUP protocol. - Continue free water flush per MD order.  - RD will follow-up 8/7.  NUTRITION DIAGNOSIS:   Inadequate oral intake related to inability to eat as evidenced by NPO status. -ongoing  GOAL:   Patient will meet greater than or equal to 90% of their needs -met with current TF regimen  MONITOR:   Vent status, TF tolerance, Weight trends, Labs, Skin, I & O's  ASSESSMENT:   55 yo male presented to [facility] on 7/24 with fever and AKI.  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  Found to have nephrolithiasis with Lt hydronephrosis and UTI.  Course complicated by VDRF and pneumonia.  Transferred to [facility] for IR placement of nephrostomy tube. Pt has been difficult to wean from the vent. Does not desire PEG.  8/3 Pt continues with OGT and TF at goal rate: Osmolite 1.5 @ 55 mL/hr with 30 mL Prostat once/day and 250 mL free water every 6 hours. Free water flush per MD order. This regimen is providing 2080 kcal, 98 grams of protein, and 2006 mL free water. Estimated nutrition needs continue to be based on admission weight of 65 kg as pt has some mild/moderate edema.  Patient is currently intubated on ventilator support MV: 11.9 L/min Temp (24hrs), Avg:99.8 F (37.7 C), Min:99 F (37.2 C), Max:100.8 F (38.2 C)  Spoke with RN outside of pt's room. She states that pt is tolerating TF without issue. Noted MAP of 56 and BP of 83/50 at time of RD visit. RN reports that this is the lowest pt has been and that he was recently given Fentanyl. Will continue to monitor MAP and BP to ensure ongoing ability to feed pt.   Pt meeting needs at this time. RD will follow-up 8/7.  Medications reviewed; sliding scale Novolog, 40 units of Lantus/day, 500 mg Keppra BID, 40 mg  Protonix/day, 300 mg Dilantin per tube/day, 40 mEq KCl per tube x1 dose today.  Labs reviewed; CBGs: 225-236 mg/dL this AM, Na: 147 mmol/L, K: 3.3 mmol/L, Cl: 117 mmol/L, BUN: 40 mg/dL, creatinine: 1.65 mg/dL, Ca: 8 mg/dL, GFR: 46 mL/min, Phos: 2.4 mg/dL, Mg WDL.    8/1 Patient is currently intubated on ventilator support MV: 14.6 L/min Temp (24hrs), Avg:99.7 F (37.6 C), Max:100.5 F (38.1 C)  - He is currently receiving TF at ordered goal rate: Osmolite 1.5 @ 55 mL/hr and 30 mL Prostat once/day.  - Free water flush per MD order: 200 mL every 8 hours. - CCM NP note from this AM reviewed concerning vent weaning, no plan for re-intubation should pt be unable to successfully extubate.   IVF: D5-1/2 NS @ 50 mL/hr (204 kcal).   7/31 Patient is currently intubated on ventilator support MV: 15.4L/min Temp (24hrs), Max:102.2 F (39 C)  - Needs re-estimated, pt's fever increased. - Tolerating TF @ 25m/hr and increased to 55 mL/hr; Prostat once/day. - Weaning from vent currently.    Diet Order:  Diet NPO time specified  Skin:  Wound (see comment) (Stage 1 heel pressure injury, DTIs to bilateral buttocks)  Last BM:  8/1  Height:   Ht Readings from Last 1 Encounters:  09/08/15 _0  (1.854 m)    Weight:   Wt Readings from Last 1 Encounters:  09/15/15 146 lb  13.2 oz (66.6 kg)    Ideal Body Weight:  83.64 kg (kg)  BMI:  Body mass index is 19.37 kg/m.  Estimated Nutritional Needs:   Kcal:  2027  Protein:  78-98 grams (1.2-1.5 grams)  Fluid:  2L/day  EDUCATION NEEDS:   No education needs identified at this time    Jarome Matin, MS, RD, LDN Inpatient Clinical Dietitian Pager # [phone] After hours/weekend pager # [phone]

## 2015-09-15 NOTE — Progress Notes (Signed)
Pt was placed back on full support due to increase HR.

## 2015-09-16 ENCOUNTER — Inpatient Hospital Stay (HOSPITAL_COMMUNITY): Payer: Medicare Other

## 2015-09-16 DIAGNOSIS — J9600 Acute respiratory failure, unspecified whether with hypoxia or hypercapnia: Secondary | ICD-10-CM

## 2015-09-16 LAB — CBC
HEMATOCRIT: 23.5 % — AB (ref 39.0–52.0)
HEMOGLOBIN: 7.6 g/dL — AB (ref 13.0–17.0)
MCH: 26.4 pg (ref 26.0–34.0)
MCHC: 32.3 g/dL (ref 30.0–36.0)
MCV: 81.6 fL (ref 78.0–100.0)
Platelets: 214 10*3/uL (ref 150–400)
RBC: 2.88 MIL/uL — ABNORMAL LOW (ref 4.22–5.81)
RDW: 17.2 % — ABNORMAL HIGH (ref 11.5–15.5)
WBC: 11.1 10*3/uL — ABNORMAL HIGH (ref 4.0–10.5)

## 2015-09-16 LAB — BASIC METABOLIC PANEL
Anion gap: 5 (ref 5–15)
BUN: 40 mg/dL — AB (ref 6–20)
CHLORIDE: 120 mmol/L — AB (ref 101–111)
CO2: 26 mmol/L (ref 22–32)
CREATININE: 1.51 mg/dL — AB (ref 0.61–1.24)
Calcium: 8.2 mg/dL — ABNORMAL LOW (ref 8.9–10.3)
GFR calc Af Amer: 59 mL/min — ABNORMAL LOW (ref >60)
GFR calc non Af Amer: 51 mL/min — ABNORMAL LOW (ref >60)
Glucose, Bld: 93 mg/dL (ref 65–99)
Potassium: 3.4 mmol/L — ABNORMAL LOW (ref 3.5–5.1)
Sodium: 151 mmol/L — ABNORMAL HIGH (ref 135–145)

## 2015-09-16 LAB — GLUCOSE, CAPILLARY
GLUCOSE-CAPILLARY: 191 mg/dL — AB (ref 65–99)
GLUCOSE-CAPILLARY: 246 mg/dL — AB (ref 65–99)
Glucose-Capillary: 89 mg/dL (ref 65–99)
Glucose-Capillary: 149 mg/dL — ABNORMAL HIGH (ref 65–99)
Glucose-Capillary: 179 mg/dL — ABNORMAL HIGH (ref 65–99)

## 2015-09-16 LAB — CULTURE, RESPIRATORY

## 2015-09-16 LAB — IRON AND TIBC
Iron: 10 ug/dL — ABNORMAL LOW (ref 45–182)
SATURATION RATIOS: 6 % — AB (ref 17.9–39.5)
TIBC: 162 ug/dL — ABNORMAL LOW (ref 250–450)
UIBC: 152 ug/dL

## 2015-09-16 LAB — CULTURE, RESPIRATORY W GRAM STAIN

## 2015-09-16 LAB — MAGNESIUM: Magnesium: 2 mg/dL (ref 1.7–2.4)

## 2015-09-16 LAB — PHOSPHORUS: Phosphorus: 2.8 mg/dL (ref 2.5–4.6)

## 2015-09-16 LAB — FERRITIN: Ferritin: 256 ng/mL (ref 24–336)

## 2015-09-16 MED ORDER — SODIUM CHLORIDE 0.9 % IV SOLN
1.0000 g | Freq: Three times a day (TID) | INTRAVENOUS | Status: DC
Start: 2015-09-16 — End: 2015-09-24
  Administered 2015-09-16 – 2015-09-24 (×25): 1 g via INTRAVENOUS
  Filled 2015-09-16 (×27): qty 1

## 2015-09-16 MED ORDER — MIDAZOLAM HCL 2 MG/2ML IJ SOLN
2.0000 mg | Freq: Once | INTRAMUSCULAR | Status: AC
  Administered 2015-09-16: 2 mg via INTRAVENOUS

## 2015-09-16 MED ORDER — POTASSIUM CHLORIDE 20 MEQ/15ML (10%) PO SOLN
20.0000 meq | ORAL | Status: AC
  Administered 2015-09-16 (×2): 20 meq
  Filled 2015-09-16 (×2): qty 15

## 2015-09-16 MED ORDER — FENTANYL CITRATE (PF) 100 MCG/2ML IJ SOLN
200.0000 ug | Freq: Once | INTRAMUSCULAR | Status: AC
  Administered 2015-09-16: 200 ug via INTRAVENOUS

## 2015-09-16 NOTE — Progress Notes (Signed)
HCPOA Darel Hong) called and updated on patients status. Voiced concerns regarding pseudomonas pneumonia (sensitivities pending) and his limited weaning.  Reviewed that we are treated with antibiotics and are hopeful to support him through this illness.  However, also informed her that this only contributes to his weakness and debility.  Will continue current level of support and ongoing updates for sister.  Very concerned that he may not progress with weaning efforts.  D10 vent.     Canary Brim, NP-C [facility] Pgr: [phone] or if no answer [phone] 09/16/2015, 9:11 AM

## 2015-09-16 NOTE — Progress Notes (Signed)
Pharmacy Antibiotic Note  Cory Salazar is a 55 y.o. male admitted on 09/05/2015 who now has VDRF and pneumonia.  He is currently receiving ceftazidime, but sputum culture is growing Pseudomonas aeruginosa reported as I to ceftazidime and cefepime, S to ciprofloxacin, gentamicin, and imipenem.   Given patient's history of seizure disorder meropenem would be preferable over imipenem for carbapenem therapy.  Plan: After discussion with Canary Brim, NP, antibiotic regimen will be changed to meropenem 1 gram IV q8h with pharmacy dosing assistance.  Height: 6\' 1"  (185.4 cm) Weight: 146 lb 2.6 oz (66.3 kg) IBW/kg (Calculated) : 79.9  Temp (24hrs), Avg:99.8 F (37.7 C), Min:99 F (37.2 C), Max:100.6 F (38.1 C)   Recent Labs Lab 09/12/15 0352 09/13/15 0440 09/14/15 0412 09/15/15 0346 09/16/15 0450  WBC 22.5* 20.5* 14.6* 11.3* 11.1*  CREATININE 2.37* 2.03* 1.87* 1.65* 1.51*    Estimated Creatinine Clearance: 53.1 mL/min (by C-G formula based on SCr of 1.51 mg/dL).    No Known Allergies  Antimicrobials this admission: 7/23 Vancomycin >> 7/25 7/23 Zosyn >> 7/25 7/25 Augmentin >> 7/26 7/26 Unasyn >> 8/1 8/1 Ceftazidime >>8/4 8/4 Meropenem >>  Dose adjustments this admission: n/a  Microbiology results: 7/23 BCx: NGTD 7/23 UCx: NGF  7/23 MRSA PCR: negative 7/24 CDiff antigen / toxin negative 7/24 GI panel negative 8/1 ETT: abundant P.aeruginosa, I to ceftaz, cefepime;  S to ciproflox, gent, imipenem (using meropenem d/t patient's h/o seizure d/o)   Thank you for allowing pharmacy to be a part of this patient's care.  Elie Goody, PharmD, BCPS Pager: [phone] 09/16/2015  1:02 PM

## 2015-09-16 NOTE — Progress Notes (Signed)
[facility] ADULT ICU REPLACEMENT PROTOCOL FOR AM LAB REPLACEMENT ONLY  The patient does apply for the [facility] Adult ICU Electrolyte Replacment Protocol based on the criteria listed below:   1. Is GFR >/= 40 ml/min? Yes.    Patient's GFR today is 51 2. Is urine output >/= 0.5 ml/kg/hr for the last 6 hours? Yes.   Patient's UOP is 0.75 ml/kg/hr 3. Is BUN < 60 mg/dL? Yes.    Patient's BUN today is 40 4. Abnormal electrolyte(s):  K - 3.4 5. Ordered repletion with: PER PROTOCOL 6. If a panic level lab has been reported, has the CCM MD in charge been notified? Yes.  .   Physician:  Dr. Lorre Munroe 09/16/2015 5:38 AM

## 2015-09-16 NOTE — Progress Notes (Addendum)
Spoke with CCM Canary Brim about Et tube being difficult to advance, airway pressures being high and the patient having a continuous leak. Vital signs are stable. Paged Dr.Alva. Dr. Vassie Loll is coming to readjust ET tube

## 2015-09-16 NOTE — Progress Notes (Signed)
PULMONARY / CRITICAL CARE MEDICINE   Name: Cory Salazar MRN: [id number] DOB: 02-23-1960    ADMISSION DATE:  09/05/2015 CONSULTATION DATE:  09/05/15  REFERRING MD: Amado Coe, A  CHIEF COMPLAINT:  Acute Renal Failure  HISTORY: 55 yo male presented to [facility] on 7/24 with fever and AKI.  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  Found to have nephrolithiasis with Lt hydronephrosis and UTI.  Course complicated by VDRF and pneumonia.  Transferred to [facility] for IR placement of nephrostomy tube.  SUBJECTIVE:   Failed PS wean 8/3 ETT adjusted overnight due to leak Febrile , low grade Not on sedative gtt   VITAL SIGNS: BP 117/75   Pulse (!) 105   Temp 99 F (37.2 C) (Oral)   Resp 20   Ht 6\' 1"  (1.854 m)   Wt 146 lb 2.6 oz (66.3 kg)   SpO2 94%   BMI 19.28 kg/m   VENTILATOR SETTINGS: Vent Mode: PRVC FiO2 (%):  [30 %] 30 % Set Rate:  [18 bmp] 18 bmp Vt Set:  [640 mL] 640 mL PEEP:  [5 cmH20] 5 cmH20 Plateau Pressure:  [13 cmH20-25 cmH20] 15 cmH20  INTAKE / OUTPUT: I/O last 3 completed shifts: In: 2480 [I.V.:350; Other:40; NG/GT:1890; IV Piggyback:200] Out: 4050 [Urine:4000; Emesis/NG output:50]  PHYSICAL EXAMINATION: General: acutely ill, apearing Neuro: RASS 0 HEENT: ETT in place, jagged teeth Cardiac: regular Chest: decreased bs bases Abd: soft, non tender, TF going Ext: no edema Skin: pressures sores   BMET  Recent Labs Lab 09/14/15 0412 09/15/15 0346 09/16/15 0450  NA 145 147* 151*  K 3.3* 3.3* 3.4*  CL 118* 117* 120*  CO2 22 24 26   BUN 35* 40* 40*  CREATININE 1.87* 1.65* 1.51*  GLUCOSE 353* 243* 93    Electrolytes  Recent Labs Lab 09/12/15 0352  09/14/15 0412 09/15/15 0346 09/16/15 0450  CALCIUM 7.5*  < > 7.8* 8.0* 8.2*  MG 1.8  --  1.9  --  2.0  PHOS 2.5  --  2.4*  --  2.8  < > = values in this interval not displayed.  CBC  Recent Labs Lab 09/14/15 0412 09/14/15 1600 09/15/15 0346 09/16/15 0450  WBC 14.6*  --  11.3* 11.1*   HGB 6.5* 8.0* 7.7* 7.6*  HCT 20.5* 24.5* 23.4* 23.5*  PLT 156  --  168 214    Coag's No results for input(s): APTT, INR in the last 168 hours.  Sepsis Markers  Recent Labs Lab 09/09/15 0808 09/10/15 0450 09/11/15 0425  PROCALCITON 19.65 13.84 7.64    ABG No results for input(s): PHART, PCO2ART, PO2ART in the last 168 hours.  Liver Enzymes  Recent Labs Lab 09/12/15 0352  ALBUMIN 1.7*    Cardiac Enzymes No results for input(s): TROPONINI, PROBNP in the last 168 hours.  Glucose  Recent Labs Lab 09/14/15 2327 09/15/15 0350 09/15/15 0744 09/15/15 1304 09/15/15 1634 09/15/15 1939  GLUCAP 242* 225* 236* 307* 257* 216*    Imaging Dg Chest Port 1 View  Result Date: 09/16/2015 CLINICAL DATA:  Respiratory failure EXAM: PORTABLE CHEST 1 VIEW COMPARISON:  Yesterday FINDINGS: Tracheal extubation. A nasogastric tube is in good position. Left subclavian central line with tip at the distal SVC. History of pneumonia with stable bilateral lung opacities. No edema, effusion, or visible pneumothorax. Normal heart size. IMPRESSION: Stable inflation after extubation. History of pneumonia with stable bilateral opacity. Electronically Signed   By: Marnee Spring M.D.   On: 09/16/2015 07:06  STUDIES:  7/23 Renal u/s >> Lt hydronephrosis 7/24 CT abd/pelvis >> small superior uretal stone in with mild pelviectasis, additional non-obstructing stone on R  MICROBIOLOGY: Blood ([facility]) 7/23 >> ng Sputum 8/01 >>Pseudomonas >>  ANTIBIOTICS: Zosyn 7/24 - 7/25 Vancomycin 7/25 Augmentin 7/25 - 7/26 Unasyn 7/25 - 7/31 Fortaz 8/2>>  SIGNIFICANT EVENTS: 7/24 - Admit to ICU w/ R Pneumonia & Acute Renal Failure on CRRT 7/25 - Perc nephrostomy tube placed 7/26 - Transferred to floor 7/27 - Transfer to ICU for acute hypoxic resp failure & increased work of breathing 8/3 failing ps weaning  LINES/TUBES: L Fem CVL 7/24 - 7/26 L Perc Nephrostomy Tube 7/25 >>  ETT 7.5 7/26 >> L  Subclav CVL 7/27 >>  ASSESSMENT / PLAN:  PULMONARY A: Acute hypoxic respiratory failure 2nd to G neg  HCAP. P:   Pressure support wean as tolerated with goal once way extubation when meets criteria Respiratory secretions barrier to extubation trial >> continue scopalamine F/u CXR now for ETT position   CARDIOVASCULAR A:  SVT >> resolved. P:  Monitor hemodynamics  RENAL   A:   AKI 2nd to sepsis, nephrolithiasis. Improving Lt hydronephrosis s/p nephrostomy tube by IR. Hx of BPH. Hypernatremia. Hypokalemia. P:   Adjusted free water - trying to avoid D5W due to hyperglycemia F/u BMET Nephrostomy tube per IR Will need outpt f/u with urology Continue bethanechol Replete lytes as needed  GASTROENTEROLOGY  A: Protein calorie malnutrition. P: Tube feeds while on vent Protonix for SUP  HEMATOLOGY:  Recent Labs  09/15/15 0346 09/16/15 0450  HGB 7.7* 7.6*    A: Anemia of critical illness and chronic disease. S/p 1 unit PRBC 8/02 P:  F/u CBC SCDs  INFECTION A: Sepsis from UTI >> completed Abx. HCAP >> pseudomonas , started on Fortaz 8/2 P: Await sens on sputum cx   ENDOCRINE CBG (last 3)   Recent Labs  09/15/15 1304 09/15/15 1634 09/15/15 1939  GLUCAP 307* 257* 216*     A: DM type II. P: SSI with lantus. Increased to 40 units 8/3 May need D5W if na continues to rise  NEUROLOGIC: A: Acute metabolic encephalopathy. Hx of dementia after brain tumor resection in his 72's. Hx of seizures. P: Desired RASS:  0 to -1 Continue lamictal, keppra, dilantin, zoloft  FAMILY UPDATE: 8/3 Updated pt's POA, Judy.  Explained he is not ready for extubation trial.  She is okay with transfusion if this will help him get off vent.  Otherwise confirmed plan of no escalation of care, DNR, and DNI after extubation. Reasonable to continue support thru weekend , hopeful he will reach point of extubation   Cc time x 25m  Cyril Mourning MD. Tonny Bollman. [place]  [facility] Pager [phone] If no response call [phone]    09/16/2015, 7:58 AM

## 2015-09-16 NOTE — Progress Notes (Signed)
CSW continues to follow this pt admitted from [facility] PN viewed. Pt has been extubated. CSW will follow and assist with d/c planning as needed.  Cori Razor LCSW [phone]

## 2015-09-16 NOTE — Procedures (Signed)
Intubation Procedure Note Amer Edkin [id number] 01/30/1961  Procedure: Intubation Indications: Respiratory insufficiency , large cuff leak , losing TV Procedure Details Consent: Unable to obtain consent because of emergent medical necessity. Time Out: Verified patient identification, verified procedure, site/side was marked, verified correct patient position, special equipment/implants available, medications/allergies/relevent history reviewed, required imaging and test results available.  Performed  Maximum sterile technique was used including gloves, gown, hand hygiene and mask.  MAC and 4  Versed 2  Fent in divided doses Etomidate 20 Rocuronium 50  Cuff seen at larynx inlet on glidescope, withdrawn & new 7.5 inserted. Tv remained low hence changed over bougie to 8.0 ETT   Evaluation Hemodynamic Status: BP stable throughout; O2 sats: stable throughout Patient's Current Condition: stable Complications: No apparent complications Patient did tolerate procedure well. Chest X-ray ordered to verify placement.  CXR: pending.   Keyaira Clapham V. 09/16/2015

## 2015-09-17 ENCOUNTER — Inpatient Hospital Stay (HOSPITAL_COMMUNITY): Payer: Medicare Other

## 2015-09-17 DIAGNOSIS — N1339 Other hydronephrosis: Secondary | ICD-10-CM

## 2015-09-17 DIAGNOSIS — A4152 Sepsis due to Pseudomonas: Secondary | ICD-10-CM

## 2015-09-17 DIAGNOSIS — E440 Moderate protein-calorie malnutrition: Secondary | ICD-10-CM

## 2015-09-17 LAB — BASIC METABOLIC PANEL
Anion gap: 7 (ref 5–15)
BUN: 42 mg/dL — ABNORMAL HIGH (ref 6–20)
CALCIUM: 8.3 mg/dL — AB (ref 8.9–10.3)
CHLORIDE: 119 mmol/L — AB (ref 101–111)
CO2: 26 mmol/L (ref 22–32)
CREATININE: 1.56 mg/dL — AB (ref 0.61–1.24)
GFR calc Af Amer: 57 mL/min — ABNORMAL LOW (ref >60)
GFR calc non Af Amer: 49 mL/min — ABNORMAL LOW (ref >60)
GLUCOSE: 77 mg/dL (ref 65–99)
Potassium: 3.1 mmol/L — ABNORMAL LOW (ref 3.5–5.1)
Sodium: 152 mmol/L — ABNORMAL HIGH (ref 135–145)

## 2015-09-17 LAB — ALBUMIN: Albumin: 1.7 g/dL — ABNORMAL LOW (ref 3.5–5.0)

## 2015-09-17 LAB — GLUCOSE, CAPILLARY
GLUCOSE-CAPILLARY: 122 mg/dL — AB (ref 65–99)
GLUCOSE-CAPILLARY: 129 mg/dL — AB (ref 65–99)
GLUCOSE-CAPILLARY: 154 mg/dL — AB (ref 65–99)
GLUCOSE-CAPILLARY: 200 mg/dL — AB (ref 65–99)
GLUCOSE-CAPILLARY: 205 mg/dL — AB (ref 65–99)
GLUCOSE-CAPILLARY: 216 mg/dL — AB (ref 65–99)
GLUCOSE-CAPILLARY: 92 mg/dL (ref 65–99)
Glucose-Capillary: 81 mg/dL (ref 65–99)

## 2015-09-17 LAB — CBC
HCT: 24.6 % — ABNORMAL LOW (ref 39.0–52.0)
Hemoglobin: 7.7 g/dL — ABNORMAL LOW (ref 13.0–17.0)
MCH: 25.5 pg — ABNORMAL LOW (ref 26.0–34.0)
MCHC: 31.3 g/dL (ref 30.0–36.0)
MCV: 81.5 fL (ref 78.0–100.0)
PLATELETS: 272 10*3/uL (ref 150–400)
RBC: 3.02 MIL/uL — AB (ref 4.22–5.81)
RDW: 17.4 % — ABNORMAL HIGH (ref 11.5–15.5)
WBC: 11.6 10*3/uL — ABNORMAL HIGH (ref 4.0–10.5)

## 2015-09-17 LAB — PHENYTOIN LEVEL, TOTAL: PHENYTOIN LVL: 2.6 ug/mL — AB (ref 10.0–20.0)

## 2015-09-17 MED ORDER — POTASSIUM CHLORIDE 20 MEQ/15ML (10%) PO SOLN
20.0000 meq | ORAL | Status: AC
  Administered 2015-09-17 (×3): 20 meq
  Filled 2015-09-17 (×3): qty 15

## 2015-09-17 NOTE — Progress Notes (Signed)
Pharmacy Consult Note - Phenytoin Follow Up  Labs: phenytoin level 2.6  A/P: Patient currently on phenytoin 300mg  via tube qhs (note was on PO formulation prior to admission). Level currently subtherapeutic (goal 10-20) very likely because of TFs binding up phenytoin and reducing amount of drug absorbed into system. Recommend changing phenytoin back to 300mg  IV qhs while on tube feeds. Can consider giving 1g bolus as well.  Hessie Knows, PharmD, BCPS Pager [phone] 09/17/2015 10:16 AM

## 2015-09-17 NOTE — Progress Notes (Signed)
PULMONARY / CRITICAL CARE MEDICINE   Name: Cory Salazar MRN: [id number] DOB: [birth year]    ADMISSION DATE:  09/05/2015 CONSULTATION DATE:  09/05/15  REFERRING MD: Amado Coe, A  CHIEF COMPLAINT:  Acute Renal Failure  HISTORY: 55 yo male presented to [facility] on 7/24 with fever and AKI.  He has hx of dementia after tx for brain tumor in his 60's.  Found to have nephrolithiasis with Lt hydronephrosis and UTI.  Course complicated by VDRF and pneumonia.  Transferred to [facility] for IR placement of nephrostomy tube.  SUBJECTIVE:  No acute events overnight. Endotracheal tube changed yesterday due to cuff leak. Failed spontaneous breathing trial this morning.  REVIEW OF SYSTEMS:  Unable to obtain given intubation & baseline mental status.   VITAL SIGNS: BP 99/68   Pulse (!) 109   Temp (!) 100.4 F (38 C) (Axillary)   Resp 18   Ht  (1.854 m)   Wt 138 lb 7.2 oz (62.8 kg)   SpO2 99%   BMI 18.27 kg/m   VENTILATOR SETTINGS: Vent Mode: PRVC FiO2 (%):  [30 %-40 %] 40 % Set Rate:  [18 bmp] 18 bmp Vt Set:  [640 mL] 640 mL PEEP:  [5 cmH20] 5 cmH20 Pressure Support:  [8 cmH20] 8 cmH20 Plateau Pressure:  [16 cmH20-18 cmH20] 16 cmH20  INTAKE / OUTPUT: I/O last 3 completed shifts: In: 2735 [I.V.:360; Other:10; NG/GT:2015; IV Piggyback:350] Out: 4365 [Urine:4305; Emesis/NG output:60]  PHYSICAL EXAMINATION: General: No distress. Eyes open. No family at bedside. Neuro: Patient appears awake. Spontaneous movement of extremities. Not following commands although nurse reports patient is squeezing on command. Pupils symmetric. HEENT: Endotracheal tube in place. Moist mucous membranes. No scleral icterus. Cardiovascular: Tachycardic with regular rhythm. No JVD appreciated. Pulmonary: Coarse breath sounds bilaterally. Symmetric chest wall expansion on ventilator. Abdomen: Soft. Normal bowel sounds. Nondistended. Integument: Warm and dry. No rash on exposed skin.   BMET  Recent Labs Lab  09/15/15 0346 09/16/15 0450 09/17/15 0500  NA 147* 151* 152*  K 3.3* 3.4* 3.1*  CL 117* 120* 119*  CO2 BUN 40* 40* 42*  CREATININE 1.65* 1.51* 1.56*  GLUCOSE 243* 93 77    Electrolytes  Recent Labs Lab 09/12/15 0352  09/14/15 0412 09/15/15 0346 09/16/15 0450 09/17/15 0500  CALCIUM 7.5*  < > 7.8* 8.0* 8.2* 8.3*  MG 1.8  --  1.9  --  2.0  --   PHOS 2.5  --  2.4*  --  2.8  --   < > = values in this interval not displayed.  CBC  Recent Labs Lab 09/15/15 0346 09/16/15 0450 09/17/15 0500  WBC 11.3* 11.1* 11.6*  HGB 7.7* 7.6* 7.7*  HCT 23.4* 23.5* 24.6*  PLT 168 214 272    Coag's No results for input(s): APTT, INR in the last 168 hours.  Sepsis Markers  Recent Labs Lab 09/11/15 0425  PROCALCITON 7.64    ABG No results for input(s): PHART, PCO2ART, PO2ART in the last 168 hours.  Liver Enzymes  Recent Labs Lab 09/12/15 0352 09/17/15 0500  ALBUMIN 1.7* 1.7*    Cardiac Enzymes No results for input(s): TROPONINI, PROBNP in the last 168 hours.  Glucose  Recent Labs Lab 09/16/15 1547 09/16/15 2003 09/16/15 2334 09/17/15 0029 09/17/15 0513 09/17/15 0732  GLUCAP 179* 154* 122* 129* 81 92    Imaging Dg Abd 1 View  Result Date: 09/16/2015 CLINICAL DATA:  55 year old male with respiratory distress. Re intubated. Initial encounter. EXAM: ABDOMEN -  1 VIEW COMPARISON:  09/09/2015. Portable chest from today reported separately. FINDINGS: Portable AP supine view at 1404 hours. Enteric tube side hole at the distal thoracic esophagus level. Advance 8 cm to place this within the stomach. Stable left percutaneous nephrostomy tube. Gas-filled but nondilated bowel loops in the visible abdomen. IMPRESSION: 1. Enteric tube side hole of the distal thoracic esophagus, advance 8 cm to place the side hole within the stomach. 2. Increased abdominal bowel gas, might reflect ileus. 3. Left side percutaneous nephrostomy appear stable. Electronically Signed   By: Odessa Fleming M.D.   On: 09/16/2015 14:27   Dg Chest Port 1 View  Result Date: 09/17/2015 CLINICAL DATA:  Acute hypoxemic respiratory failure EXAM: PORTABLE CHEST 1 VIEW COMPARISON:  09/16/2015 FINDINGS: Endotracheal tube, NG tube, and LEFT central venous line are unchanged. Normal cardiac silhouette. Mild central venous congestion is improved. No pneumothorax. IMPRESSION: 1. Improvement in central venous congestion. 2. Stable support apparatus. Electronically Signed   By: Genevive Bi M.D.   On: 09/17/2015 07:21   Dg Chest Port 1 View  Result Date: 09/16/2015 CLINICAL DATA:  55 year old male with respiratory distress. Re intubated. Initial encounter. EXAM: PORTABLE CHEST 1 VIEW COMPARISON:  0819 hours today and earlier. FINDINGS: Portable AP supine view at 1359 hours. Endotracheal tube appears appropriately placed, tip just below the clavicles. Enteric tube in place, but side hole the level of the distal thoracic esophagus. Recommend advancing 8 cm to place the side hole within the stomach. Stable left subclavian central line. Mildly lower lung volumes. Continued perihilar and basilar reticulonodular opacity in both lungs. No pneumothorax or pleural effusion identified. Mediastinal contours remain within normal limits. Mild gaseous distension of bowel in the upper abdomen. IMPRESSION: 1. Endotracheal tube in good position. Enteric tube side hole at the distal thoracic esophagus, advanced 8 cm to place the side hole within the stomach. 2. Stable ventilation, bilateral reticulonodular opacity with top differential considerations of interstitial edema versus viral/atypical infection. Electronically Signed   By: Odessa Fleming M.D.   On: 09/16/2015 14:26     STUDIES:  Renal U/S 7/23: Lt hydronephrosis CT Abd/Pelvis 7/24: small superior uretal stone in with mild pelviectasis, additional non-obstructing stone on R Port CXR 8/5: No focal opacity or effusion appreciated. Endotracheal tube in good position. Left subclavian  central venous catheter in good position. Enteric feeding tube coursing below diaphragm.  MICROBIOLOGY: MRSA PCR 7/23:  Negative  Blood Ctx x2 7/23:  Negative Urine Ctx 7/23:  Negative  Tracheal Asp Ctx 8/1:  Pseudomonas aeruginosa   ANTIBIOTICS: Zosyn 7/24 - 7/25 Vancomycin 7/25 Augmentin 7/25 - 7/26 Unasyn 7/25 - 7/31 Fortaz 8/2 - 8/4 Merrem 8/4 >>  SIGNIFICANT EVENTS: 7/24 - Admit to ICU w/ R Pneumonia & Acute Renal Failure on CRRT 7/25 - Perc nephrostomy tube placed 7/26 - Transferred to floor 7/27 - Transfer to ICU for acute hypoxic resp failure & increased work of breathing 8/04 - ETT changed due to cuff leak  LINES/TUBES: L Fem CVL 7/24 - 7/26 L Perc Nephrostomy Tube 7/25 >>  ETT 7.5 7/26 >> L Subclav CVL 7/27 >>  ASSESSMENT / PLAN:  PULMONARY A: Acute Hypoxic Respiratory Failure Pseudomonas HCAP   P:   Continuing PS wean & SBT as tolerated Continuing Scopolamine Patch Likely one-way extubation  CARDIOVASCULAR A:  SVT - Resolved.  P:  Monitoring on telemetry Vitals per unit protocol  RENAL A:   Acute Renal Failure - Creatinine stabilizing. Left Hydronephrosis - S/P Perc Nephrostomy  tube by IR H/O BPH Hypernatremia - Improving. Hypokalemia - Replacing.  P:   Trending UOP with Foley & Nephrostomy Tube Monitoring daily electrolytes & renal function Free Water 250cc VT q6hr Avoiding D5W due to hyperglycemia Nephrostomy tube per IR Will need outpt f/u with Urology if he survives Bethanechol tid Replacing electrolytes as indicated KCl VT   GASTROENTEROLOGY  A: Protein calorie malnutrition.  P: Tube feeds while on vent Protonix VT daily  HEMATOLOGY: A: Anemia - Multifactorial. No active bleeding. S/P 1u PRBC 8/2. Leukocytosis - Mild. Likely due to sepsis.  P: Trending cell counts daily w/ CBC SCDs  INFECTION A: Sepsis  UTI - S/P Tx. Pseudomonas HCAP - Previously on [place] but intermediate sensitivity.  P: Merrem Day  #2 Plan to re-culture for fever  ENDOCRINE A: H/O DM Type II  P: Lantus 40u [place] qhs Accu-Checks q4hr SSI per Resistant Algorithm  NEUROLOGIC: A: Acute Encephalopathy - Likely metabolic. H/O Dementia - Following brain tumor resection in his 22's. H/O Seizure D/O  P: Desired RASS:  0 to -1 Fentanyl IV prn Pain Ativan IV prn Seizure Versed IV prn Sedation Continue lamictal, keppra, dilantin, zoloft  FAMILY UPDATE: HCPOA updated by BO on 8/4.   TODAY'S SUMMARY:  55 y.o. male with multiple medical problems. 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 Now intubated with Pseudomonas HCAP Versus tracheobronchitis. Continuing free water for treatment of hypernatremia. Plan for extubation as mental status and spontaneous breathing trials allow. Hopefully earlier this week.  I have spent a total of 31 minutes of critical care time today caring for the patient and reviewing the patient's electronic medical record.  Donna Christen Jamison Neighbor, M.D. [facility] Pager:  [phone] After 3pm or if no response, call [phone] 09/17/2015, 11:32 AM

## 2015-09-18 LAB — CBC WITH DIFFERENTIAL/PLATELET
BASOS ABS: 0 10*3/uL (ref 0.0–0.1)
Basophils Relative: 0 %
Eosinophils Absolute: 0.2 10*3/uL (ref 0.0–0.7)
Eosinophils Relative: 2 %
HCT: 23.5 % — ABNORMAL LOW (ref 39.0–52.0)
Hemoglobin: 7.1 g/dL — ABNORMAL LOW (ref 13.0–17.0)
LYMPHS ABS: 1.5 10*3/uL (ref 0.7–4.0)
Lymphocytes Relative: 19 %
MCH: 25.1 pg — AB (ref 26.0–34.0)
MCHC: 30.2 g/dL (ref 30.0–36.0)
MCV: 83 fL (ref 78.0–100.0)
MONO ABS: 0.6 10*3/uL (ref 0.1–1.0)
MONOS PCT: 7 %
Neutro Abs: 5.6 10*3/uL (ref 1.7–7.7)
Neutrophils Relative %: 72 %
PLATELETS: 243 10*3/uL (ref 150–400)
RBC: 2.83 MIL/uL — AB (ref 4.22–5.81)
RDW: 17.4 % — AB (ref 11.5–15.5)
WBC Morphology: INCREASED
WBC: 7.9 10*3/uL (ref 4.0–10.5)

## 2015-09-18 LAB — GLUCOSE, CAPILLARY
GLUCOSE-CAPILLARY: 189 mg/dL — AB (ref 65–99)
GLUCOSE-CAPILLARY: 161 mg/dL — AB (ref 65–99)
Glucose-Capillary: 125 mg/dL — ABNORMAL HIGH (ref 65–99)
Glucose-Capillary: 188 mg/dL — ABNORMAL HIGH (ref 65–99)
Glucose-Capillary: 207 mg/dL — ABNORMAL HIGH (ref 65–99)
Glucose-Capillary: 266 mg/dL — ABNORMAL HIGH (ref 65–99)

## 2015-09-18 LAB — MAGNESIUM: Magnesium: 2.3 mg/dL (ref 1.7–2.4)

## 2015-09-18 LAB — RENAL FUNCTION PANEL
ALBUMIN: 1.8 g/dL — AB (ref 3.5–5.0)
Anion gap: 7 (ref 5–15)
BUN: 40 mg/dL — AB (ref 6–20)
CHLORIDE: 123 mmol/L — AB (ref 101–111)
CO2: 27 mmol/L (ref 22–32)
Calcium: 8.5 mg/dL — ABNORMAL LOW (ref 8.9–10.3)
Creatinine, Ser: 1.5 mg/dL — ABNORMAL HIGH (ref 0.61–1.24)
GFR calc Af Amer: 60 mL/min — ABNORMAL LOW (ref >60)
GFR calc non Af Amer: 51 mL/min — ABNORMAL LOW (ref >60)
GLUCOSE: 225 mg/dL — AB (ref 65–99)
POTASSIUM: 3.9 mmol/L (ref 3.5–5.1)
Phosphorus: 2.3 mg/dL — ABNORMAL LOW (ref 2.5–4.6)
Sodium: 157 mmol/L — ABNORMAL HIGH (ref 135–145)

## 2015-09-18 MED ORDER — INSULIN NPH (HUMAN) (ISOPHANE) 100 UNIT/ML ~~LOC~~ SUSP
25.0000 [IU] | Freq: Two times a day (BID) | SUBCUTANEOUS | Status: DC
  Administered 2015-09-18: 25 [IU] via SUBCUTANEOUS

## 2015-09-18 MED ORDER — INSULIN NPH (HUMAN) (ISOPHANE) 100 UNIT/ML ~~LOC~~ SUSP
10.0000 [IU] | Freq: Once | SUBCUTANEOUS | Status: AC
  Administered 2015-09-18: 10 [IU] via SUBCUTANEOUS
  Filled 2015-09-18: qty 10

## 2015-09-18 MED ORDER — FREE WATER
400.0000 mL | Status: DC
Start: 2015-09-18 — End: 2015-09-19
  Administered 2015-09-18 – 2015-09-19 (×5): 400 mL

## 2015-09-18 NOTE — Progress Notes (Signed)
PULMONARY / CRITICAL CARE MEDICINE   Name: Cory Salazar MRN: [id number] DOB: 1961-01-14    ADMISSION DATE:  09/05/2015 CONSULTATION DATE:  09/05/15  REFERRING MD: Cory Salazar, A  CHIEF COMPLAINT:  Acute Renal Failure  HISTORY: 55 yo male [facility] with fever and AKI.  He has hx of dementia after tx for brain tumor in his 630's.  Found to have nephrolithiasis with Lt hydronephrosis and UTI.  Course complicated by VDRF and pneumonia.  [facility] IR placement of nephrostomy tube.  SUBJECTIVE:  No acute events overnight. Patient not consistently sleeping for any period of time per nurse.  REVIEW OF SYSTEMS:  Unable to obtain given intubation & baseline mental status.   VITAL SIGNS: BP 119/72   Pulse (!) 115   Temp 100.1 F (37.8 C) (Axillary)   Resp (!) 26   Ht 6\' 1"  (1.854 m)   Wt 132 lb 15 oz (60.3 kg)   SpO2 99%   BMI 17.54 kg/m   VENTILATOR SETTINGS: Vent Mode: PRVC FiO2 (%):  [40 %] 40 % Set Rate:  [18 bmp] 18 bmp Vt Set:  [640 mL] 640 mL PEEP:  [5 cmH20] 5 cmH20 Pressure Support:  [8 cmH20] 8 cmH20 Plateau Pressure:  [17 cmH20-19 cmH20] 17 cmH20  INTAKE / OUTPUT: I/O last 3 completed shifts: In: 2725 [I.V.:290; Other:30; NG/GT:1905; IV Piggyback:500] Out: 4005 [Urine:3995; Emesis/NG output:10]  PHYSICAL EXAMINATION: General: No distress. Eyes open. No family at bedside. Neuro: Awake. Giving thumbs up appropriately bilaterally on command. Moving all 4 extremities equally. HEENT: Endotracheal tube in place. No scleral icterus. Pupils symmetric.  Cardiovascular: Tachycardic with regular rhythm. No JVD appreciated. Sinus tachycardia on telemetry. Pulmonary: Slight improvement in bilateral breath sounds. Symmetric chest wall expansion on ventilator. Abdomen: Soft. Normal bowel sounds. Nondistended. Integument: Warm and dry. No rash on exposed skin.   BMET  Recent Labs Lab 09/16/15 0450 09/17/15 0500 09/18/15 0535  NA 151* 152* 157*  K  3.4* 3.1* 3.9  CL 120* 119* 123*  CO2 26 26 27   BUN 40* 42* 40*  CREATININE 1.51* 1.56* 1.50*  GLUCOSE 93 77 225*    Electrolytes  Recent Labs Lab 09/14/15 0412  09/16/15 0450 09/17/15 0500 09/18/15 0535  CALCIUM 7.8*  < > 8.2* 8.3* 8.5*  MG 1.9  --  2.0  --  2.3  PHOS 2.4*  --  2.8  --  2.3*  < > = values in this interval not displayed.  CBC  Recent Labs Lab 09/16/15 0450 09/17/15 0500 09/18/15 0535  WBC 11.1* 11.6* 7.9  HGB 7.6* 7.7* 7.1*  HCT 23.5* 24.6* 23.5*  PLT 214 272 243    Coag's No results for input(s): APTT, INR in the last 168 hours.  Sepsis Markers No results for input(s): LATICACIDVEN, PROCALCITON, O2SATVEN in the last 168 hours.  ABG No results for input(s): PHART, PCO2ART, PO2ART in the last 168 hours.  Liver Enzymes  Recent Labs Lab 09/12/15 0352 09/17/15 0500 09/18/15 0535  ALBUMIN 1.7* 1.7* 1.8*    Cardiac Enzymes No results for input(s): TROPONINI, PROBNP in the last 168 hours.  Glucose  Recent Labs Lab 09/17/15 0732 09/17/15 1145 09/17/15 1552 09/17/15 2034 09/17/15 2349 09/18/15 0509  GLUCAP 92 216* 205* 200* 188* 207*    Imaging No results found.   STUDIES:  Renal U/S 7/23: Lt hydronephrosis CT Abd/Pelvis 7/24: small superior uretal stone in with mild pelviectasis, additional non-obstructing stone on R Port CXR 8/5: No focal  opacity or effusion appreciated. Endotracheal tube in good position. Left subclavian central venous catheter in good position. Enteric feeding tube coursing below diaphragm.  MICROBIOLOGY: MRSA PCR 7/23:  Negative  Blood Ctx x2 7/23:  Negative Urine Ctx 7/23:  Negative  Tracheal Asp Ctx 8/1:  Pseudomonas aeruginosa   ANTIBIOTICS: Zosyn 7/24 - 7/25 Vancomycin 7/25 Augmentin 7/25 - 7/26 Unasyn 7/25 - 7/31 Fortaz 8/2 - 8/4 Merrem 8/4 >>  SIGNIFICANT EVENTS: 7/24 - Admit to ICU w/ R Pneumonia & Acute Renal Failure on CRRT 7/25 - Perc nephrostomy tube placed 7/26 - Transferred to  floor 7/27 - Transfer to ICU for acute hypoxic resp failure & increased work of breathing 8/04 - ETT changed due to cuff leak  LINES/TUBES: L Fem CVL 7/24 - 7/26 L Perc Nephrostomy Tube 7/25 >>  ETT 7.5 7/26 >> L Subclav CVL 7/27 >>  ASSESSMENT / PLAN:  PULMONARY A: Acute Hypoxic Respiratory Failure Pseudomonas HCAP   P:   Continuing PS wean & SBT as tolerated Continuing Scopolamine Patch Likely one-way extubation  CARDIOVASCULAR A: Sinus Tachycardia  P:  Monitoring on telemetry Vitals per unit protocol  RENAL A:   Acute Renal Failure - Creatinine stabilizing. Left Hydronephrosis - S/P Perc Nephrostomy tube by IR H/O BPH Hypernatremia - Worsening. Free Water Deficit 4L Hyperchloremia - Worsening. Hypokalemia - Resolved.  P:   Trending UOP with Foley & Nephrostomy Tube Monitoring daily electrolytes & renal function Increase Free Water 400 VT q4hr Avoiding D5W due to hyperglycemia Nephrostomy tube per IR Will need outpt f/u with Urology if he survives Bethanechol tid Replacing electrolytes as indicated  GASTROENTEROLOGY  A: Protein calorie malnutrition.  P: Tube feeds while on vent Protonix VT daily  HEMATOLOGY: A: Anemia - Multifactorial. No active bleeding. S/P 1u PRBC 8/2. Leukocytosis - Resolved. Likely due to sepsis.  P: Trending cell counts daily w/ CBC SCDs  INFECTION A: Sepsis  UTI - S/P Tx. Pseudomonas HCAP - Previously on [place] but intermediate sensitivity.  P: Merrem Day #3 Plan to re-culture for fever  ENDOCRINE A: H/O DM Type II - Glucose uncontrolled.  P: D/C [place] Start NPH 25u [place] q12hr @ 1800 today NPH 10u [place] x1 now Accu-Checks q4hr SSI per Resistant Algorithm  NEUROLOGIC: A: Acute Encephalopathy - Multifactorial w/ component of toxic metabolic. H/O Dementia - Following brain tumor resection in his 24's. H/O Seizure D/O  P: Desired RASS:  0 to -1 Fentanyl IV prn Pain Ativan IV prn Seizure Versed IV prn  Sedation Continue lamictal, keppra, dilantin, zoloft Seizure Precautions  FAMILY UPDATE: HCPOA updated by BO on 8/4. Attempted to call Cory Salazar 8/6 but left message.  TODAY'S SUMMARY:  55 y.o. male with multiple medical problems. 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 Now intubated with Pseudomonas HCAP Versus tracheobronchitis. Increasing free water for 4 L deficit. Mental status continuing to improve. Continuing daily spontaneous breathing trials with hope of extubation early this week. Attempted to contact healthcare power of attorney but unsuccessful. Switching to NPH for better titration of subcutaneous insulin and control of hyperglycemia.  I have spent a total of 32 minutes of critical care time today caring for the patient and reviewing the patient's electronic medical record.  Donna Christen Jamison Neighbor, M.D. [facility] Pulmonary & Critical Care Pager:  [phone] After 3pm or if no response, call [phone] 09/18/2015, 7:31 AM

## 2015-09-19 ENCOUNTER — Inpatient Hospital Stay (HOSPITAL_COMMUNITY): Payer: Medicare Other

## 2015-09-19 LAB — CBC WITH DIFFERENTIAL/PLATELET
BASOS PCT: 0 %
Basophils Absolute: 0 10*3/uL (ref 0.0–0.1)
EOS ABS: 0.2 10*3/uL (ref 0.0–0.7)
Eosinophils Relative: 3 %
HEMATOCRIT: 21.9 % — AB (ref 39.0–52.0)
HEMOGLOBIN: 6.7 g/dL — AB (ref 13.0–17.0)
LYMPHS ABS: 1.5 10*3/uL (ref 0.7–4.0)
Lymphocytes Relative: 22 %
MCH: 25.7 pg — ABNORMAL LOW (ref 26.0–34.0)
MCHC: 30.6 g/dL (ref 30.0–36.0)
MCV: 83.9 fL (ref 78.0–100.0)
MONOS PCT: 7 %
Monocytes Absolute: 0.5 10*3/uL (ref 0.1–1.0)
NEUTROS ABS: 4.7 10*3/uL (ref 1.7–7.7)
NEUTROS PCT: 69 %
Platelets: 227 10*3/uL (ref 150–400)
RBC: 2.61 MIL/uL — AB (ref 4.22–5.81)
RDW: 17.7 % — ABNORMAL HIGH (ref 11.5–15.5)
WBC: 6.9 10*3/uL (ref 4.0–10.5)

## 2015-09-19 LAB — RENAL FUNCTION PANEL
ANION GAP: 3 — AB (ref 5–15)
Albumin: 1.7 g/dL — ABNORMAL LOW (ref 3.5–5.0)
BUN: 35 mg/dL — ABNORMAL HIGH (ref 6–20)
CO2: 28 mmol/L (ref 22–32)
Calcium: 8.1 mg/dL — ABNORMAL LOW (ref 8.9–10.3)
Chloride: 117 mmol/L — ABNORMAL HIGH (ref 101–111)
Creatinine, Ser: 1.15 mg/dL (ref 0.61–1.24)
GFR calc non Af Amer: >60 mL/min (ref >60)
GLUCOSE: 94 mg/dL (ref 65–99)
PHOSPHORUS: 3.3 mg/dL (ref 2.5–4.6)
POTASSIUM: 3.5 mmol/L (ref 3.5–5.1)
Sodium: 148 mmol/L — ABNORMAL HIGH (ref 135–145)

## 2015-09-19 LAB — GLUCOSE, CAPILLARY
GLUCOSE-CAPILLARY: 110 mg/dL — AB (ref 65–99)
GLUCOSE-CAPILLARY: 157 mg/dL — AB (ref 65–99)
GLUCOSE-CAPILLARY: 142 mg/dL — AB (ref 65–99)
GLUCOSE-CAPILLARY: 55 mg/dL — AB (ref 65–99)
GLUCOSE-CAPILLARY: 78 mg/dL (ref 65–99)
GLUCOSE-CAPILLARY: 82 mg/dL (ref 65–99)
Glucose-Capillary: 115 mg/dL — ABNORMAL HIGH (ref 65–99)
Glucose-Capillary: 116 mg/dL — ABNORMAL HIGH (ref 65–99)
Glucose-Capillary: 48 mg/dL — ABNORMAL LOW (ref 65–99)

## 2015-09-19 LAB — PREPARE RBC (CROSSMATCH)

## 2015-09-19 LAB — MAGNESIUM: Magnesium: 2.2 mg/dL (ref 1.7–2.4)

## 2015-09-19 MED ORDER — DEXTROSE 50 % IV SOLN
25.0000 mL | Freq: Once | INTRAVENOUS | Status: AC
  Administered 2015-09-19: 25 mL via INTRAVENOUS

## 2015-09-19 MED ORDER — DEXTROSE 50 % IV SOLN
INTRAVENOUS | Status: AC
  Filled 2015-09-19: qty 50

## 2015-09-19 MED ORDER — SODIUM CHLORIDE 0.9 % IV SOLN
1000.0000 mg | Freq: Once | INTRAVENOUS | Status: AC
  Administered 2015-09-19: 1000 mg via INTRAVENOUS
  Filled 2015-09-19: qty 20

## 2015-09-19 MED ORDER — PHENYTOIN SODIUM 50 MG/ML IJ SOLN: 300.0000 mg | Freq: Every day | INTRAMUSCULAR | Status: DC

## 2015-09-19 MED ORDER — DEXTROSE-NACL 5-0.45 % IV SOLN
INTRAVENOUS | Status: DC
  Administered 2015-09-19: 13:00:00 via INTRAVENOUS

## 2015-09-19 MED ORDER — SODIUM CHLORIDE 0.9 % IV SOLN
500.0000 mg | Freq: Two times a day (BID) | INTRAVENOUS | Status: DC
  Administered 2015-09-19 – 2015-09-20 (×2): 500 mg via INTRAVENOUS
  Filled 2015-09-19 (×2): qty 5

## 2015-09-19 MED ORDER — DEXTROSE 50 % IV SOLN: 1.0000 | Freq: Once | INTRAVENOUS | Status: DC

## 2015-09-19 MED ORDER — SODIUM CHLORIDE 0.9 % IV SOLN: Freq: Once | INTRAVENOUS | Status: DC

## 2015-09-19 NOTE — Progress Notes (Signed)
PULMONARY / CRITICAL CARE MEDICINE   Name: Cory Salazar MRN: [id number] DOB: 1961/02/05    ADMISSION DATE:  09/05/2015 CONSULTATION DATE:  09/05/15  REFERRING MD: Amado Coe, A  CHIEF COMPLAINT:  Acute Renal Failure  HISTORY: 55 yo male presented to [facility] on 7/24 with fever and AKI.  He has hx of dementia after tx for brain tumor in his 18's.  Found to have nephrolithiasis with Lt hydronephrosis and UTI.  Course complicated by VDRF and pneumonia.  Transferred to [facility] for IR placement of nephrostomy tube.  SUBJECTIVE:  Extubated. Cuff blown from ETT  REVIEW OF SYSTEMS:  Unable to obtain given intubation & baseline mental status.   VITAL SIGNS: BP 102/71 (BP Location: Right Arm)   Pulse 93   Temp 97.8 F (36.6 C) (Axillary)   Resp 16   Ht  (1.854 m)   Wt 135 lb 9.3 oz (61.5 kg)   SpO2 100%   BMI 17.89 kg/m   INTAKE / OUTPUT: I/O last 3 completed shifts: In: 2165 [I.V.:160; Other:20; WU/JW:1191; IV Piggyback:500] Out: 4782 [NFAOZ:3086; Stool:1]  PHYSICAL EXAMINATION: General: No distress. Eyes open. Comfortable  Neuro: Awake. Follows commands bilaterally. Moving all 4 extremities equally. HEENT: NCAT. No scleral icterus. Pupils symmetric.  Cardiovascular: Tachycardic with regular rhythm. No JVD appreciated. Sinus tachycardia on telemetry. Pulmonary: scattered rhonchi Abdomen: Soft. Normal bowel sounds. Nondistended. Integument: Warm and dry. No rash on exposed skin.   BMET  Recent Labs Lab 09/17/15 0500 09/18/15 0535 09/19/15 0507  NA 152* 157* 148*  K 3.1* 3.9 3.5  CL 119* 123* 117*  CO2 BUN 42* 40* 35*  CREATININE 1.56* 1.50* 1.15  GLUCOSE 77 225* 94    Electrolytes  Recent Labs Lab 09/16/15 0450 09/17/15 0500 09/18/15 0535 09/19/15 0507  CALCIUM 8.2* 8.3* 8.5* 8.1*  MG 2.0  --  2.3 2.2  PHOS 2.8  --  2.3* 3.3    CBC  Recent Labs Lab 09/17/15 0500 09/18/15 0535 09/19/15 0507  WBC 11.6* 7.9 6.9  HGB 7.7* 7.1* 6.7*  HCT 24.6*  23.5* 21.9*  PLT 272 243 227    Coag's No results for input(s): APTT, INR in the last 168 hours.  Sepsis Markers No results for input(s): LATICACIDVEN, PROCALCITON, O2SATVEN in the last 168 hours.  ABG No results for input(s): PHART, PCO2ART, PO2ART in the last 168 hours.  Liver Enzymes  Recent Labs Lab 09/17/15 0500 09/18/15 0535 09/19/15 0507  ALBUMIN 1.7* 1.8* 1.7*    Cardiac Enzymes No results for input(s): TROPONINI, PROBNP in the last 168 hours.  Glucose  Recent Labs Lab 09/18/15 1908 09/18/15 2329 09/19/15 0353 09/19/15 0746 09/19/15 0821 09/19/15 1132  GLUCAP 161* 110* 157* 48* 116* 55*    Imaging Dg Chest Port 1 View  Result Date: 09/19/2015 CLINICAL DATA:  Intubation. EXAM: PORTABLE CHEST 1 VIEW COMPARISON:  09/17/2015. FINDINGS: Interim extubation. Interim removal of NG tube. Left subclavian line stable position. Heart size stable. Bibasilar infiltrates and/or edema again noted without interim change . No pleural effusion or pneumothorax. IMPRESSION: 1. Interim removal of endotracheal tube and NG tube. Left subclavian line in stable position. 2. Persistent bibasilar infiltrates and or edema noted. No interim change. Electronically Signed   By: Maisie Fus  Register   On: 09/19/2015 07:03     STUDIES:  Renal U/S 7/23: Lt hydronephrosis CT Abd/Pelvis 7/24: small superior uretal stone in with mild pelviectasis, additional non-obstructing stone on R Port CXR 8/5: No focal opacity or effusion  appreciated. Endotracheal tube in good position. Left subclavian central venous catheter in good position. Enteric feeding tube coursing below diaphragm.  MICROBIOLOGY: MRSA PCR 7/23:  Negative  Blood Ctx x2 7/23:  Negative Urine Ctx 7/23:  Negative  Tracheal Asp Ctx 8/1:  Pseudomonas aeruginosa   ANTIBIOTICS: Zosyn 7/24 - 7/25 Vancomycin 7/25 Augmentin 7/25 - 7/26 Unasyn 7/25 - 7/31 Fortaz 8/2 - 8/4 Merrem 8/4 >>  SIGNIFICANT EVENTS: 7/24 - Admit to ICU w/ R  Pneumonia & Acute Renal Failure on CRRT 7/25 - Perc nephrostomy tube placed 7/26 - Transferred to floor 7/27 - Transfer to ICU for acute hypoxic resp failure & increased work of breathing 8/04 - ETT changed due to cuff leak 8/7  - cuff leak. Extubated. Full DNR re-confirmed   LINES/TUBES: L Fem CVL 7/24 - 7/26 L Perc Nephrostomy Tube 7/25 >>  ETT 7.5 7/26 >>8/7 L Subclav CVL 7/27 >>  ASSESSMENT / PLAN:  PULMONARY A: Acute Hypoxic Respiratory Failure Pseudomonas HCAP  -self extubated. Doing well;  P:   Wean O2 Mobilize Cont abx per below  Continuing Scopolamine Patch  CARDIOVASCULAR A: Sinus Tachycardia  P:  Monitoring on telemetry Vitals per unit protocol  RENAL A:   Acute Renal Failure - Creatinine stabilizing. Left Hydronephrosis - S/P Perc Nephrostomy tube by IR H/O BPH Hypernatremia - Free Water Deficit 4L-->better  Hyperchloremia - >better Hypokalemia - Resolved.  P:   Trending UOP with Foley & Nephrostomy Tube Monitoring daily electrolytes & renal function Nephrostomy tube per IR Will need outpt f/u with Urology if he survives Bethanechol tid When able to take PO thirst should correct water balance   GASTROENTEROLOGY  A: Protein calorie malnutrition.  P: Tube feeds while on vent Protonix VT daily  HEMATOLOGY: A: Anemia - Multifactorial. No active bleeding. S/P 1u PRBC 8/2. Leukocytosis - Resolved. Likely due to sepsis.  P: Trending cell counts daily w/ CBC SCDs  INFECTION A: Sepsis  UTI - S/P Tx. Pseudomonas HCAP - Previously on [place] but intermediate sensitivity.  P: Merrem Day #4 Plan to re-culture for fever  ENDOCRINE A: H/O DM Type II - Glucose uncontrolled. Now having hypoglycemia while NPO on 8/7  P: Dc nph Accu-Checks q4hr SSI per Resistant Algorithm Low rate D51/2 started   NEUROLOGIC: A: Acute Encephalopathy - Multifactorial w/ component of toxic metabolic. H/O Dementia - Following brain tumor resection in his  2630's. H/O Seizure D/O -->likely close to baseline  P: Continue lamictal, keppra, dilantin, zoloft Seizure Precautions  FAMILY UPDATE:  Family updated   TODAY'S SUMMARY:   55 y.o. male with multiple medical problems. Admitted with obstructive uropathy; now  status post percutaneous nephrostomy tube. Was intubated with Pseudomonas HCAP Versus tracheobronchitis. Now extubated after ETT cuff blown. Weaning oxygen. For today plan will be: wean O2, assess swallowing, mobilize and cont abx. I spoke to his sister Magda Paganiniudrey who re-affirms DNR status.   Simonne MartinetPeter E Babcock ACNP-BC [facility] Pulmonary/Critical Care Pager # [phone] OR # [phone] if no answer  PCCM Attending Note: Patient seen & examined with nurse practitioner. Please refer to his progress note which I have reviewed in detail. Patient extubated with blown cuff overnight. Respiratory status remains stable. Awaiting swallowing evaluation prior to initiating diet. I spoke with the patient's sister Darel HongJudy who is also his healthcare power of attorney. He is indeed DO NOT RESUSCITATE/DO NOT INTUBATE from this time moving forward. Holding NPH to prevent further hypoglycemia. Continuing close ICU monitoring given the risk of further clinical deterioration.  I have spent a total of 32 minutes of critical care time today caring for the patient, discussing the plan of care with family via phone, and reviewing the patient's electronic medical record.  Donna Christen Jamison Neighbor, M.D. [facility] Pulmonary & Critical Care Pager:  [phone] After 3pm or if no response, call [phone] 2:20 PM 09/19/15

## 2015-09-19 NOTE — Progress Notes (Signed)
Pharmacy Antibiotic Note  Cory Salazar is a 55 y.o. male admitted on 09/05/2015 who now has VDRF and pneumonia.  He is currently receiving ceftazidime, but sputum culture is growing Pseudomonas aeruginosa reported as I to ceftazidime and cefepime, S to ciprofloxacin, gentamicin, and imipenem.   Given patient's history of seizure disorder meropenem would be preferable over imipenem for carbapenem therapy.  Today, 09/19/2015: Day #4 meropenem  WBC WNL  Afebrile  SCr improved  No seizure activity noted  Plan:  Continue meropenem 1 gram IV q8h with pharmacy dosing assistance. Follow length of therapy  Height: 6\' 1"  (185.4 cm) Weight: 135 lb 9.3 oz (61.5 kg) IBW/kg (Calculated) : 79.9  Temp (24hrs), Avg:99.2 F (37.3 C), Min:98.5 F (36.9 C), Max:99.8 F (37.7 C)   Recent Labs Lab 09/15/15 0346 09/16/15 0450 09/17/15 0500 09/18/15 0535 09/19/15 0507  WBC 11.3* 11.1* 11.6* 7.9 6.9  CREATININE 1.65* 1.51* 1.56* 1.50* 1.15    Estimated Creatinine Clearance: 64.6 mL/min (by C-G formula based on SCr of 1.15 mg/dL).    No Known Allergies  Antimicrobials this admission: 7/23 Vancomycin >> 7/25 7/23 Zosyn >> 7/25 7/25 Augmentin >> 7/26 7/26 Unasyn >> 8/1 8/1 Ceftazidime >>8/4 8/4 Meropenem >>  Dose adjustments this admission: n/a  Microbiology results: 7/23 BCx: NGTD 7/23 UCx: NGF  7/23 MRSA PCR: negative 7/24 CDiff antigen / toxin negative 7/24 GI panel negative 8/1 ETT: abundant P.aeruginosa, I to ceftaz, cefepime;  S to ciproflox, gent, imipenem (using meropenem d/t patient's h/o seizure d/o)   Thank you for allowing pharmacy to be a part of this patient's care.  Juliette Alcideustin Shun Pletz, PharmD, BCPS.   Pager: [phone] 09/19/2015 7:52 AM

## 2015-09-19 NOTE — Progress Notes (Signed)
  Nurse tech notified this RN of a CBG of 55, Hypoglycemia protocol initiated immediately, NP on unit was notified and is coming to assess. CBG after D50 was 142, insulin held at this time. Will continue to monitor. Patient remains asymptomatic.

## 2015-09-19 NOTE — Progress Notes (Signed)
eLink Physician-Brief Progress Note Patient Name: Cory Salazar DOB: [birth year] MRN: [id number]     Recent Labs Lab 09/17/15 0500 09/18/15 0535 09/19/15 0507  HGB 7.7* 7.1* 6.7*  HCT 24.6* 23.5* 21.9*  WBC 11.6* 7.9 6.9  PLT 272 243 227    Recent Labs Lab 09/14/15 0412 09/15/15 0346 09/16/15 0450 09/17/15 0500 09/18/15 0535 09/19/15 0507  NA 145 147* 151* 152* 157* 148*  K 3.3* 3.3* 3.4* 3.1* 3.9 3.5  CL 118* 117* 120* 119* 123* 117*  CO2 22 24 26 26 27 28   GLUCOSE 353* 243* 93 77 225* 94  BUN 35* 40* 40* 42* 40* 35*  CREATININE 1.87* 1.65* 1.51* 1.56* 1.50* 1.15  CALCIUM 7.8* 8.0* 8.2* 8.3* 8.5* 8.1*  MG 1.9  --  2.0  --  2.3 2.2  PHOS 2.4*  --  2.8  --  2.3* 3.3     Anemia of critical illness No overrt blleed on camera exam again  Plan 1 unit prbc  No religion on file    Intervention Category Intermediate Interventions: Diagnostic test evaluation  Patrecia Veiga 09/19/2015, 6:43 AM

## 2015-09-19 NOTE — Progress Notes (Signed)
eLink Physician-Brief Progress Note Patient Name: Cory ScotSteven Salazar DOB: Sep 07, 1960 MRN: [id number]   Date of Service  09/19/2015  HPI/Events of Note  RT callng emD - significant cuff leak despite attempts to refill cuff  eICU Interventions  Reintubate/tube change by anesthesia     Intervention Category Major Interventions: Other:  Rapheal Masso 09/19/2015, 2:59 AM

## 2015-09-19 NOTE — Progress Notes (Signed)
Patient unable to have PO meds at this time (on venturi mask, speech consult pending) MD notified and requested alternative route for medications

## 2015-09-19 NOTE — Progress Notes (Signed)
Around 0300, this RT noted large cuff leak despite multiple attempts to refill cuff.  Vitals were stable, spo2 100%.  ETT was noted at correct tube position with bilateral breath sounds.  Pt had been suctioned multiple times throughout shift without incident.  Elink MD notified of cuff leak, and orders received to call CRNA to exchange ETT.  CRNA came and had difficulty replacing ett.  Bedside RN and charge RN at bedside.  Pt was breathing spontaneously per CRNA and left extubated.  Pt placed on 55%venturi mask.  No respiratory distress noted at this time.  MD notified.  HR98, Spo2 100%.

## 2015-09-19 NOTE — Progress Notes (Signed)
Orders to transfuse however blood bank samples expired at 0000, lab called to request new tubes and RN will draw type and crossmatch. Will transfuse as soon as available.

## 2015-09-19 NOTE — Progress Notes (Signed)
Nurse tech notified this RN of a CBG of 48, Hypoglycemia protocol initiated immediately, MD paged. Will continue to monitor.

## 2015-09-19 NOTE — Progress Notes (Signed)
Pharmacy Consult Note - Phenytoin Follow Up  Labs: phenytoin level 2.6  A/P: Patient currently on phenytoin 300mg  via tube qhs (note was on PO formulation prior to admission). Level currently subtherapeutic (goal 10-20) very likely because of TFs binding up phenytoin and reducing amount of drug absorbed into system. Will change phenytoin back to 300mg  IV qhs while on tube feeds and give 1g phenytoin bolus dose due to low level  Hessie KnowsJustin M Makenna Macaluso, PharmD, BCPS Pager [phone] 09/19/2015 4:04 PM

## 2015-09-19 NOTE — Progress Notes (Signed)
Patient ID: Cory Salazar, male   DOB: [birth year], 55 y.o.   MRN: [id number]   Earlier cuff leak -> so asked anesthesia for tube change but RN called back saying that tube change was not possible due to et tube blockage and now patient is extubated. Per sign out he is DNR/DNI when he gets extubated. Advised RN this would typically mean a medical extubation situation and not a tube change situation.   Cam cexam - he is calm, arousable with stimulation but resting well without distress   Attempted to update sisters Selinda OrionJudy Diffee and April HoldingAudrey Price - Selinda OrionJudy Getter called back. She confirmed she is the DPOA -> I d/w her about reintubation if patient develops respiratory distress. I had to ask her several times. She was quiet and finally answered that part of her wants him to be DNI but part wants him to reintubated.  lplan  - DNR but reintubate if neeeded - family to d./w bediside team about goals  Dr. Kalman ShanMurali Glennice Marcos, M.D., [facility]P Pulmonary and Critical Care Medicine Staff Physician [facility] Pulmonary and Critical Care Pager: [phone], If no answer or between  15:00h - 7:00h: call [phone]  09/19/2015 3:49 AM

## 2015-09-19 NOTE — Progress Notes (Signed)
Nutrition Follow-up  DOCUMENTATION CODES:   Non-severe (moderate) malnutrition in context of chronic illness  INTERVENTION:  - RD will follow-up 8/8 for POC related to nutrition.  NUTRITION DIAGNOSIS:   Inadequate oral intake related to inability to eat as evidenced by NPO status. -ongoing  GOAL:   Patient will meet greater than or equal to 90% of their needs -unable to meet at this time.  MONITOR:   Weight trends, Labs, Skin, I & O's  ASSESSMENT:   55 [facility] with fever and AKI.  He has hx of dementia after tx for brain tumor in his 3130's.  Found to have nephrolithiasis with Lt hydronephrosis and UTI.  Course complicated by VDRF and pneumonia.  [facility] of nephrostomy tube. Pt has been difficult to wean from the vent. Does not desire PEG.  8/7 Pt extubated early this AM and now on venturi mask. Estimated nutrition needs updated based on this event and calculated based on CBW of 61.5 kg. OGT was removed at time of extubation. Spoke with RN who reports CCM has not yet rounded on pt. Will monitor for placement of NGT versus SLP evaluation with diet advancement; pt does not wish for PEG per notes earlier in admission.   Unable to meet needs at this time. Weight trending down since admission. Medications reviewed; sliding scale Novolog, 25 units Humulin BID, 500 mg Keppra BID, 40 mg Protonix/day, 300 mg Dilantin per tube x1/day.  Labs reviewed; CBGs: 48-157 mg/dL, Na: 161148 mmol/L, Cl: 096117 mmol/L, BUN: 35 mg/dL, Ca: 8.1 mg/dL.    8/3 - Pt continues with OGT and TF at goal rate: Osmolite 1.5 @ 55 mL/hr with 30 mL Prostat once/day and 250 mL free water every 6 hours.  - Free water flush per MD order.  - This regimen is providing 2080 kcal, 98 grams of protein, and 2006 mL free water.  - Estimated nutrition needs continue to be based on admission weight of 65 kg as pt has some mild/moderate edema.  Patient is currently intubated on  ventilator support MV: 11.9 L/min Temp (24hrs), Avg:99.8 F (37.7 C), Max:100.8 F (38.2 C)  - Spoke with RN outside of pt's room. She states that pt is tolerating TF without issue.  - Noted MAP of 56 and BP of 83/50 at time of RD visit.  - RN reports that this is the lowest pt has been and that he was recently given Fentanyl.     Diet Order:  Diet NPO time specified  Skin:  Wound (see comment) (Stage 1 heel pressure injury, DTIs to bilateral buttocks)  Last BM:  8/6  Height:   Ht Readings from Last 1 Encounters:  09/18/15 6\' 1"  (1.854 m)    Weight:   Wt Readings from Last 1 Encounters:  09/19/15 135 lb 9.3 oz (61.5 kg)    Ideal Body Weight:  83.64 kg (kg)  BMI:  Body mass index is 17.89 kg/m.  Estimated Nutritional Needs:   Kcal:  1540-1845 (25-30 kcal/kg)  Protein:  75-85 grams  Fluid:  1.6-1.8 L/day  EDUCATION NEEDS:   No education needs identified at this time    Trenton GammonJessica Dovid Bartko, MS, RD, LDN Inpatient Clinical Dietitian Pager # [phone] After hours/weekend pager # [phone]

## 2015-09-19 NOTE — Progress Notes (Signed)
CRITICAL VALUE ALERT  0555  Date of notification:  09/20/15  Time of notification:  0555  Critical value read back:yes  Nurse who received alert: Ruffin Pyoabby April Carlyon  MD notified (1st page):  [facility] nurse took message  Time of first page: 0603  MD notified (2nd page):  Time of second page:  Responding MD:    Time MD responded:

## 2015-09-19 NOTE — Progress Notes (Signed)
Paged from ICU to room 1231 for exchange of endotrachial tube. Vital signs stable. Patient given versed 2 mg and Fentanyl 100 mcg for preparation of tube exchange. OGT to suction then removed.  Cuff pressure unable to maintain. Patient biting on tube. Attempted to place bougie down ETT  When noted  Patient breathing and coughing around ETT. ETT removed large mucous plug noted at the center of the ETT. Ambu 100 % used with minimal support patient breathing spontaeously, O2 Sat maintaining 100%,  Hr 107, BP 98/60. Patient placed on 55% face mask. Patient opens eyes and coughs effectively with stimulation. VSS. Physician who ordered tube exchange notified by nurse. Leaving patient on 55% face mask. House supervisor notified of situation. Patient stable.

## 2015-09-20 DIAGNOSIS — E162 Hypoglycemia, unspecified: Secondary | ICD-10-CM

## 2015-09-20 LAB — TYPE AND SCREEN
ABO/RH(D): A POS
ANTIBODY SCREEN: NEGATIVE
Unit division: 0

## 2015-09-20 LAB — GLUCOSE, CAPILLARY
Glucose-Capillary: 138 mg/dL — ABNORMAL HIGH (ref 65–99)
Glucose-Capillary: 162 mg/dL — ABNORMAL HIGH (ref 65–99)
Glucose-Capillary: 173 mg/dL — ABNORMAL HIGH (ref 65–99)
Glucose-Capillary: 170 mg/dL — ABNORMAL HIGH (ref 65–99)

## 2015-09-20 MED ORDER — RESOURCE THICKENUP CLEAR PO POWD: ORAL | Status: DC | PRN

## 2015-09-20 MED ORDER — BETHANECHOL CHLORIDE 5 MG PO TABS
5.0000 mg | ORAL_TABLET | Freq: Three times a day (TID) | ORAL | Status: DC
  Administered 2015-09-20 – 2015-09-24 (×14): 5 mg via ORAL
  Filled 2015-09-20 (×15): qty 1

## 2015-09-20 MED ORDER — PHENYTOIN 50 MG PO CHEW
300.0000 mg | CHEWABLE_TABLET | Freq: Every day | ORAL | Status: DC
  Administered 2015-09-20 – 2015-09-23 (×4): 300 mg via ORAL
  Filled 2015-09-20 (×5): qty 6

## 2015-09-20 MED ORDER — RESOURCE THICKENUP CLEAR PO POWD
ORAL | Status: DC | PRN
  Filled 2015-09-20: qty 125

## 2015-09-20 MED ORDER — INSULIN ASPART 100 UNIT/ML ~~LOC~~ SOLN
0.0000 [IU] | Freq: Three times a day (TID) | SUBCUTANEOUS | Status: DC
  Administered 2015-09-20 (×2): 4 [IU] via SUBCUTANEOUS
  Administered 2015-09-21 (×2): 3 [IU] via SUBCUTANEOUS
  Administered 2015-09-21: 4 [IU] via SUBCUTANEOUS
  Administered 2015-09-21: 3 [IU] via SUBCUTANEOUS
  Administered 2015-09-22: 7 [IU] via SUBCUTANEOUS
  Administered 2015-09-22: 4 [IU] via SUBCUTANEOUS
  Administered 2015-09-23: 3 [IU] via SUBCUTANEOUS
  Administered 2015-09-23 – 2015-09-24 (×4): 4 [IU] via SUBCUTANEOUS

## 2015-09-20 MED ORDER — QUETIAPINE FUMARATE 100 MG PO TABS
200.0000 mg | ORAL_TABLET | Freq: Two times a day (BID) | ORAL | Status: DC
  Administered 2015-09-20 – 2015-09-24 (×9): 200 mg via ORAL
  Filled 2015-09-20 (×9): qty 2

## 2015-09-20 MED ORDER — LEVETIRACETAM 500 MG PO TABS
500.0000 mg | ORAL_TABLET | Freq: Two times a day (BID) | ORAL | Status: DC
  Administered 2015-09-20 – 2015-09-24 (×9): 500 mg via ORAL
  Filled 2015-09-20 (×9): qty 1

## 2015-09-20 MED ORDER — LAMOTRIGINE 100 MG PO TABS
100.0000 mg | ORAL_TABLET | Freq: Two times a day (BID) | ORAL | Status: DC
  Administered 2015-09-20 – 2015-09-24 (×9): 100 mg via ORAL
  Filled 2015-09-20 (×10): qty 1

## 2015-09-20 NOTE — NC FL2 (Signed)
[place] LEVEL OF CARE SCREENING TOOL     IDENTIFICATION  Patient Name: Cory Salazar Birthdate: 01/12/1961 Sex: male Admission Date (Current Location): 09/05/2015  [facility] and [place] Number:  Engineering geologist and Address:  [facility],  [place] [address] [place]      Provider Number: [id number]  Attending Physician Name and Address:  Javier Glazier, MD  Relative Name and Phone Number:       Current Level of Care: Hospital Recommended Level of Care: [place] Prior Approval Number:    Date Approved/Denied:   PASRR Number:    Discharge Plan: SNF    Current Diagnoses: Patient Active Problem List   Diagnosis Date Noted  . Acute respiratory failure ([place])   . Aspiration pneumonia ([place])   . Malnutrition of moderate degree 09/06/2015  . Pressure ulcer 09/05/2015  . Sepsis ([place]) 09/05/2015  . Hydronephrosis   . HCAP (healthcare-associated pneumonia) 09/04/2015  . SIRS (systemic inflammatory response syndrome) ([place]) 09/04/2015  . ARF (acute renal failure) ([place]) 09/04/2015  . Seizure disorder ([place]) 09/04/2015    Orientation RESPIRATION BLADDER Height & Weight     Self  Normal Indwelling catheter Weight: 133 lb 6.1 oz (60.5 kg) Height:  6' 1"  (185.4 cm)  BEHAVIORAL SYMPTOMS/MOOD NEUROLOGICAL BOWEL NUTRITION STATUS  Other (Comment) (no behaviors)   Incontinent Diet  AMBULATORY STATUS COMMUNICATION OF NEEDS Skin   Extensive Assist Verbally                         Personal Care Assistance Level of Assistance    Bathing Assistance: Maximum assistance Feeding assistance: Maximum assistance Dressing Assistance: Maximum assistance     Functional Limitations Info  Sight, Hearing, Speech Sight Info: Adequate Hearing Info: Adequate Speech Info: Adequate    SPECIAL CARE FACTORS FREQUENCY  PT (By licensed PT)     PT Frequency: 5 x wk              Contractures Contractures Info: Not present     Additional Factors Info  Code Status Code Status Info: DNR             Current Medications (09/20/2015):  This is the current hospital active medication list Current Facility-Administered Medications  Medication Dose Route Frequency Provider Last Rate Last Dose  . acetaminophen (TYLENOL) tablet 650 mg  650 mg Oral Q6H PRN Mauri Brooklyn, MD   650 mg at 09/17/15 2237  . bethanechol (URECHOLINE) tablet 5 mg  5 mg Oral TID Erick Colace, NP      . chlorhexidine gluconate (SAGE KIT) (PERIDEX) 0.12 % solution 15 mL  15 mL Mouth Rinse BID Juanito Doom, MD   15 mL at 09/19/15 1951  . insulin aspart (novoLOG) injection 0-20 Units  0-20 Units Subcutaneous TID AC & HS Erick Colace, NP      . lamoTRIgine (LAMICTAL) tablet 100 mg  100 mg Oral BID Erick Colace, NP      . levETIRAcetam (KEPPRA) tablet 500 mg  500 mg Oral BID Erick Colace, NP      . meropenem (MERREM) 1 g in sodium chloride 0.9 % 100 mL IVPB  1 g Intravenous Q8H Randall K Absher, RPH   1 g at 09/20/15 0429  . phenytoin (DILANTIN) chewable tablet 300 mg  300 mg Oral QHS Erick Colace, NP      . QUEtiapine (SEROQUEL) tablet 200 mg  200 mg  Oral BID Erick Colace, NP      . [place]   Oral PRN Javier Glazier, MD      . sertraline (ZOLOFT) tablet 50 mg  50 mg Oral Daily Velvet Bathe, MD   50 mg at 09/18/15 0956  . sodium chloride flush (NS) 0.9 % injection 10-40 mL  10-40 mL Intracatheter Q12H Javier Glazier, MD   Stopped at 09/19/15 2200     Discharge Medications: Please see discharge summary for a list of discharge medications.  Relevant Imaging Results:  Relevant Lab Results:   Additional Information SSN [id number]  Gaelan Glennon, Randall An, LCSW

## 2015-09-20 NOTE — Progress Notes (Signed)
Patient ID: Cory Salazar, male   DOB: [birth year], 55 y.o.   MRN: [id number]    Referring Physician(s): Dr. Barron Alvine  Supervising Physician: Simonne Come  Patient Status: inpt  Chief Complaint: Left hydronephrosis  Subjective: Patient is extubated, but will not communicate with me.    Allergies: Review of patient's allergies indicates no known allergies.  Medications: Prior to Admission medications   Medication Sig Start Date End Date Taking? Authorizing Provider  acetaminophen (TYLENOL) 325 MG tablet Take 650 mg by mouth every 4 (four) hours as needed for mild pain, moderate pain or fever.   Yes Historical Provider, MD  insulin glargine (LANTUS) 100 UNIT/ML injection Inject 0.2 mLs (20 Units total) into the skin at bedtime. 09/05/15  Yes Ramonita Lab, MD  ipratropium-albuterol (DUONEB) 0.5-2.5 (3) MG/3ML SOLN Take 3 mLs by nebulization 4 (four) times daily. 09/05/15  Yes Ramonita Lab, MD  lactulose (CHRONULAC) 10 GM/15ML solution Take 30 mLs (20 g total) by mouth 2 (two) times daily. 09/05/15  Yes Ramonita Lab, MD  lamoTRIgine (LAMICTAL) 100 MG tablet Take 1 tablet (100 mg total) by mouth 2 (two) times daily. 09/05/15  Yes Ramonita Lab, MD  levETIRAcetam 500 mg in sodium chloride 0.9 % 100 mL Inject 500 mg into the vein every 12 (twelve) hours. 09/05/15  Yes Ramonita Lab, MD  LORazepam (ATIVAN) 2 MG/ML injection Inject 0.5 mLs (1 mg total) into the vein every 4 (four) hours as needed for seizure. 09/05/15  Yes Ramonita Lab, MD  mirtazapine (REMERON) 15 MG tablet Take 1 tablet (15 mg total) by mouth at bedtime. 09/05/15  Yes Ramonita Lab, MD  phenytoin (DILANTIN) 50 MG tablet Chew 6 tablets (300 mg total) by mouth at bedtime. 07/10/15 07/09/16 Yes Jeanmarie Plant, MD  piperacillin-tazobactam (ZOSYN) 3.375 GM/50ML IVPB Inject 50 mLs (3.375 g total) into the vein every 8 (eight) hours. 09/05/15  Yes Ramonita Lab, MD  potassium chloride SA (K-DUR,KLOR-CON) 20 MEQ tablet Take 20 mEq by mouth 2 (two) times  daily.   Yes Historical Provider, MD  QUEtiapine (SEROQUEL) 200 MG tablet Take 200 mg by mouth 2 (two) times daily. **Medication on hold from 09/01/15 to 09/04/15 per MD at [facility]**   Yes Historical Provider, MD  sertraline (ZOLOFT) 50 MG tablet Take 50 mg by mouth daily.   Yes Historical Provider, MD  sodium chloride 0.45 % solution Inject 100 mLs into the vein continuous. 09/05/15  Yes Ramonita Lab, MD  sodium chloride flush (NS) 0.9 % SOLN Inject 3 mLs into the vein every 12 (twelve) hours. 09/05/15  Yes Ramonita Lab, MD  tamsulosin (FLOMAX) 0.4 MG CAPS capsule Take 0.4 mg by mouth daily.   Yes Historical Provider, MD    Vital Signs: BP 103/68 (BP Location: Left Arm)   Pulse 93   Temp 98.6 F (37 C) (Oral)   Resp (!) 24   Ht  (1.854 m)   Wt 133 lb 6.1 oz (60.5 kg)   SpO2 96%   BMI 17.60 kg/m   Physical Exam: Abd: drain in good position with good clear yellow urine output. 2500cc yesterday.  Site is clean and intact  Imaging: Dg Abd 1 View  Result Date: 09/16/2015 CLINICAL DATA:  55 year old male with respiratory distress. Re intubated. Initial encounter. EXAM: ABDOMEN - 1 VIEW COMPARISON:  09/09/2015. Portable chest from today reported separately. FINDINGS: Portable AP supine view at 1404 hours. Enteric tube side hole at the distal thoracic esophagus level. Advance 8 cm to place  this within the stomach. Stable left percutaneous nephrostomy tube. Gas-filled but nondilated bowel loops in the visible abdomen. IMPRESSION: 1. Enteric tube side hole of the distal thoracic esophagus, advance 8 cm to place the side hole within the stomach. 2. Increased abdominal bowel gas, might reflect ileus. 3. Left side percutaneous nephrostomy appear stable. Electronically Signed   By: Odessa Fleming M.D.   On: 09/16/2015 14:27   Dg Chest Port 1 View  Result Date: 09/19/2015 CLINICAL DATA:  Intubation. EXAM: PORTABLE CHEST 1 VIEW COMPARISON:  09/17/2015. FINDINGS: Interim extubation. Interim removal  of NG tube. Left subclavian line stable position. Heart size stable. Bibasilar infiltrates and/or edema again noted without interim change . No pleural effusion or pneumothorax. IMPRESSION: 1. Interim removal of endotracheal tube and NG tube. Left subclavian line in stable position. 2. Persistent bibasilar infiltrates and or edema noted. No interim change. Electronically Signed   By: Maisie Fus  Register   On: 09/19/2015 07:03   Dg Chest Port 1 View  Result Date: 09/17/2015 CLINICAL DATA:  Acute hypoxemic respiratory failure EXAM: PORTABLE CHEST 1 VIEW COMPARISON:  09/16/2015 FINDINGS: Endotracheal tube, NG tube, and LEFT central venous line are unchanged. Normal cardiac silhouette. Mild central venous congestion is improved. No pneumothorax. IMPRESSION: 1. Improvement in central venous congestion. 2. Stable support apparatus. Electronically Signed   By: Genevive Bi M.D.   On: 09/17/2015 07:21   Dg Chest Port 1 View  Result Date: 09/16/2015 CLINICAL DATA:  55 year old male with respiratory distress. Re intubated. Initial encounter. EXAM: PORTABLE CHEST 1 VIEW COMPARISON:  0819 hours today and earlier. FINDINGS: Portable AP supine view at 1359 hours. Endotracheal tube appears appropriately placed, tip just below the clavicles. Enteric tube in place, but side hole the level of the distal thoracic esophagus. Recommend advancing 8 cm to place the side hole within the stomach. Stable left subclavian central line. Mildly lower lung volumes. Continued perihilar and basilar reticulonodular opacity in both lungs. No pneumothorax or pleural effusion identified. Mediastinal contours remain within normal limits. Mild gaseous distension of bowel in the upper abdomen. IMPRESSION: 1. Endotracheal tube in good position. Enteric tube side hole at the distal thoracic esophagus, advanced 8 cm to place the side hole within the stomach. 2. Stable ventilation, bilateral reticulonodular opacity with top differential considerations  of interstitial edema versus viral/atypical infection. Electronically Signed   By: Odessa Fleming M.D.   On: 09/16/2015 14:26    Labs:  CBC:  Recent Labs  09/16/15 0450 09/17/15 0500 09/18/15 0535 09/19/15 0507  WBC 11.1* 11.6* 7.9 6.9  HGB 7.6* 7.7* 7.1* 6.7*  HCT 23.5* 24.6* 23.5* 21.9*  PLT 214 272 243 227    COAGS:  Recent Labs  09/06/15 0337  INR 1.48    BMP:  Recent Labs  09/16/15 0450 09/17/15 0500 09/18/15 0535 09/19/15 0507  NA 151* 152* 157* 148*  K 3.4* 3.1* 3.9 3.5  CL 120* 119* 123* 117*  CO2 26 26 27 28   GLUCOSE 93 77 225* 94  BUN 40* 42* 40* 35*  CALCIUM 8.2* 8.3* 8.5* 8.1*  CREATININE 1.51* 1.56* 1.50* 1.15  GFRNONAA 51* 49* 51* >60  GFRAA 59* 57* 60* >60    LIVER FUNCTION TESTS:  Recent Labs  07/10/15 1231 09/04/15 1102 09/05/15 0059  09/06/15 0337 09/12/15 0352 09/17/15 0500 09/18/15 0535 09/19/15 0507  BILITOT 0.4 3.1* 3.5*  --  1.8*  --   --   --   --   AST 27 30 24   --  11*  --   --   --   --   ALT 23 20 17   --  15*  --   --   --   --   ALKPHOS 141* 175* 120  --  83  --   --   --   --   PROT 8.1 8.0 6.2*  --  6.0*  --   --   --   --   ALBUMIN 3.9 2.8* 2.2*  < > 2.3* 1.7* 1.7* 1.8* 1.7*  < > = values in this interval not displayed.  Assessment and Plan: 1. Left hydronephrosis, S/p left PCN 7/25 -draining well -cont flushes and routine drain care -plans per urology are to follow up as an outpatient for eventual definitive stone procedure if he makes it out of the hospital.  Electronically Signed: Letha CapeSBORNE,Hillis Mcphatter E 09/20/2015, 12:57 PM   I spent a total of 15 Minutes at the the patient's bedside AND on the patient's hospital floor or unit, greater than 50% of which was counseling/coordinating care for left hydronephrosis

## 2015-09-20 NOTE — Evaluation (Signed)
SLP Cancellation Note  Patient Details Name: Cory Salazar MRN: [id number] DOB: 1960-06-05   Cancelled treatment:       Reason Eval/Treat Not Completed: Other (comment) (order cancelled by NP, RN advises pt not comfort care yet, please reorder if indicated)   Mills KollerKimball, Jaren Vanetten Ann Dock Baccam, MS [facility] SLP [phone]

## 2015-09-20 NOTE — Progress Notes (Addendum)
Nutrition Follow-up  DOCUMENTATION CODES:   Non-severe (moderate) malnutrition in context of chronic illness  INTERVENTION:  - Will order Magic Cup TID with meals, each supplement provides 290 kcal and 9 grams of protein - Tech/RN to assist with meals. - Continue to encourage PO intakes of meals and supplements. - RD will continue to monitor for additional nutrition-related needs.  NUTRITION DIAGNOSIS:   Inadequate oral intake related to inability to eat as evidenced by NPO status. -diet advanced at 0830 today.  GOAL:   Patient will meet greater than or equal to 90% of their needs -unmet  MONITOR:   PO intake, Supplement acceptance, Weight trends, Labs  ASSESSMENT:   55 yo male [facility] with fever and AKI.  He has hx of dementia after tx for brain tumor in his 71's.  Found to have nephrolithiasis with Lt hydronephrosis and UTI.  Course complicated by VDRF and pneumonia.  Transferred to [facility] for IR placement of nephrostomy tube. Pt has been difficult to wean from the vent. Does not desire PEG.  8/8 Pt now on room air. Diet advanced to Regular with thin liquids today at 0830 and Tech and RN currently in pt's room to assist him with breakfast (oatmeal, eggs, yogurt, and coffee per their report). Resource Thicken Up order is in place. SLP note from this AM states that consult to SLP team was cancelled. Unsure if pt needs thickened liquids or is okay with thin liquids so will order Magic Cup as oral supplement; will adjust as more information about swallowing ability is known.  Medications reviewed; sliding scale Novolog, 500 mg oral Keppra BID. Labs reviewed; CBGs: 138 and 162 mg/dL this AM.   ADDENDUM: Pt consumed 100% of chocolate pudding earlier this AM (per RN), 100% of yogurt, 75% of oatmeal, a few bites of grits, and 25-50% of scrambled eggs. RN reports that pt tolerated items well but seemed to have some difficulty with the eggs; talked about current diet order  and plan for communication with RN once pt moves to the floor that pt needs very soft items. Will not downgrade diet at this time as to not limit pt's choices. Pt missing many teeth/poor dentition. RN reports she had requested Resource Thicken Up as pt had some difficulty with water 8/7. Will continue to monitor for additional needs related to diet order and oral nutrition supplements.    8/7 - Pt extubated early this AM and now on venturi mask.  - Estimated nutrition needs updated based on this event and calculated based on CBW of 61.5 kg.  - Weight trending down since admission. - OGT was removed at time of extubation.  - Spoke with RN who reports CCM has not yet rounded on pt.  - Will monitor for placement of NGT versus SLP evaluation with diet advancement; pt does not wish for PEG per notes earlier in admission.    8/3 - Pt continues with OGT and TF at goal rate: Osmolite 1.5 @ 55 mL/hr with 30 mL Prostat once/day and 250 mL free water every 6 hours.  - Free water flush per MD order.  - This regimen is providing 2080 kcal, 98 grams of protein, and 2006 mL free water.  - Estimated nutrition needs continue to be based on admission weight of 65 kg as pt has some mild/moderate edema.  Patient is currently intubated on ventilator support MV: 11.9L/min Temp (24hrs), Avg:99.8 F (37.7 C), Max:100.8 F (38.2 C)  - Spoke with RN  outside of pt's room. She states that pt is tolerating TF without issue.  - Noted MAP of 56 and BP of 83/50 at time of RD visit.  - RN reports that this is the lowest pt has been and that he was recently given Fentanyl.    Diet Order:  Diet regular Room service appropriate? Yes; Fluid consistency: Thin  Skin:  Wound (see comment) (Stage 1 heel pressure injury, DTIs to bilateral buttocks)  Last BM:  8/6  Height:   Ht Readings from Last 1 Encounters:  09/18/15 6\' 1"  (1.854 m)    Weight:   Wt Readings from Last 1 Encounters:  09/20/15 133 lb 6.1 oz  (60.5 kg)    Ideal Body Weight:  83.64 kg (kg)  BMI:  Body mass index is 17.6 kg/m.  Estimated Nutritional Needs:   Kcal:  1540-1845 (25-30 kcal/kg)  Protein:  75-85 grams  Fluid:  1.6-1.8 L/day  EDUCATION NEEDS:   No education needs identified at this time    Trenton GammonJessica Marvel Mcphillips, MS, RD, LDN Inpatient Clinical Dietitian Pager # [phone] After hours/weekend pager # [phone]

## 2015-09-20 NOTE — Progress Notes (Signed)
PULMONARY / CRITICAL CARE MEDICINE   Name: Cory ScotSteven Salazar MRN: [id number] DOB: [birth year]    ADMISSION DATE:  09/05/2015 CONSULTATION DATE:  09/05/15  REFERRING MD: Amado CoeGouru, A  CHIEF COMPLAINT:  Acute Renal Failure  HISTORY: 55 yo male [facility] to [facility]/24 with fever and AKI.  He has hx of dementia after tx for brain tumor in his 5130's.  Found to have nephrolithiasis with Lt hydronephrosis and UTI.  Course complicated by VDRF and pneumonia.  Transfer[facility] for IR placement of nephrostomy tube.  SUBJECTIVE:  No distress   REVIEW OF SYSTEMS:  Unable to obtain   VITAL SIGNS: BP 111/77 (BP Location: Left Arm)   Pulse 93   Temp 98.9 F (37.2 C) (Oral)   Resp (!) 27   Ht 6\' 1"  (1.854 m)   Wt 133 lb 6.1 oz (60.5 kg)   SpO2 97%   BMI 17.60 kg/m  Room air  INTAKE / OUTPUT: I/O last 3 completed shifts: In: 2778.8 [I.V.:1588.8; Blood:580; IV Piggyback:610] Out: 4600 [Urine:4600]  PHYSICAL EXAMINATION: General: No distress.  Comfortable  Neuro: Awake. Follows commands bilaterally. Moving all 4 extremities equally. Oriented to self  HEENT: NCAT. No scleral icterus. Pupils symmetric.  Cardiovascular: regular rhythm. No JVD appreciated. Sinus tachycardia on telemetry. Pulmonary: clear and decreased bases  Abdomen: Soft. Normal bowel sounds. Nondistended. Integument: Warm and dry. No rash on exposed skin.   BMET  Recent Labs Lab 09/17/15 0500 09/18/15 0535 09/19/15 0507  NA 152* 157* 148*  K 3.1* 3.9 3.5  CL 119* 123* 117*  CO2 26 27 28   BUN 42* 40* 35*  CREATININE 1.56* 1.50* 1.15  GLUCOSE 77 225* 94    Electrolytes  Recent Labs Lab 09/16/15 0450 09/17/15 0500 09/18/15 0535 09/19/15 0507  CALCIUM 8.2* 8.3* 8.5* 8.1*  MG 2.0  --  2.3 2.2  PHOS 2.8  --  2.3* 3.3    CBC  Recent Labs Lab 09/17/15 0500 09/18/15 0535 09/19/15 0507  WBC 11.6* 7.9 6.9  HGB 7.7* 7.1* 6.7*  HCT 24.6* 23.5* 21.9*  PLT 272 243 227    Coag's No results for input(s):  APTT, INR in the last 168 hours.  Sepsis Markers No results for input(s): LATICACIDVEN, PROCALCITON, O2SATVEN in the last 168 hours.  ABG No results for input(s): PHART, PCO2ART, PO2ART in the last 168 hours.  Liver Enzymes  Recent Labs Lab 09/17/15 0500 09/18/15 0535 09/19/15 0507  ALBUMIN 1.7* 1.8* 1.7*    Cardiac Enzymes No results for input(s): TROPONINI, PROBNP in the last 168 hours.  Glucose  Recent Labs Lab 09/19/15 1151 09/19/15 1624 09/19/15 1928 09/19/15 2335 09/20/15 0418 09/20/15 0718  GLUCAP 142* 78 82 115* 138* 162*    Imaging No results found.   STUDIES:  Renal U/S 7/23: Lt hydronephrosis CT Abd/Pelvis 7/24: small superior uretal stone in with mild pelviectasis, additional non-obstructing stone on R Port CXR 8/5: No focal opacity or effusion appreciated. Endotracheal tube in good position. Left subclavian central venous catheter in good position. Enteric feeding tube coursing below diaphragm.  MICROBIOLOGY: MRSA PCR 7/23:  Negative  Blood Ctx x2 7/23:  Negative Urine Ctx 7/23:  Negative  Tracheal Asp Ctx 8/1:  Pseudomonas aeruginosa   ANTIBIOTICS: Zosyn 7/24 - 7/25 Vancomycin 7/25 Augmentin 7/25 - 7/26 Unasyn 7/25 - 7/31 Elita QuickFortaz 8/2 - 8/4 Merrem 8/4 >>  RESOLVED ISSUES Acute Hypoxic Respiratory Failure Acute Renal Failure  Hyperchloremia Hypokalemia  Acute Encephalopathy - Multifactorial w/ component of toxic metabolic.  SIGNIFICANT EVENTS: 7/24 - Admit to ICU w/ R Pneumonia & Acute Renal Failure on CRRT 7/25 - Perc nephrostomy tube placed 7/26 - Transferred to floor 7/27 - Transfer to ICU for acute hypoxic resp failure & increased work of breathing 8/04 - ETT changed due to cuff leak 8/7  - cuff leak. Extubated. Full DNR re-confirmed   LINES/TUBES: L Fem CVL 7/24 - 7/26 L Perc Nephrostomy Tube 7/25 >>  ETT 7.5 7/26 >>8/7 L Subclav CVL 7/27 >> 8/8  ASSESSMENT / PLAN:   Left Hydronephrosis - S/P Perc Nephrostomy tube by  IR H/O BPH Hypernatremia - Free Water Deficit 4L-->better  Plan:   Trending UOP Monitoring daily electrolytes & renal function Nephrostomy tube per IR Will need outpt f/u with Urology Bethanechol tid PO thirst should correct water balance  Dc foley  Pseudomonas HCAP - Previously on [place] but intermediate sensitivity. Sepsis  UTI - S/P Tx Plan: Merrem Day #5/10 Plan to re-culture for fever Dc CVL  H/O Dementia - Following brain tumor resection in his 23's. Severe deconditioning  H/O Seizure D/O -->likely close to baseline  Plan: Continue lamictal, keppra, dilantin, zoloft Seizure Precautions Will eventually go back to SNF   Dysphagia.. Family does not want PEG.  Protein calorie malnutrition. Plan: Diet as tolerated Protonix VT daily  Anemia - Multifactorial. No active bleeding. S/P 1u PRBC 8/2. Plan: Trending cell counts daily w/ CBC SCDs  H/O DM Type II - Glucose uncontrolled. Now having hypoglycemia while NPO on 8/7 Plan: Accu-Checks AC and HS SSI per Resistant Algorithm Low rate D51/2 started    FAMILY UPDATE:  Family updated   TODAY'S SUMMARY:   55 y.o. male with multiple medical problems. Admitted with obstructive uropathy; now  status post percutaneous nephrostomy tube. Was intubated with Pseudomonas HCAP Versus tracheobronchitis. Now extubated & on room air. We re-confirmed DNR yesterday. He is making progress and likely near baseline. For today we will resume diet, cont abx, move out of ICU and dc foley. He can likely go back to the SNF in the next 48 hrs or so. We would not re-escalate care should he decompensate.    Simonne Martinet ACNP-BC [facility] Pulmonary/Critical Care Pager # [phone] OR # [phone] if no answer  PCCM Attending Note: Patient seen and examined with nurse practitioner. Please refer to his progress note which I have reviewed in detail. No acute events overnight. Unable to obtain accurate review of systems.  BP 93/72   Pulse 93    Temp 98.6 F (37 C) (Oral)   Resp (!) 28   Ht 6\' 1"  (1.854 m)   Wt 133 lb 6.1 oz (60.5 kg)   SpO2 96%   BMI 17.60 kg/m  Gen.: Patient sitting up in bed. No distress. Awake. Pulmonary: Normal work of breathing. Slightly diminished breath sounds bilateral lung bases but overall good aeration. Cardiovascular: Regular rate. No appreciable JVD.  A/P: Patient recovering well from acute hypoxic respiratory failure and pseudomonas tracheobronchitis. Hypoglycemia yesterday secondary to subcutaneous insulin and improved with dextrose IV fluid. CODE STATUS confirmed with family yesterday that the patient is full DO NOT RESUSCITATE/DO NOT INTUBATE. Given patient's continued stability in recovery from his respiratory failure he can transition out of the intensive care unit.  1. Acute hypoxic respiratory failure: Continuing to improve. Continuing excellent spirometry for pulmonary toilette. 2. Pseudomonas tracheobronchitis: Currently on day #5/10 of meropenem. Recommend continuing antibiotic therapy. 3. Seizure disorder: Continuing seizure precautions. Continuing Lamictal, Keppra, Dilantin, and Zoloft. 4. Dysphagia: Family  does not want a PEG. Continuing thickened liquids. Speech consulted. 5. Hypoglycemia: Resolved with dextrose IV fluid. 6. Diabetes mellitus type 2: Continuing with Accu-Cheks every before meals & at bedtime along with sliding scale insulin. Continuing to hold on basal insulin. 7. Disposition: Patient will ultimately transitioning back to skilled nursing facility. 8. CODE STATUS: Patient is full DO NOT RESUSCITATE/DO NOT INTUBATE.  Donna Christen Jamison Neighbor, M.D. [facility] Pulmonary & Critical Care Pager:  [phone] After 3pm or if no response, call [phone] 2:14 PM 09/20/15

## 2015-09-20 NOTE — Progress Notes (Signed)
CSW assisting with d/c planning. PN reviewed. FL2 updated. Updated clinical[facility]. SNF is able to readmit pt when stable for d/c. CSW has left message for pt's sister to provide dc planning update. Awaiting return call. CSW will contitnue to follow to assist with d/c planning to SNF.  Cori RazorJamie Karnell Vanderloop LCSW [phone]

## 2015-09-21 LAB — BASIC METABOLIC PANEL
ANION GAP: 7 (ref 5–15)
BUN: 32 mg/dL — ABNORMAL HIGH (ref 6–20)
CO2: 23 mmol/L (ref 22–32)
Calcium: 8.5 mg/dL — ABNORMAL LOW (ref 8.9–10.3)
Chloride: 118 mmol/L — ABNORMAL HIGH (ref 101–111)
Creatinine, Ser: 1.06 mg/dL (ref 0.61–1.24)
GFR calc Af Amer: >60 mL/min (ref >60)
Glucose, Bld: 167 mg/dL — ABNORMAL HIGH (ref 65–99)
POTASSIUM: 3.5 mmol/L (ref 3.5–5.1)
SODIUM: 148 mmol/L — AB (ref 135–145)

## 2015-09-21 LAB — CBC
HCT: 26.8 % — ABNORMAL LOW (ref 39.0–52.0)
Hemoglobin: 8.5 g/dL — ABNORMAL LOW (ref 13.0–17.0)
MCH: 26.6 pg (ref 26.0–34.0)
MCHC: 31.7 g/dL (ref 30.0–36.0)
MCV: 83.8 fL (ref 78.0–100.0)
PLATELETS: 210 10*3/uL (ref 150–400)
RBC: 3.2 MIL/uL — AB (ref 4.22–5.81)
RDW: 16.5 % — ABNORMAL HIGH (ref 11.5–15.5)
WBC: 5.8 10*3/uL (ref 4.0–10.5)

## 2015-09-21 LAB — GLUCOSE, CAPILLARY
GLUCOSE-CAPILLARY: 140 mg/dL — AB (ref 65–99)
GLUCOSE-CAPILLARY: 115 mg/dL — AB (ref 65–99)
GLUCOSE-CAPILLARY: 122 mg/dL — AB (ref 65–99)
GLUCOSE-CAPILLARY: 125 mg/dL — AB (ref 65–99)
Glucose-Capillary: 173 mg/dL — ABNORMAL HIGH (ref 65–99)
Glucose-Capillary: 144 mg/dL — ABNORMAL HIGH (ref 65–99)

## 2015-09-21 NOTE — Care Management Important Message (Signed)
Important Message  Patient Details  Name: Cory Salazar MRN: [id number] Date of Birth: 07-21-1960   Medicare Important Message Given:  Yes    Haskell FlirtJamison, Nora Rooke 09/21/2015, 10:29 AMImportant Message  Patient Details  Name: Cory Salazar MRN: [id number] Date of Birth: 07-21-1960   Medicare Important Message Given:  Yes    Haskell FlirtJamison, Mickeal Daws 09/21/2015, 10:29 AM

## 2015-09-21 NOTE — Progress Notes (Signed)
Urology  Patient was initially transferred emergently from [facility] due to infrastructure problems that facility. He has a stone that has not had definitive management. He currently has a nephrostomy tube. Internalization of the nephrostomy tube to double-J stent may be prudent prior to discharge. She will require definitive stone management through [facility] [place] facility. He previously was under the care of Dr. Apolinar JunesBrandon and follow-up and definitive procedure should be done through [facility]. He can be discharged to skilled nursing facility with the nephrostomy tube but again JJ stent placement is also an option and may lessen the complexity of having an external drainage device.

## 2015-09-21 NOTE — Progress Notes (Signed)
PROGRESS NOTE                                                                                                                                                                                                             Patient Demographics:    Cory Salazar, is a 55 y.o. male, DOB - [birth year], PQD:[id number]  Admit date - 09/05/2015   Admitting Physician Waldemar Dickens, MD  Outpatient Primary MD for the patient is No primary care provider on file.  LOS - 16  Outpatient Specialists:   No chief complaint on file.      Brief Narrative  55 year old male with history of dementia (after treatment for brain tumor in his 46s), was admitted to [facility] on 7/24 with acute kidney injury and fever. He was found to have nephrolithiasis with left-sided hydronephrosis and UTI. The hospital course was complicated with development of radiata FN pneumonia. He was transferred to [facility] long hospital for IR placement of nephrostomy tube.  ICU course SIGNIFICANT EVENTS: 7/24 - Admit to ICU w/ R Pneumonia & Acute Renal Failure on CRRT 7/25 - Perc nephrostomy tube placed 7/26 - Transferred to floor 7/27 - Transfer to ICU for acute hypoxic resp failure & increased work of breathing 8/04 - ETT changed due to cuff leak 8/7  - cuff leak. Extubated. Full DNR re-confirmed . 8/8-transfer to hospitalist service.  LINES/TUBES: L Fem CVL 7/24 - 7/26 L Perc Nephrostomy Tube 7/25 >>  ETT 7.5 7/26 >>8/7 L Subclav CVL 7/27 >> 8/8  STUDIES:  Renal U/S 7/23: Lt hydronephrosis CT Abd/Pelvis 7/24: small superior uretal stone in with mild pelviectasis, additional non-obstructing stone on R   MICROBIOLOGY: MRSA PCR 7/23:  Negative  Blood Ctx x2 7/23:  Negative Urine Ctx 7/23:  Negative  Tracheal Asp Ctx 8/1:  Pseudomonas aeruginosa   ANTIBIOTICS: Zosyn 7/24 - 7/25 Vancomycin 7/25 Augmentin 7/25 - 7/26 Unasyn 7/25 -  7/31 Fortaz 8/2 - 8/4 Merrem 8/4 >>      Subjective:   No overnight issues. Patient coughing frequently with feeding.   Assessment  & Plan :   Principal problem Acute respiratory failure with hypoxia (HCC) Associated Pseudomonas tracheobronchitis. Extubated. Continue pulmonary toilet. High risk for aspiration with dysphagia. Continue meropenem (day 6/10).  Active problems Obstructive uropathy Status post percutaneous nephrostomy tube placed by  IR. Draining well. Urology recommends that he would need definitive treatment for his stone by urologist at [facility] (was followed by Dr. Erlene Quan there). Recommend discharge to skilled nursing facility with nephrostomy tube. Recommend that internalization of the double-J stent may be helpful prior to discharge. I will discuss with urology tomorrow. Renal function stable.  Dysphagia ICU team discussed with family who do not want a PEG tube. Speech therapy consulted. I would place him on dysphagia level I diet for now.  Diabetes mellitus type 2 with episode of hypoglycemia Lantus held. Monitor on sliding scale coverage.  Seizure disorder Continue Keppra, Lamictal, Dilantin and Zoloft.  Moderate protein calorie malnutrition Appreciate nutrition consult.     Code Status : DO NOT RESUSCITATE  Family Communication  : None at bedside  Disposition Plan  : Skilled nursing facility possibly in 48 hours  Barriers For Discharge : Improving symptoms  Consults  :   PC CM Urology IR    DVT Prophylaxis  :  Lovenox -   Lab Results  Component Value Date   PLT 210 09/21/2015    Antibiotics  :    Anti-infectives    Start     Dose/Rate Route Frequency Ordered Stop   09/16/15 1400  meropenem (MERREM) 1 g in sodium chloride 0.9 % 100 mL IVPB     1 g 200 mL/hr over 30 Minutes Intravenous Every 8 hours 09/16/15 1243     09/15/15 1800  cefTAZidime (FORTAZ) 2 g in dextrose 5 % 50 mL IVPB  Status:  Discontinued     2 g 100 mL/hr over  30 Minutes Intravenous Every 8 hours 09/15/15 1206 09/16/15 1243   09/14/15 1400  cefTAZidime (FORTAZ) 2 g in dextrose 5 % 50 mL IVPB  Status:  Discontinued     2 g 100 mL/hr over 30 Minutes Intravenous Every 12 hours 09/14/15 1332 09/15/15 1206   09/12/15 1000  ampicillin-sulbactam (UNASYN) 1.5 g in sodium chloride 0.9 % 50 mL IVPB     1.5 g 100 mL/hr over 30 Minutes Intravenous Every 6 hours 09/12/15 0948 09/12/15 2205   09/11/15 1800  ampicillin-sulbactam (UNASYN) 1.5 g in sodium chloride 0.9 % 50 mL IVPB  Status:  Discontinued     1.5 g 100 mL/hr over 30 Minutes Intravenous Every 8 hours 09/11/15 1111 09/12/15 0948   09/08/15 2200  ampicillin-sulbactam (UNASYN) 1.5 g in sodium chloride 0.9 % 50 mL IVPB  Status:  Discontinued     1.5 g 100 mL/hr over 30 Minutes Intravenous 2 times daily 09/08/15 2040 09/11/15 1110   09/07/15 0930  ampicillin-sulbactam (UNASYN) 1.5 g in sodium chloride 0.9 % 50 mL IVPB  Status:  Discontinued     1.5 g 100 mL/hr over 30 Minutes Intravenous 2 times daily 09/07/15 0858 09/08/15 2040   09/06/15 1600  ceFAZolin (ANCEF) IVPB 2g/100 mL premix     2 g 200 mL/hr over 30 Minutes Intravenous To Radiology 09/06/15 1551 09/06/15 1638   09/06/15 1200  vancomycin (VANCOCIN) IVPB 1000 mg/200 mL premix  Status:  Discontinued     1,000 mg 200 mL/hr over 60 Minutes Intravenous Every 48 hours 09/05/15 1733 09/06/15 0954   09/06/15 1000  amoxicillin-clavulanate (AUGMENTIN) 875-125 MG per tablet 1 tablet  Status:  Discontinued     1 tablet Oral 2 times daily 09/06/15 0935 09/06/15 0954   09/06/15 1000  amoxicillin-clavulanate (AUGMENTIN) 500-125 MG per tablet 500 mg  Status:  Discontinued     1 tablet Oral Daily  09/06/15 0954 09/07/15 0858   09/05/15 2200  piperacillin-tazobactam (ZOSYN) IVPB 2.25 g  Status:  Discontinued     2.25 g 100 mL/hr over 30 Minutes Intravenous Every 8 hours 09/05/15 1733 09/06/15 0935        Objective:   Vitals:   09/20/15 1300 09/20/15 2115  09/21/15 0619 09/21/15 1412  BP: 93/72 110/85 99/77 101/74  Pulse:      Resp: (!) 28 (!) 22 (!) 22 (!) 22  Temp:  99.3 F (37.4 C) 97.5 F (36.4 C) 98.6 F (37 C)  TempSrc:  Oral Oral Oral  SpO2: 96% 96% 95% 94%  Weight:      Height:        Wt Readings from Last 3 Encounters:  09/20/15 60.5 kg (133 lb 6.1 oz)  09/05/15 66.4 kg (146 lb 6.2 oz)  07/10/15 88.8 kg (195 lb 12.8 oz)     Intake/Output Summary (Last 24 hours) at 09/21/15 1550 Last data filed at 09/21/15 1100  Gross per 24 hour  Intake               20 ml  Output             1650 ml  Net            -1630 ml     Physical Exam  Gen: Thin built male, poorly verbal, not in distress HEENT: Pallor present, moist mucosa, supple neck Chest: clear b/l, no added sounds CVS: N S1&S2, no murmurs, rubs or gallop GI: soft, NT, ND, BS+, left-sided nephrostomy tube draining clear urine Musculoskeletal: warm, no edema CNS: Alert and awake, poorly communicative, nonfocal    Data Review:    CBC  Recent Labs Lab 09/16/15 0450 09/17/15 0500 09/18/15 0535 09/19/15 0507 09/21/15 0834  WBC 11.1* 11.6* 7.9 6.9 5.8  HGB 7.6* 7.7* 7.1* 6.7* 8.5*  HCT 23.5* 24.6* 23.5* 21.9* 26.8*  PLT 214 272 243 227 210  MCV 81.6 81.5 83.0 83.9 83.8  MCH 26.4 25.5* 25.1* 25.7* 26.6  MCHC 32.3 31.3 30.2 30.6 31.7  RDW 17.2* 17.4* 17.4* 17.7* 16.5*  LYMPHSABS  --   --  1.5 1.5  --   MONOABS  --   --  0.6 0.5  --   EOSABS  --   --  0.2 0.2  --   BASOSABS  --   --  0.0 0.0  --     Chemistries   Recent Labs Lab 09/16/15 0450 09/17/15 0500 09/18/15 0535 09/19/15 0507 09/21/15 0834  NA 151* 152* 157* 148* 148*  K 3.4* 3.1* 3.9 3.5 3.5  CL 120* 119* 123* 117* 118*  CO2 26 26 27 28 23   GLUCOSE 93 77 225* 94 167*  BUN 40* 42* 40* 35* 32*  CREATININE 1.51* 1.56* 1.50* 1.15 1.06  CALCIUM 8.2* 8.3* 8.5* 8.1* 8.5*  MG 2.0  --  2.3 2.2  --     ------------------------------------------------------------------------------------------------------------------ No results for input(s): CHOL, HDL, LDLCALC, TRIG, CHOLHDL, LDLDIRECT in the last 72 hours.  No results found for: HGBA1C ------------------------------------------------------------------------------------------------------------------ No results for input(s): TSH, T4TOTAL, T3FREE, THYROIDAB in the last 72 hours.  Invalid input(s): FREET3 ------------------------------------------------------------------------------------------------------------------ No results for input(s): VITAMINB12, FOLATE, FERRITIN, TIBC, IRON, RETICCTPCT in the last 72 hours.  Coagulation profile No results for input(s): INR, PROTIME in the last 168 hours.  No results for input(s): DDIMER in the last 72 hours.  Cardiac Enzymes No results for input(s): CKMB, TROPONINI, MYOGLOBIN in the last 168 hours.  Invalid input(s): CK ------------------------------------------------------------------------------------------------------------------ No results found for: BNP  Inpatient Medications  Scheduled Meds: . bethanechol  5 mg Oral TID  . chlorhexidine gluconate (SAGE KIT)  15 mL Mouth Rinse BID  . insulin aspart  0-20 Units Subcutaneous TID AC & HS  . lamoTRIgine  100 mg Oral BID  . levETIRAcetam  500 mg Oral BID  . meropenem (MERREM) IV  1 g Intravenous Q8H  . phenytoin  300 mg Oral QHS  . QUEtiapine  200 mg Oral BID  . sertraline  50 mg Oral Daily   Continuous Infusions:  PRN Meds:.acetaminophen, RESOURCE THICKENUP CLEAR  Micro Results Recent Results (from the past 240 hour(s))  Culture, respiratory (NON-Expectorated)     Status: None   Collection Time: 09/13/15 10:49 AM  Result Value Ref Range Status   Specimen Description ENDOTRACHEAL  Final   Special Requests NONE  Final   Gram Stain   Final    FEW WBC PRESENT, PREDOMINANTLY PMN MODERATE GRAM NEGATIVE RODS RARE GRAM POSITIVE  COCCI IN PAIRS Performed at [facility]  Final   Report Status 09/16/2015 FINAL  Final   Organism ID, Bacteria PSEUDOMONAS AERUGINOSA  Final      Susceptibility   Pseudomonas aeruginosa - MIC*    CEFTAZIDIME 16 INTERMEDIATE Intermediate     CIPROFLOXACIN <=0.25 SENSITIVE Sensitive     GENTAMICIN <=1 SENSITIVE Sensitive     IMIPENEM 2 SENSITIVE Sensitive     CEFEPIME 4 INTERMEDIATE Intermediate     * ABUNDANT PSEUDOMONAS AERUGINOSA    Radiology Reports Ct Abdomen Pelvis Wo Contrast  Result Date: 09/05/2015 CLINICAL DATA:  Left-sided hydronephrosis EXAM: CT ABDOMEN AND PELVIS WITHOUT CONTRAST TECHNIQUE: Multidetector CT imaging of the abdomen and pelvis was performed following the standard protocol without IV contrast. COMPARISON:  Renal ultrasound - 09/04/2015 FINDINGS: The lack of intravenous contrast limits the ability to evaluate solid abdominal organs. Examination is further degraded secondary to patient respiratory artifact. Lower chest: Limited visualization of the lower thorax demonstrates dependent subpleural ground-glass atelectasis. Airspace opacities are seen within the right lower lobe with associated air bronchograms. Additionally, there are ill-defined nodules seen within the right lower lobe which appear to demonstrate a tree-in-bud pattern. No pleural effusion. Normal heart size. No pericardial effusion. Decreased attenuation of the intra cardiac blood pool suggestive of anemia. Hepatobiliary: Normal hepatic contour. The gallbladder appears filled with radiopaque material suggestive of sludge. No definite ascites. Pancreas: Normal noncontrast appearance of the pancreas Spleen: Normal noncontrast appearance of the spleen. Incidental common of a small splenule. Adrenals/Urinary Tract: Note is made of a punctate (approximately 0.5 x 0.6 cm) stone within the superior aspects of the left ureter (axial image 55, series 2; coronal image  64, series 602) which results in mild upstream ureterectasis and asymmetric left-sided pelvicaliectasis. No additional evidence of left-sided nephrolithiasis. Solitary punctate (approximately 2 mm) nonobstructing stone within the superior pole of the right kidney (image 40, series 2). No evidence of right-sided urinary obstruction. No renal stones are seen along expected course of the right ureter. The urinary bladder is decompressed with a Foley catheter. Several prominent phleboliths are seen within the left hemipelvis. Stomach/Bowel: A rectal catheter is in place. There is diffuse thickening of the rectal wall, potentially reactive. Moderate colonic stool burden without evidence of enteric obstruction. Normal noncontrast appearance of the terminal ileum. The appendix is not visualized, however there is no asymmetric right-sided very cecal inflammatory change. No pneumoperitoneum, pneumatosis or portal  venous gas. Vascular/Lymphatic: Minimal amount of atherosclerotic plaque within a normal caliber abdominal aorta. A central venous catheter tip terminates within the peripheral aspects of the left common iliac vein. No definitive bulky retroperitoneal, mesenteric, pelvic or inguinal lymphadenopathy on this noncontrast examination. Reproductive: No evidence approximately on this noncontrast examination. No free fluid in the pelvic cul-de-sac. Other: Mild diffuse body wall anasarca. Musculoskeletal: No acute or aggressive osseous abnormalities. Stigmata of DISH within the thoracic spine. IMPRESSION: 1. Punctate (approximately 0.6 cm) stone within the superior aspect of the left ureter results in very mild upstream left-sided ureterectasis and pelvicaliectasis. 2. Additional solitary punctate (approximately 2 mm) nonobstructing right-sided renal stone. No evidence of right-sided urinary obstruction. 3. Bibasilar atelectasis with potential developing airspace opacity within the right lower lobe. Clinical correlation is  advised. 4. Diffuse thickening of the rectal wall, potentially reactive secondary to placement of a rectal catheter though an enteritis could have a similar appearance. Clinical correlation is advised. No evidence of enteric obstruction. Electronically Signed   By: Sandi Mariscal M.D.   On: 09/05/2015 21:21  Dg Chest 1 View  Result Date: 09/05/2015 CLINICAL DATA:  Unsuccessful central line placement. EXAM: CHEST 1 VIEW COMPARISON:  Radiograph of same day. FINDINGS: The heart size and mediastinal contours are within normal limits. No pleural effusion is noted. Right lung is clear. Possible pleural line is seen in left lung apex which may represent small apical pneumothorax. The visualized skeletal structures are unremarkable. IMPRESSION: Possible small left apical pneumothorax. Follow-up radiographs are recommended. Critical Value/emergent results were called by telephone at the time of interpretation on 09/05/2015 at 1:34 pm to Lindwood Qua, nurse practitioner, who verbally acknowledged these results and will immediately contact Dr. Ashby Dawes. Electronically Signed   By: Marijo Conception, M.D.   On: 09/05/2015 13:36  Dg Abd 1 View  Result Date: 09/16/2015 CLINICAL DATA:  55 year old male with respiratory distress. Re intubated. Initial encounter. EXAM: ABDOMEN - 1 VIEW COMPARISON:  09/09/2015. Portable chest from today reported separately. FINDINGS: Portable AP supine view at 1404 hours. Enteric tube side hole at the distal thoracic esophagus level. Advance 8 cm to place this within the stomach. Stable left percutaneous nephrostomy tube. Gas-filled but nondilated bowel loops in the visible abdomen. IMPRESSION: 1. Enteric tube side hole of the distal thoracic esophagus, advance 8 cm to place the side hole within the stomach. 2. Increased abdominal bowel gas, might reflect ileus. 3. Left side percutaneous nephrostomy appear stable. Electronically Signed   By: Genevie Ann M.D.   On: 09/16/2015 14:27   US  Renal  Result Date: 09/04/2015 CLINICAL DATA:  Acute renal failure EXAM: RENAL / URINARY TRACT ULTRASOUND COMPLETE COMPARISON:  None. FINDINGS: Right Kidney: Length: 12.7 cm.  Slight increased cortical echogenicity Left Kidney: Length: 12.9 cm.  Mild hydronephrosis Bladder: The bladder is decompressed with a Foley catheter IMPRESSION: 1. Mild hydronephrosis on the left, age indeterminate. If there is concern for acute obstruction, CT scan may better evaluate. Electronically Signed   By: Dorise Bullion III M.D   On: 09/04/2015 13:50  Dg Chest Port 1 View  Result Date: 09/19/2015 CLINICAL DATA:  Intubation. EXAM: PORTABLE CHEST 1 VIEW COMPARISON:  09/17/2015. FINDINGS: Interim extubation. Interim removal of NG tube. Left subclavian line stable position. Heart size stable. Bibasilar infiltrates and/or edema again noted without interim change . No pleural effusion or pneumothorax. IMPRESSION: 1. Interim removal of endotracheal tube and NG tube. Left subclavian line in stable position. 2. Persistent bibasilar infiltrates and or  edema noted. No interim change. Electronically Signed   By: Marcello Moores  Register   On: 09/19/2015 07:03   Dg Chest Port 1 View  Result Date: 09/17/2015 CLINICAL DATA:  Acute hypoxemic respiratory failure EXAM: PORTABLE CHEST 1 VIEW COMPARISON:  09/16/2015 FINDINGS: Endotracheal tube, NG tube, and LEFT central venous line are unchanged. Normal cardiac silhouette. Mild central venous congestion is improved. No pneumothorax. IMPRESSION: 1. Improvement in central venous congestion. 2. Stable support apparatus. Electronically Signed   By: Suzy Bouchard M.D.   On: 09/17/2015 07:21   Dg Chest Port 1 View  Result Date: 09/16/2015 CLINICAL DATA:  55 year old male with respiratory distress. Re intubated. Initial encounter. EXAM: PORTABLE CHEST 1 VIEW COMPARISON:  0819 hours today and earlier. FINDINGS: Portable AP supine view at 1359 hours. Endotracheal tube appears appropriately placed, tip just  below the clavicles. Enteric tube in place, but side hole the level of the distal thoracic esophagus. Recommend advancing 8 cm to place the side hole within the stomach. Stable left subclavian central line. Mildly lower lung volumes. Continued perihilar and basilar reticulonodular opacity in both lungs. No pneumothorax or pleural effusion identified. Mediastinal contours remain within normal limits. Mild gaseous distension of bowel in the upper abdomen. IMPRESSION: 1. Endotracheal tube in good position. Enteric tube side hole at the distal thoracic esophagus, advanced 8 cm to place the side hole within the stomach. 2. Stable ventilation, bilateral reticulonodular opacity with top differential considerations of interstitial edema versus viral/atypical infection. Electronically Signed   By: Genevie Ann M.D.   On: 09/16/2015 14:26   Dg Chest Port 1 View  Result Date: 09/16/2015 CLINICAL DATA:  Hypoxia EXAM: PORTABLE CHEST 1 VIEW COMPARISON:  Study obtained earlier in the day and September 15, 2015 FINDINGS: Endotracheal tube tip is 9.7 cm above the carina. Central catheter tip is in the superior cava. Nasogastric tube tip and side port below the diaphragm. No pneumothorax. There is persistent hazy opacity in the right base. There is mild patchy opacity in the left base, slightly less than 1 day prior duct stable compared to earlier in the day. Heart size and pulmonary vascularity are normal. No adenopathy. IMPRESSION: Tube and catheter positions as described without pneumothorax. It may be prudent to consider advancing endotracheal tube approximately 4 cm given its rather high position at the level of T1. Patchy infiltrate in both lung bases, more on the right than on the left, stable on the right and slightly decreased on the left compared to 1 day prior. No new opacity. No change in cardiac silhouette. These results will be called to the ordering clinician or representative by the Radiologist Assistant, and communication  documented in the PACS or zVision Dashboard. Electronically Signed   By: Lowella Grip III M.D.   On: 09/16/2015 08:35   Dg Chest Port 1 View  Result Date: 09/16/2015 CLINICAL DATA:  Respiratory failure EXAM: PORTABLE CHEST 1 VIEW COMPARISON:  Yesterday FINDINGS: Tracheal extubation. A nasogastric tube is in good position. Left subclavian central line with tip at the distal SVC. History of pneumonia with stable bilateral lung opacities. No edema, effusion, or visible pneumothorax. Normal heart size. IMPRESSION: Stable inflation after extubation. History of pneumonia with stable bilateral opacity. Electronically Signed   By: Monte Fantasia M.D.   On: 09/16/2015 07:06   Dg Chest Port 1 View  Result Date: 09/15/2015 CLINICAL DATA:  Acute respiratory failure, intubated patient, aspiration pneumonia, acute renal failure EXAM: PORTABLE CHEST 1 VIEW COMPARISON:  Portable chest x-ray of  September 14, 2015 FINDINGS: The lungs are well-expanded. There is persistent interstitial and linear alveolar opacity in the mid and lower right lung. There is increased interstitial density in the left infrahilar region likely in the lower lobe as well. There is no significant pleural effusion. There is no pneumothorax. The heart and pulmonary vascularity are normal. The endotracheal tube tip lies 3.6 cm above the carina. The esophagogastric tube tip projects below the inferior margin of the image. The left subclavian catheter tip projects over the midportion of the SVC. IMPRESSION: Slight interval worsening of bilateral lower lobe infiltrates consistent with pneumonia. The support tubes are in stable position. Electronically Signed   By: David  Martinique M.D.   On: 09/15/2015 07:43   Dg Chest Port 1 View  Result Date: 09/14/2015 CLINICAL DATA:  Respiratory failure, intubated patient, healthcare associated pneumonia, acute renal failure. EXAM: PORTABLE CHEST 1 VIEW COMPARISON:  Portable chest x-ray of September 12, 2015 FINDINGS: The  lungs are well-expanded. There is stable alveolar opacity in the right lower lung. The left lung exhibits minimal interstitial prominence adjacent to the heart border which is stable. The heart and pulmonary vascularity are normal. The endotracheal tube tip projects 4.4 cm above the carina. The esophagogastric tube tip in proximal port lie below the inferior margin of the image. The left subclavian venous catheter tip projects over the midportion of the SVC. IMPRESSION: Persistent basilar interstitial and alveolar infiltrates, stable. The support tubes are in reasonable position. Electronically Signed   By: David  Martinique M.D.   On: 09/14/2015 07:03   Dg Chest Port 1 View  Result Date: 09/12/2015 CLINICAL DATA:  Acute respiratory failure EXAM: PORTABLE CHEST 1 VIEW COMPARISON:  09/10/2015 FINDINGS: Endotracheal tube, nasogastric catheter left subclavian central line are again seen and stable. Patchy infiltrative changes are noted in the bases right greater than left increased from the prior exam. No effusion or pneumothorax is seen. No bony abnormality is noted. IMPRESSION: Increasing bibasilar infiltrates. Electronically Signed   By: Inez Catalina M.D.   On: 09/12/2015 12:55   Dg Chest Port 1 View  Result Date: 09/10/2015 CLINICAL DATA:  Acute respiratory failure EXAM: PORTABLE CHEST 1 VIEW COMPARISON:  September 09, 2015 FINDINGS: The NG tube terminates below today's film. Support apparatus is otherwise stable. No pneumothorax. No pulmonary nodules or masses. Focal opacity in the medial right lung base persists. Recommend follow-up to resolution. IMPRESSION: 1. Stable support apparatus. 2. Persistent focal opacity in the medial right lung base. Electronically Signed   By: Dorise Bullion III M.D   On: 09/10/2015 07:37  Portable Chest Xray  Result Date: 09/09/2015 CLINICAL DATA:  Intubation. EXAM: PORTABLE CHEST 1 VIEW COMPARISON:  09/08/2015. FINDINGS: Endotracheal tube and NG tube in stable position. Left  subclavian line in stable position. Right lower lobe infiltrate consistent pneumonia. Heart size normal. Small right pleural effusion cannot be excluded. No pneumothorax . IMPRESSION: 1. Lines and tubes in stable position. 2. Right lower lobe infiltrate consistent with pneumonia. Slight interim progression from prior exam . Small right pleural effusion cannot be exclude. Electronically Signed   By: Marcello Moores  Register   On: 09/09/2015 06:45  Dg Chest Port 1 View  Result Date: 09/08/2015 CLINICAL DATA:  Endotracheal placement. EXAM: PORTABLE CHEST 1 VIEW COMPARISON:  09/06/2015 FINDINGS: Endotracheal tube has its tip 5 cm above the carina. Nasogastric tube enters the stomach. Left subclavian central line has its tip in the SVC above the right atrium. Heart size is normal. The left lung  is clear. There is infiltrate in the right lower lobe. IMPRESSION: Lines and tubes well positioned. Endotracheal tube 5 cm above the carina. Right lower lobe pneumonia persists. Electronically Signed   By: Nelson Chimes M.D.   On: 09/08/2015 13:34  Dg Chest Port 1 View  Result Date: 09/06/2015 CLINICAL DATA:  Pneumothorax. EXAM: PORTABLE CHEST 1 VIEW COMPARISON:  09/05/2015 . FINDINGS: Mediastinum hilar structures normal. Mild right base subsegmental atelectasis and or infiltrate. Small right pleural effusion. Heart size normal. IMPRESSION: Mild right base subsegmental atelectasis and or infiltrate. Right base pneumonia cannot be excluded. Similar finding noted on prior exam. Chest is otherwise stable. No pneumothorax. Electronically Signed   By: Marcello Moores  Register   On: 09/06/2015 07:15  Dg Chest Port 1 View  Result Date: 09/05/2015 CLINICAL DATA:  Chart states that there have been several attempts to place a "left" side IJ PICC line and there was concern for a possible pneumothorax. Pt has a medium sized bandage on right side of neck, suggesting that IJ PICC was attempted on right side. Ordering MD looked at image outside of  patient's room directly after I took it. Hx of diabetes. Pt was not able to verbally respond. EXAM: PORTABLE CHEST 1 VIEW COMPARISON:  09/05/2015 at 12:52 a.m. FINDINGS: There is now no evidence of a left pneumothorax. The previously seen linear opacity near the left apex was likely an artifact. Mild opacity at the right lung base is consistent with atelectasis. Consider pneumonia if there are consistent clinical symptoms. Lungs otherwise clear. No pleural effusion. Heart, mediastinum and hila are unremarkable. IMPRESSION: 1. No evidence of a pneumothorax on the current study. 2. Right lung base opacity most likely atelectasis. Pneumonia is possible. Electronically Signed   By: Lajean Manes M.D.   On: 09/05/2015 17:38  Portable Chest 1 View  Result Date: 09/05/2015 CLINICAL DATA:  Pneumonia EXAM: PORTABLE CHEST 1 VIEW COMPARISON:  September 04, 2015 FINDINGS: There is patchy airspace consolidation in the right base. There is new mild atelectatic change in the left base, possibly represent a second focus of early pneumonia. Lungs elsewhere clear. Heart is upper normal in size with pulmonary vascularity within normal limits. No adenopathy. No bone lesions. IMPRESSION: Patchy airspace opacity in the right apex consistent with a degree of pneumonia. Atelectasis is in the left lower lobe, new ; question a second focus of pneumonia developing in the left base. Lungs elsewhere clear. Stable cardiac silhouette. Electronically Signed   By: Lowella Grip III M.D.   On: 09/05/2015 07:15  Dg Chest Port 1 View  Result Date: 09/04/2015 CLINICAL DATA:  Fever, probable sepsis, diabetic. Pt unable to give hx at this time EXAM: PORTABLE CHEST 1 VIEW COMPARISON:  07/10/2015 FINDINGS: Midline trachea. Normal heart size. Numerous leads and wires project over the chest. No pleural effusion or pneumothorax. Mild medial right lung base airspace disease is suspected. Clear left lung. IMPRESSION: Subtle medial right lung airspace  disease. Although this could represent atelectasis, given the clinical history, suspicious for early infection. If possible, PA and lateral radiographs should be considered. Electronically Signed   By: Abigail Miyamoto M.D.   On: 09/04/2015 11:30  Dg Abd Portable 1v  Result Date: 09/09/2015 CLINICAL DATA:  OG tube placement. EXAM: PORTABLE ABDOMEN - 1 VIEW COMPARISON:  None. FINDINGS: OG tube is in place with the tip in the distal stomach. Nephrostomy on the left tube is noted. IMPRESSION: As above. Electronically Signed   By: Inge Rise M.D.  On: 09/09/2015 13:41  Ir Nephrostomy Placement Left  Result Date: 09/06/2015 INDICATION: Left hydronephrosis, urosepsis and left ureteral calculus. EXAM: IR NEPHROSTOMY PLACEMENT LEFT COMPARISON:  CT of the abdomen and pelvis on 09/05/2015 MEDICATIONS: 2 g IV Ancef. As antibiotic prophylaxis, Ancef was ordered pre-procedure and administered intravenously within one hour of incision. ANESTHESIA/SEDATION: Fentanyl 75 mcg IV; Versed 2.0 mg IV Moderate Sedation Time:  13 minutes. The patient was continuously monitored during the procedure by the interventional radiology nurse under my direct supervision. CONTRAST:  10 mL Isovue-300 - administered into the collecting system(s) FLUOROSCOPY TIME:  Fluoroscopy Time: 2 minutes and 54 seconds. COMPLICATIONS: None immediate. PROCEDURE: Informed written consent was obtained from the patient's sister after a thorough discussion of the procedural risks, benefits and alternatives. All questions were addressed. Maximal Sterile Barrier Technique was utilized including caps, mask, sterile gowns, sterile gloves, sterile drape, hand hygiene and skin antiseptic. A timeout was performed prior to the initiation of the procedure. Ultrasound was used to localize the left kidney. Under direct ultrasound guidance, a 21 gauge needle was advanced into the lower pole collecting system. Contrast was injected after aspiration of urine. A guidewire  was advanced. A transitional dilator was placed. The tract was dilated over a guidewire. A 10 French nephrostomy tube was advanced. Tube position was confirmed by fluoroscopy. The catheter was connected to a gravity drainage bag. It was secured at the skin with a Prolene retention suture and StatLock device. FINDINGS: Ultrasound shows mild left-sided hydronephrosis. After nephrostomy tube placement, there was some bloody in urine return related to bleeding in the collecting system with tube advancement. Due to small size of the renal pelvis, the nephrostomy tube could not be completely formed and was left partially extending into the proximal ureter. IMPRESSION: Left percutaneous nephrostomy tube placement with a 10 French catheter placed and advanced into the proximal ureter. This will be left to gravity bag drainage. Electronically Signed   By: Aletta Edouard M.D.   On: 09/06/2015 17:04   Time Spent in minutes  25   Louellen Molder M.D on 09/21/2015 at 3:50 PM  Between 7am to 7pm - Pager - [phone]  After 7pm go to [email or web address] - password [facility]  [facility] -  Office  [phone]

## 2015-09-21 NOTE — Progress Notes (Signed)
Pharmacy Consult Note - Phenytoin Follow Up  A/P: Pt on antiepileptics including phenytoin for hx seizures.  Home dose phenytoin 300mg  chewable tablets at bedtime.  During hospitalization, phenytoin adjusted from PT to IV after subtherapeutic level likely due to drug binding with TFs and pt received loading dose phenytoin 1g IV x1.  Pt now tolerating diet (note family refused PEG) and phenytoin transitioned back to chewable tablets 300mg  [facility] . Pharmacy will sign off consult.   Haynes Hoehnolleen Syon Tews, PharmD, BCPS 09/21/2015, 7:55 AM  Pager: [phone]

## 2015-09-22 LAB — GLUCOSE, CAPILLARY
GLUCOSE-CAPILLARY: 182 mg/dL — AB (ref 65–99)
GLUCOSE-CAPILLARY: 110 mg/dL — AB (ref 65–99)
Glucose-Capillary: 245 mg/dL — ABNORMAL HIGH (ref 65–99)
Glucose-Capillary: 113 mg/dL — ABNORMAL HIGH (ref 65–99)

## 2015-09-22 NOTE — Progress Notes (Signed)
PROGRESS NOTE                                                                                                                                                                                                             Patient Demographics:    Cory Salazar, is a 55 y.o. male, DOB - 1960/04/18, ZOX:[id number]  Admit date - 09/05/2015   Admitting Physician Waldemar Dickens, MD  Outpatient Primary MD for the patient is No primary care provider on file.  LOS - 17  Outpatient Specialists:   No chief complaint on file.      Brief Narrative  55 year old male with history of dementia (after treatment for brain tumor in his 56s), was admitted to [facility] on 7/24 with acute kidney injury and fever. He was found to have nephrolithiasis with left-sided hydronephrosis and UTI. The hospital course was complicated with development of Healthcare associated pneumonia. He was transferred to [facility] long hospital for IR placement of nephrostomy tube.  ICU course SIGNIFICANT EVENTS: 7/24 - Admit to ICU w/ R Pneumonia & Acute Renal Failure on CRRT 7/25 - Perc nephrostomy tube placed 7/26 - Transferred to floor 7/27 - Transfer to ICU for acute hypoxic resp failure & increased work of breathing 8/04 - ETT changed due to cuff leak 8/7  - cuff leak. Extubated. Full DNR re-confirmed . 8/8-transfer to hospitalist service.  LINES/TUBES: L Fem CVL 7/24 - 7/26 L Perc Nephrostomy Tube 7/25 >>  ETT 7.5 7/26 >>8/7 L Subclav CVL 7/27 >> 8/8  STUDIES:  Renal U/S 7/23: Lt hydronephrosis CT Abd/Pelvis 7/24: small superior uretal stone in with mild pelviectasis, additional non-obstructing stone on R   MICROBIOLOGY: MRSA PCR 7/23:  Negative  Blood Ctx x2 7/23:  Negative Urine Ctx 7/23:  Negative  Tracheal Asp Ctx 8/1:  Pseudomonas aeruginosa   ANTIBIOTICS: Zosyn 7/24 - 7/25 Vancomycin 7/25 Augmentin 7/25 - 7/26 Unasyn  7/25 - 7/31 Fortaz 8/2 - 8/4 Meropenem 8/4 >>      Subjective:   Has low-grade fever.   Assessment  & Plan :   Principal problem Acute respiratory failure with hypoxia (HCC) Associated Pseudomonas tracheobronchitis. Continue pulmonary toilet. High risk for aspiration with dysphagia. Continue meropenem (day7 /10).Switch to Levaquin upon discharge.  Active problems Obstructive uropathy Status post percutaneous nephrostomy tube placed by IR. Draining  well. Urology recommends that he would need definitive treatment for his stone by urologist at [facility] (was followed by Dr. Erlene Quan there). Recommend discharge to skilled nursing facility with nephrostomy tube. Recommend that internalization of the double-J stent may be helpful prior to discharge. Will discuss with IR. Renal function stable.  Dysphagia ICU team and myself discussed with patient's sister who refused against a PEG tube. Patient at high risk for aspiration. Seen by speech nurse and recommended dysphagia level I diet with thin liquid.   Diabetes mellitus type 2 with episode of hypoglycemia Lantus held. Stable on sliding scale coverage.  Seizure disorder Continue Keppra, Lamictal, Dilantin and Zoloft.  Moderate protein calorie malnutrition Appreciate nutrition consult.   Failure to thrive Discussed overall prognosis and goals of care with patient's sister Bethena Roys on the phone. She is not interested in palliative care discussion at this time. I have recommended palliative care evaluation at the nursing facility and she agrees.   Code Status : DO NOT RESUSCITATE  Family Communication  : Discussed with sister on the phone.  Disposition Plan  : Skilled nursing facility tomorrow.  Barriers For Discharge : Improving symptoms  Consults  :   PC CM Urology IR    DVT Prophylaxis  :  Lovenox -   Lab Results  Component Value Date   PLT 210 09/21/2015    Antibiotics  :    Anti-infectives    Start     Dose/Rate  Route Frequency Ordered Stop   09/16/15 1400  meropenem (MERREM) 1 g in sodium chloride 0.9 % 100 mL IVPB     1 g 200 mL/hr over 30 Minutes Intravenous Every 8 hours 09/16/15 1243     09/15/15 1800  cefTAZidime (FORTAZ) 2 g in dextrose 5 % 50 mL IVPB  Status:  Discontinued     2 g 100 mL/hr over 30 Minutes Intravenous Every 8 hours 09/15/15 1206 09/16/15 1243   09/14/15 1400  cefTAZidime (FORTAZ) 2 g in dextrose 5 % 50 mL IVPB  Status:  Discontinued     2 g 100 mL/hr over 30 Minutes Intravenous Every 12 hours 09/14/15 1332 09/15/15 1206   09/12/15 1000  ampicillin-sulbactam (UNASYN) 1.5 g in sodium chloride 0.9 % 50 mL IVPB     1.5 g 100 mL/hr over 30 Minutes Intravenous Every 6 hours 09/12/15 0948 09/12/15 2205   09/11/15 1800  ampicillin-sulbactam (UNASYN) 1.5 g in sodium chloride 0.9 % 50 mL IVPB  Status:  Discontinued     1.5 g 100 mL/hr over 30 Minutes Intravenous Every 8 hours 09/11/15 1111 09/12/15 0948   09/08/15 2200  ampicillin-sulbactam (UNASYN) 1.5 g in sodium chloride 0.9 % 50 mL IVPB  Status:  Discontinued     1.5 g 100 mL/hr over 30 Minutes Intravenous 2 times daily 09/08/15 2040 09/11/15 1110   09/07/15 0930  ampicillin-sulbactam (UNASYN) 1.5 g in sodium chloride 0.9 % 50 mL IVPB  Status:  Discontinued     1.5 g 100 mL/hr over 30 Minutes Intravenous 2 times daily 09/07/15 0858 09/08/15 2040   09/06/15 1600  ceFAZolin (ANCEF) IVPB 2g/100 mL premix     2 g 200 mL/hr over 30 Minutes Intravenous To Radiology 09/06/15 1551 09/06/15 1638   09/06/15 1200  vancomycin (VANCOCIN) IVPB 1000 mg/200 mL premix  Status:  Discontinued     1,000 mg 200 mL/hr over 60 Minutes Intravenous Every 48 hours 09/05/15 1733 09/06/15 0954   09/06/15 1000  amoxicillin-clavulanate (AUGMENTIN) 875-125 MG per tablet  1 tablet  Status:  Discontinued     1 tablet Oral 2 times daily 09/06/15 0935 09/06/15 0954   09/06/15 1000  amoxicillin-clavulanate (AUGMENTIN) 500-125 MG per tablet 500 mg  Status:   Discontinued     1 tablet Oral Daily 09/06/15 0954 09/07/15 0858   09/05/15 2200  piperacillin-tazobactam (ZOSYN) IVPB 2.25 g  Status:  Discontinued     2.25 g 100 mL/hr over 30 Minutes Intravenous Every 8 hours 09/05/15 1733 09/06/15 0935        Objective:   Vitals:   09/21/15 2039 09/22/15 0532 09/22/15 0635 09/22/15 1129  BP: 105/77 104/77    Pulse: (!) 118 (!) 120    Resp: 18 20    Temp: 98.5 F (36.9 C) 100.3 F (37.9 C) 99.6 F (37.6 C)   TempSrc: Oral Oral Oral   SpO2: 91% 93%    Weight:    58.2 kg (128 lb 4.9 oz)  Height:        Wt Readings from Last 3 Encounters:  09/22/15 58.2 kg (128 lb 4.9 oz)  09/05/15 66.4 kg (146 lb 6.2 oz)  07/10/15 88.8 kg (195 lb 12.8 oz)     Intake/Output Summary (Last 24 hours) at 09/22/15 1351 Last data filed at 09/22/15 1021  Gross per 24 hour  Intake              150 ml  Output             1125 ml  Net             -975 ml     Physical Exam  Gen:  poorly verbal, not in distress HEENT:  moist mucosa, supple neck Chest: clear b/l, no added sounds CVS: N S1&S2, no murmurs, GI: soft, NT, ND, BS+, left-sided nephrostomy tube draining clear urine Musculoskeletal: warm, no edema CNS: Alert and awake, poorly communicative, nonfocal    Data Review:    CBC  Recent Labs Lab 09/16/15 0450 09/17/15 0500 09/18/15 0535 09/19/15 0507 09/21/15 0834  WBC 11.1* 11.6* 7.9 6.9 5.8  HGB 7.6* 7.7* 7.1* 6.7* 8.5*  HCT 23.5* 24.6* 23.5* 21.9* 26.8*  PLT 214 272 243 227 210  MCV 81.6 81.5 83.0 83.9 83.8  MCH 26.4 25.5* 25.1* 25.7* 26.6  MCHC 32.3 31.3 30.2 30.6 31.7  RDW 17.2* 17.4* 17.4* 17.7* 16.5*  LYMPHSABS  --   --  1.5 1.5  --   MONOABS  --   --  0.6 0.5  --   EOSABS  --   --  0.2 0.2  --   BASOSABS  --   --  0.0 0.0  --     Chemistries   Recent Labs Lab 09/16/15 0450 09/17/15 0500 09/18/15 0535 09/19/15 0507 09/21/15 0834  NA 151* 152* 157* 148* 148*  K 3.4* 3.1* 3.9 3.5 3.5  CL 120* 119* 123* 117* 118*  CO2  _0 GLUCOSE 93 77 225* 94 167*  BUN 40* 42* 40* 35* 32*  CREATININE 1.51* 1.56* 1.50* 1.15 1.06  CALCIUM 8.2* 8.3* 8.5* 8.1* 8.5*  MG 2.0  --  2.3 2.2  --    ------------------------------------------------------------------------------------------------------------------ No results for input(s): CHOL, HDL, LDLCALC, TRIG, CHOLHDL, LDLDIRECT in the last 72 hours.  No results found for: HGBA1C ------------------------------------------------------------------------------------------------------------------ No results for input(s): TSH, T4TOTAL, T3FREE, THYROIDAB in the last 72 hours.  Invalid input(s): FREET3 ------------------------------------------------------------------------------------------------------------------ No results for input(s): VITAMINB12, FOLATE, FERRITIN, TIBC, IRON, RETICCTPCT in the last  72 hours.  Coagulation profile No results for input(s): INR, PROTIME in the last 168 hours.  No results for input(s): DDIMER in the last 72 hours.  Cardiac Enzymes No results for input(s): CKMB, TROPONINI, MYOGLOBIN in the last 168 hours.  Invalid input(s): CK ------------------------------------------------------------------------------------------------------------------ No results found for: BNP  Inpatient Medications  Scheduled Meds: . bethanechol  5 mg Oral TID  . chlorhexidine gluconate (SAGE KIT)  15 mL Mouth Rinse BID  . insulin aspart  0-20 Units Subcutaneous TID AC & HS  . lamoTRIgine  100 mg Oral BID  . levETIRAcetam  500 mg Oral BID  . meropenem (MERREM) IV  1 g Intravenous Q8H  . phenytoin  300 mg Oral QHS  . QUEtiapine  200 mg Oral BID  . sertraline  50 mg Oral Daily   Continuous Infusions:  PRN Meds:.acetaminophen, RESOURCE THICKENUP CLEAR  Micro Results Recent Results (from the past 240 hour(s))  Culture, respiratory (NON-Expectorated)     Status: None   Collection Time: 09/13/15 10:49 AM  Result Value Ref Range Status   Specimen  Description ENDOTRACHEAL  Final   Special Requests NONE  Final   Gram Stain   Final    FEW WBC PRESENT, PREDOMINANTLY PMN MODERATE GRAM NEGATIVE RODS RARE GRAM POSITIVE COCCI IN PAIRS Performed at [facility]  Final   Report Status 09/16/2015 FINAL  Final   Organism ID, Bacteria PSEUDOMONAS AERUGINOSA  Final      Susceptibility   Pseudomonas aeruginosa - MIC*    CEFTAZIDIME 16 INTERMEDIATE Intermediate     CIPROFLOXACIN <=0.25 SENSITIVE Sensitive     GENTAMICIN <=1 SENSITIVE Sensitive     IMIPENEM 2 SENSITIVE Sensitive     CEFEPIME 4 INTERMEDIATE Intermediate     * ABUNDANT PSEUDOMONAS AERUGINOSA    Radiology Reports Ct Abdomen Pelvis Wo Contrast  Result Date: 09/05/2015 CLINICAL DATA:  Left-sided hydronephrosis EXAM: CT ABDOMEN AND PELVIS WITHOUT CONTRAST TECHNIQUE: Multidetector CT imaging of the abdomen and pelvis was performed following the standard protocol without IV contrast. COMPARISON:  Renal ultrasound - 09/04/2015 FINDINGS: The lack of intravenous contrast limits the ability to evaluate solid abdominal organs. Examination is further degraded secondary to patient respiratory artifact. Lower chest: Limited visualization of the lower thorax demonstrates dependent subpleural ground-glass atelectasis. Airspace opacities are seen within the right lower lobe with associated air bronchograms. Additionally, there are ill-defined nodules seen within the right lower lobe which appear to demonstrate a tree-in-bud pattern. No pleural effusion. Normal heart size. No pericardial effusion. Decreased attenuation of the intra cardiac blood pool suggestive of anemia. Hepatobiliary: Normal hepatic contour. The gallbladder appears filled with radiopaque material suggestive of sludge. No definite ascites. Pancreas: Normal noncontrast appearance of the pancreas Spleen: Normal noncontrast appearance of the spleen. Incidental common of a small splenule.  Adrenals/Urinary Tract: Note is made of a punctate (approximately 0.5 x 0.6 cm) stone within the superior aspects of the left ureter (axial image 55, series 2; coronal image 64, series 602) which results in mild upstream ureterectasis and asymmetric left-sided pelvicaliectasis. No additional evidence of left-sided nephrolithiasis. Solitary punctate (approximately 2 mm) nonobstructing stone within the superior pole of the right kidney (image 40, series 2). No evidence of right-sided urinary obstruction. No renal stones are seen along expected course of the right ureter. The urinary bladder is decompressed with a Foley catheter. Several prominent phleboliths are seen within the left hemipelvis. Stomach/Bowel: A rectal catheter is in place. There is diffuse  thickening of the rectal wall, potentially reactive. Moderate colonic stool burden without evidence of enteric obstruction. Normal noncontrast appearance of the terminal ileum. The appendix is not visualized, however there is no asymmetric right-sided very cecal inflammatory change. No pneumoperitoneum, pneumatosis or portal venous gas. Vascular/Lymphatic: Minimal amount of atherosclerotic plaque within a normal caliber abdominal aorta. A central venous catheter tip terminates within the peripheral aspects of the left common iliac vein. No definitive bulky retroperitoneal, mesenteric, pelvic or inguinal lymphadenopathy on this noncontrast examination. Reproductive: No evidence approximately on this noncontrast examination. No free fluid in the pelvic cul-de-sac. Other: Mild diffuse body wall anasarca. Musculoskeletal: No acute or aggressive osseous abnormalities. Stigmata of DISH within the thoracic spine. IMPRESSION: 1. Punctate (approximately 0.6 cm) stone within the superior aspect of the left ureter results in very mild upstream left-sided ureterectasis and pelvicaliectasis. 2. Additional solitary punctate (approximately 2 mm) nonobstructing right-sided renal  stone. No evidence of right-sided urinary obstruction. 3. Bibasilar atelectasis with potential developing airspace opacity within the right lower lobe. Clinical correlation is advised. 4. Diffuse thickening of the rectal wall, potentially reactive secondary to placement of a rectal catheter though an enteritis could have a similar appearance. Clinical correlation is advised. No evidence of enteric obstruction. Electronically Signed   By: Sandi Mariscal M.D.   On: 09/05/2015 21:21  Dg Chest 1 View  Result Date: 09/05/2015 CLINICAL DATA:  Unsuccessful central line placement. EXAM: CHEST 1 VIEW COMPARISON:  Radiograph of same day. FINDINGS: The heart size and mediastinal contours are within normal limits. No pleural effusion is noted. Right lung is clear. Possible pleural line is seen in left lung apex which may represent small apical pneumothorax. The visualized skeletal structures are unremarkable. IMPRESSION: Possible small left apical pneumothorax. Follow-up radiographs are recommended. Critical Value/emergent results were called by telephone at the time of interpretation on 09/05/2015 at 1:34 pm to Lindwood Qua, nurse practitioner, who verbally acknowledged these results and will immediately contact Dr. Ashby Dawes. Electronically Signed   By: Marijo Conception, M.D.   On: 09/05/2015 13:36  Dg Abd 1 View  Result Date: 09/16/2015 CLINICAL DATA:  55 year old male with respiratory distress. Re intubated. Initial encounter. EXAM: ABDOMEN - 1 VIEW COMPARISON:  09/09/2015. Portable chest from today reported separately. FINDINGS: Portable AP supine view at 1404 hours. Enteric tube side hole at the distal thoracic esophagus level. Advance 8 cm to place this within the stomach. Stable left percutaneous nephrostomy tube. Gas-filled but nondilated bowel loops in the visible abdomen. IMPRESSION: 1. Enteric tube side hole of the distal thoracic esophagus, advance 8 cm to place the side hole within the stomach. 2.  Increased abdominal bowel gas, might reflect ileus. 3. Left side percutaneous nephrostomy appear stable. Electronically Signed   By: Genevie Ann M.D.   On: 09/16/2015 14:27   US Renal  Result Date: 09/04/2015 CLINICAL DATA:  Acute renal failure EXAM: RENAL / URINARY TRACT ULTRASOUND COMPLETE COMPARISON:  None. FINDINGS: Right Kidney: Length: 12.7 cm.  Slight increased cortical echogenicity Left Kidney: Length: 12.9 cm.  Mild hydronephrosis Bladder: The bladder is decompressed with a Foley catheter IMPRESSION: 1. Mild hydronephrosis on the left, age indeterminate. If there is concern for acute obstruction, CT scan may better evaluate. Electronically Signed   By: Dorise Bullion III M.D   On: 09/04/2015 13:50  Dg Chest Port 1 View  Result Date: 09/19/2015 CLINICAL DATA:  Intubation. EXAM: PORTABLE CHEST 1 VIEW COMPARISON:  09/17/2015. FINDINGS: Interim extubation. Interim removal of NG tube. Left subclavian  line stable position. Heart size stable. Bibasilar infiltrates and/or edema again noted without interim change . No pleural effusion or pneumothorax. IMPRESSION: 1. Interim removal of endotracheal tube and NG tube. Left subclavian line in stable position. 2. Persistent bibasilar infiltrates and or edema noted. No interim change. Electronically Signed   By: Marcello Moores  Register   On: 09/19/2015 07:03   Dg Chest Port 1 View  Result Date: 09/17/2015 CLINICAL DATA:  Acute hypoxemic respiratory failure EXAM: PORTABLE CHEST 1 VIEW COMPARISON:  09/16/2015 FINDINGS: Endotracheal tube, NG tube, and LEFT central venous line are unchanged. Normal cardiac silhouette. Mild central venous congestion is improved. No pneumothorax. IMPRESSION: 1. Improvement in central venous congestion. 2. Stable support apparatus. Electronically Signed   By: Suzy Bouchard M.D.   On: 09/17/2015 07:21   Dg Chest Port 1 View  Result Date: 09/16/2015 CLINICAL DATA:  55 year old male with respiratory distress. Re intubated. Initial encounter.  EXAM: PORTABLE CHEST 1 VIEW COMPARISON:  0819 hours today and earlier. FINDINGS: Portable AP supine view at 1359 hours. Endotracheal tube appears appropriately placed, tip just below the clavicles. Enteric tube in place, but side hole the level of the distal thoracic esophagus. Recommend advancing 8 cm to place the side hole within the stomach. Stable left subclavian central line. Mildly lower lung volumes. Continued perihilar and basilar reticulonodular opacity in both lungs. No pneumothorax or pleural effusion identified. Mediastinal contours remain within normal limits. Mild gaseous distension of bowel in the upper abdomen. IMPRESSION: 1. Endotracheal tube in good position. Enteric tube side hole at the distal thoracic esophagus, advanced 8 cm to place the side hole within the stomach. 2. Stable ventilation, bilateral reticulonodular opacity with top differential considerations of interstitial edema versus viral/atypical infection. Electronically Signed   By: Genevie Ann M.D.   On: 09/16/2015 14:26   Dg Chest Port 1 View  Result Date: 09/16/2015 CLINICAL DATA:  Hypoxia EXAM: PORTABLE CHEST 1 VIEW COMPARISON:  Study obtained earlier in the day and September 15, 2015 FINDINGS: Endotracheal tube tip is 9.7 cm above the carina. Central catheter tip is in the superior cava. Nasogastric tube tip and side port below the diaphragm. No pneumothorax. There is persistent hazy opacity in the right base. There is mild patchy opacity in the left base, slightly less than 1 day prior duct stable compared to earlier in the day. Heart size and pulmonary vascularity are normal. No adenopathy. IMPRESSION: Tube and catheter positions as described without pneumothorax. It may be prudent to consider advancing endotracheal tube approximately 4 cm given its rather high position at the level of T1. Patchy infiltrate in both lung bases, more on the right than on the left, stable on the right and slightly decreased on the left compared to 1 day  prior. No new opacity. No change in cardiac silhouette. These results will be called to the ordering clinician or representative by the Radiologist Assistant, and communication documented in the PACS or zVision Dashboard. Electronically Signed   By: Lowella Grip III M.D.   On: 09/16/2015 08:35   Dg Chest Port 1 View  Result Date: 09/16/2015 CLINICAL DATA:  Respiratory failure EXAM: PORTABLE CHEST 1 VIEW COMPARISON:  Yesterday FINDINGS: Tracheal extubation. A nasogastric tube is in good position. Left subclavian central line with tip at the distal SVC. History of pneumonia with stable bilateral lung opacities. No edema, effusion, or visible pneumothorax. Normal heart size. IMPRESSION: Stable inflation after extubation. History of pneumonia with stable bilateral opacity. Electronically Signed   By: Angelica Chessman  Watts M.D.   On: 09/16/2015 07:06   Dg Chest Port 1 View  Result Date: 09/15/2015 CLINICAL DATA:  Acute respiratory failure, intubated patient, aspiration pneumonia, acute renal failure EXAM: PORTABLE CHEST 1 VIEW COMPARISON:  Portable chest x-ray of September 14, 2015 FINDINGS: The lungs are well-expanded. There is persistent interstitial and linear alveolar opacity in the mid and lower right lung. There is increased interstitial density in the left infrahilar region likely in the lower lobe as well. There is no significant pleural effusion. There is no pneumothorax. The heart and pulmonary vascularity are normal. The endotracheal tube tip lies 3.6 cm above the carina. The esophagogastric tube tip projects below the inferior margin of the image. The left subclavian catheter tip projects over the midportion of the SVC. IMPRESSION: Slight interval worsening of bilateral lower lobe infiltrates consistent with pneumonia. The support tubes are in stable position. Electronically Signed   By: David  Martinique M.D.   On: 09/15/2015 07:43   Dg Chest Port 1 View  Result Date: 09/14/2015 CLINICAL DATA:  Respiratory  failure, intubated patient, healthcare associated pneumonia, acute renal failure. EXAM: PORTABLE CHEST 1 VIEW COMPARISON:  Portable chest x-ray of September 12, 2015 FINDINGS: The lungs are well-expanded. There is stable alveolar opacity in the right lower lung. The left lung exhibits minimal interstitial prominence adjacent to the heart border which is stable. The heart and pulmonary vascularity are normal. The endotracheal tube tip projects 4.4 cm above the carina. The esophagogastric tube tip in proximal port lie below the inferior margin of the image. The left subclavian venous catheter tip projects over the midportion of the SVC. IMPRESSION: Persistent basilar interstitial and alveolar infiltrates, stable. The support tubes are in reasonable position. Electronically Signed   By: David  Martinique M.D.   On: 09/14/2015 07:03   Dg Chest Port 1 View  Result Date: 09/12/2015 CLINICAL DATA:  Acute respiratory failure EXAM: PORTABLE CHEST 1 VIEW COMPARISON:  09/10/2015 FINDINGS: Endotracheal tube, nasogastric catheter left subclavian central line are again seen and stable. Patchy infiltrative changes are noted in the bases right greater than left increased from the prior exam. No effusion or pneumothorax is seen. No bony abnormality is noted. IMPRESSION: Increasing bibasilar infiltrates. Electronically Signed   By: Inez Catalina M.D.   On: 09/12/2015 12:55   Dg Chest Port 1 View  Result Date: 09/10/2015 CLINICAL DATA:  Acute respiratory failure EXAM: PORTABLE CHEST 1 VIEW COMPARISON:  September 09, 2015 FINDINGS: The NG tube terminates below today's film. Support apparatus is otherwise stable. No pneumothorax. No pulmonary nodules or masses. Focal opacity in the medial right lung base persists. Recommend follow-up to resolution. IMPRESSION: 1. Stable support apparatus. 2. Persistent focal opacity in the medial right lung base. Electronically Signed   By: Dorise Bullion III M.D   On: 09/10/2015 07:37  Portable Chest  Xray  Result Date: 09/09/2015 CLINICAL DATA:  Intubation. EXAM: PORTABLE CHEST 1 VIEW COMPARISON:  09/08/2015. FINDINGS: Endotracheal tube and NG tube in stable position. Left subclavian line in stable position. Right lower lobe infiltrate consistent pneumonia. Heart size normal. Small right pleural effusion cannot be excluded. No pneumothorax . IMPRESSION: 1. Lines and tubes in stable position. 2. Right lower lobe infiltrate consistent with pneumonia. Slight interim progression from prior exam . Small right pleural effusion cannot be exclude. Electronically Signed   By: Marcello Moores  Register   On: 09/09/2015 06:45  Dg Chest Port 1 View  Result Date: 09/08/2015 CLINICAL DATA:  Endotracheal placement. EXAM: PORTABLE CHEST  1 VIEW COMPARISON:  09/06/2015 FINDINGS: Endotracheal tube has its tip 5 cm above the carina. Nasogastric tube enters the stomach. Left subclavian central line has its tip in the SVC above the right atrium. Heart size is normal. The left lung is clear. There is infiltrate in the right lower lobe. IMPRESSION: Lines and tubes well positioned. Endotracheal tube 5 cm above the carina. Right lower lobe pneumonia persists. Electronically Signed   By: Nelson Chimes M.D.   On: 09/08/2015 13:34  Dg Chest Port 1 View  Result Date: 09/06/2015 CLINICAL DATA:  Pneumothorax. EXAM: PORTABLE CHEST 1 VIEW COMPARISON:  09/05/2015 . FINDINGS: Mediastinum hilar structures normal. Mild right base subsegmental atelectasis and or infiltrate. Small right pleural effusion. Heart size normal. IMPRESSION: Mild right base subsegmental atelectasis and or infiltrate. Right base pneumonia cannot be excluded. Similar finding noted on prior exam. Chest is otherwise stable. No pneumothorax. Electronically Signed   By: Marcello Moores  Register   On: 09/06/2015 07:15  Dg Chest Port 1 View  Result Date: 09/05/2015 CLINICAL DATA:  Chart states that there have been several attempts to place a "left" side IJ PICC line and there was  concern for a possible pneumothorax. Pt has a medium sized bandage on right side of neck, suggesting that IJ PICC was attempted on right side. Ordering MD looked at image outside of patient's room directly after I took it. Hx of diabetes. Pt was not able to verbally respond. EXAM: PORTABLE CHEST 1 VIEW COMPARISON:  09/05/2015 at 12:52 a.m. FINDINGS: There is now no evidence of a left pneumothorax. The previously seen linear opacity near the left apex was likely an artifact. Mild opacity at the right lung base is consistent with atelectasis. Consider pneumonia if there are consistent clinical symptoms. Lungs otherwise clear. No pleural effusion. Heart, mediastinum and hila are unremarkable. IMPRESSION: 1. No evidence of a pneumothorax on the current study. 2. Right lung base opacity most likely atelectasis. Pneumonia is possible. Electronically Signed   By: Lajean Manes M.D.   On: 09/05/2015 17:38  Portable Chest 1 View  Result Date: 09/05/2015 CLINICAL DATA:  Pneumonia EXAM: PORTABLE CHEST 1 VIEW COMPARISON:  September 04, 2015 FINDINGS: There is patchy airspace consolidation in the right base. There is new mild atelectatic change in the left base, possibly represent a second focus of early pneumonia. Lungs elsewhere clear. Heart is upper normal in size with pulmonary vascularity within normal limits. No adenopathy. No bone lesions. IMPRESSION: Patchy airspace opacity in the right apex consistent with a degree of pneumonia. Atelectasis is in the left lower lobe, new ; question a second focus of pneumonia developing in the left base. Lungs elsewhere clear. Stable cardiac silhouette. Electronically Signed   By: Lowella Grip III M.D.   On: 09/05/2015 07:15  Dg Chest Port 1 View  Result Date: 09/04/2015 CLINICAL DATA:  Fever, probable sepsis, diabetic. Pt unable to give hx at this time EXAM: PORTABLE CHEST 1 VIEW COMPARISON:  07/10/2015 FINDINGS: Midline trachea. Normal heart size. Numerous leads and wires  project over the chest. No pleural effusion or pneumothorax. Mild medial right lung base airspace disease is suspected. Clear left lung. IMPRESSION: Subtle medial right lung airspace disease. Although this could represent atelectasis, given the clinical history, suspicious for early infection. If possible, PA and lateral radiographs should be considered. Electronically Signed   By: Abigail Miyamoto M.D.   On: 09/04/2015 11:30  Dg Abd Portable 1v  Result Date: 09/09/2015 CLINICAL DATA:  OG tube placement. EXAM:  PORTABLE ABDOMEN - 1 VIEW COMPARISON:  None. FINDINGS: OG tube is in place with the tip in the distal stomach. Nephrostomy on the left tube is noted. IMPRESSION: As above. Electronically Signed   By: Inge Rise M.D.   On: 09/09/2015 13:41  Ir Nephrostomy Placement Left  Result Date: 09/06/2015 INDICATION: Left hydronephrosis, urosepsis and left ureteral calculus. EXAM: IR NEPHROSTOMY PLACEMENT LEFT COMPARISON:  CT of the abdomen and pelvis on 09/05/2015 MEDICATIONS: 2 g IV Ancef. As antibiotic prophylaxis, Ancef was ordered pre-procedure and administered intravenously within one hour of incision. ANESTHESIA/SEDATION: Fentanyl 75 mcg IV; Versed 2.0 mg IV Moderate Sedation Time:  13 minutes. The patient was continuously monitored during the procedure by the interventional radiology nurse under my direct supervision. CONTRAST:  10 mL Isovue-300 - administered into the collecting system(s) FLUOROSCOPY TIME:  Fluoroscopy Time: 2 minutes and 54 seconds. COMPLICATIONS: None immediate. PROCEDURE: Informed written consent was obtained from the patient's sister after a thorough discussion of the procedural risks, benefits and alternatives. All questions were addressed. Maximal Sterile Barrier Technique was utilized including caps, mask, sterile gowns, sterile gloves, sterile drape, hand hygiene and skin antiseptic. A timeout was performed prior to the initiation of the procedure. Ultrasound was used to  localize the left kidney. Under direct ultrasound guidance, a 21 gauge needle was advanced into the lower pole collecting system. Contrast was injected after aspiration of urine. A guidewire was advanced. A transitional dilator was placed. The tract was dilated over a guidewire. A 10 French nephrostomy tube was advanced. Tube position was confirmed by fluoroscopy. The catheter was connected to a gravity drainage bag. It was secured at the skin with a Prolene retention suture and StatLock device. FINDINGS: Ultrasound shows mild left-sided hydronephrosis. After nephrostomy tube placement, there was some bloody in urine return related to bleeding in the collecting system with tube advancement. Due to small size of the renal pelvis, the nephrostomy tube could not be completely formed and was left partially extending into the proximal ureter. IMPRESSION: Left percutaneous nephrostomy tube placement with a 10 French catheter placed and advanced into the proximal ureter. This will be left to gravity bag drainage. Electronically Signed   By: Aletta Edouard M.D.   On: 09/06/2015 17:04   Time Spent in minutes  25   Louellen Molder M.D on 09/22/2015 at 1:51 PM  Between 7am to 7pm - Pager - [phone]  After 7pm go to [email or web address] - password [facility]  [facility] -  Office  [phone]    google

## 2015-09-22 NOTE — Progress Notes (Signed)
LCSWA spoke with patient sister Selinda OrionJudy Casselman, and discussed patient disposition to [facility], to continue physical therapy.

## 2015-09-22 NOTE — Progress Notes (Signed)
Pharmacy Antibiotic Note  Cory Salazar is a 55 y.o. male admitted on 09/05/2015 who now has VDRF and pneumonia.  He is currently receiving ceftazidime, but sputum culture is growing Pseudomonas aeruginosa reported as I to ceftazidime and cefepime, S to ciprofloxacin, gentamicin, and imipenem.   Given patient's history of seizure disorder meropenem would be preferable over imipenem for carbapenem therapy.  Today, 09/22/2015: Day #7 meropenem  WBC WNL  Afebrile  SCr improved  No seizure activity noted  Plan:  Continue meropenem 1 gram IV q8h. Follow length of therapy.  New guidelines suggest 7 day course therapy for PsA PNA.  Consider completing 7 days (vs 10 days) therapy as clinically warranted.   Height: 6\' 1"  (185.4 cm) Weight: 133 lb 6.1 oz (60.5 kg) IBW/kg (Calculated) : 79.9  Temp (24hrs), Avg:99.3 F (37.4 C), Min:98.5 F (36.9 C), Max:100.3 F (37.9 C)   Recent Labs Lab 09/16/15 0450 09/17/15 0500 09/18/15 0535 09/19/15 0507 09/21/15 0834  WBC 11.1* 11.6* 7.9 6.9 5.8  CREATININE 1.51* 1.56* 1.50* 1.15 1.06    Estimated Creatinine Clearance: 69 mL/min (by C-G formula based on SCr of 1.06 mg/dL).    No Known Allergies  Antimicrobials this admission: 7/23 Vancomycin >> 7/25 7/23 Zosyn >> 7/25 7/25 Augmentin >> 7/26 7/26 Unasyn >> 8/1 8/1 Ceftazidime >>8/4 8/4 Meropenem >>  Dose adjustments this admission: n/a  Microbiology results: 7/23 BCx: NGTD 7/23 UCx: NGF  7/23 MRSA PCR: negative 7/24 CDiff antigen / toxin negative 7/24 GI panel negative 8/1 ETT: abundant P.aeruginosa, I to ceftaz, cefepime;  S to ciproflox, gent, imipenem (using meropenem d/t patient's h/o seizure d/o)   Thank you for allowing pharmacy to be a part of this patient's care.  Cory Salazar, PharmD, BCPS 09/22/2015, 8:04 AM  Pager: [phone]

## 2015-09-22 NOTE — Progress Notes (Signed)
Patient ID: Cory Salazar, male   DOB: 1960/11/28, 55 y.o.   MRN: [id number] Will plan to internalize drain as an outpatient as we are unable to do this today and patient is going to SNF tomorrow.  Order has been placed.  Knut Rondinelli E

## 2015-09-22 NOTE — Evaluation (Signed)
Clinical/Bedside Swallow Evaluation Patient Details  Name: Cory Salazar MRN: [id number] Date of Birth: Aug 01, 1960  Today's Date: 09/22/2015 Time: SLP Start Time (ACUTE ONLY): 1316 SLP Stop Time (ACUTE ONLY): 1335 SLP Time Calculation (min) (ACUTE ONLY): 19 min  Past Medical History:  Past Medical History:  Diagnosis Date  . Anxiety disorder   . Diabetes mellitus without complication (HCC)   . Encephalopathy   . MDD (major depressive disorder) (HCC)   . Seizure [facility](HCC)    Past Surgical History:  Past Surgical History:  Procedure Laterality Date  . IR GENERIC HISTORICAL  09/06/2015   IR NEPHROSTOMY PLACEMENT LEFT 09/06/2015 Irish LackGlenn Yamagata, MD [facility]-INTERV RAD   HPI:  55 year old male admitted 09/05/15 due to sepsis. PMH significant for Brain cancer, encephalopathy, AKI, fever, UTI, right sided pna. Pt evaluated by SLP on 7/27, foudn to be severey impaired, no PO given. Pt was intubated later that day until 8/7. Now a DNR, on dys 1/thin diet.    Assessment / Plan / Recommendation Clinical Impression  Pt demosntrates ongoing signs of a severe oropharyngeal dysphagia. Initially pt took trials of nectar thick liquids with weak, delayed swallow, but no immediate signs of aspiration. After several sips on nectar and bites of pudding were given, pts respirations began to sound wet and pt began coughing weakly and unproductively. Given delayed, weak swallow strongly suspect decreased sensation, pharyngeal residuals, and inabilty to protect airway putting pt at high risk for aspiration. Given that pts family has determined that they do not want him to have a feeding tube, would recommend pt continue a modified diet (dys 1/nectar), though plan of care should reflect what is likely a profound chronic dysphagia. Will follow for education and modification, though prognosis for "tolerance" very poor.     Aspiration Risk  Severe aspiration risk;Risk for inadequate nutrition/hydration    Diet Recommendation  Dysphagia 1 (Puree);Nectar-thick liquid   Liquid Administration via: Spoon Medication Administration: Crushed with puree Compensations: Slow rate Postural Changes: Seated upright at 90 degrees    Other  Recommendations Oral Care Recommendations: Oral care QID Other Recommendations: Have oral suction available   Follow up Recommendations  Skilled Nursing facility    Frequency and Duration min 2x/week  1 week       Prognosis Prognosis for Safe Diet Advancement: Guarded Barriers to Reach Goals: Cognitive deficits;Severity of deficits      Swallow Study   General HPI: 55 year old male admitted 09/05/15 due to sepsis. PMH significant for Brain cancer, encephalopathy, AKI, fever, UTI, right sided pna. Pt evaluated by SLP on 7/27, foudn to be severey impaired, no PO given. Pt was intubated later that day until 8/7. Now a DNR, on dys 1/thin diet.  Type of Study: Bedside Swallow Evaluation Diet Prior to this Study: Dysphagia 1 (puree);Thin liquids Temperature Spikes Noted: Yes Respiratory Status: Room air History of Recent Intubation: Yes Length of Intubations (days): 12 days Date extubated: 09/19/15 Behavior/Cognition: Alert;Cooperative;Requires cueing Oral Care Completed by SLP: Yes Self-Feeding Abilities: Total assist Patient Positioning: Upright in bed Baseline Vocal Quality: Hoarse;Breathy Volitional Cough: Cognitively unable to elicit Volitional Swallow: Unable to elicit    Oral/Motor/Sensory Function     Ice Chips     Thin Liquid Thin Liquid: Not tested    Nectar Thick Nectar Thick Liquid: Impaired Presentation: Straw;Cup;Spoon Oral phase functional implications: Prolonged oral transit Pharyngeal Phase Impairments: Wet Vocal Quality;Suspected delayed Swallow;Decreased hyoid-laryngeal movement;Cough - Delayed   Honey Thick Honey Thick Liquid: Not tested   Puree  Puree: Impaired Presentation: Spoon Oral Phase Functional Implications: Prolonged oral transit Pharyngeal  Phase Impairments: Suspected delayed Swallow;Decreased hyoid-laryngeal movement;Wet Vocal Quality;Cough - Delayed   Solid   GO   Solid: Not tested       Harlon Ditty, MA CCC-SLP [phone]  Adelise Buswell, Riley Nearing 09/22/2015,1:52 PM

## 2015-09-23 ENCOUNTER — Inpatient Hospital Stay (HOSPITAL_COMMUNITY): Payer: Medicare Other

## 2015-09-23 ENCOUNTER — Encounter (HOSPITAL_COMMUNITY): Payer: Self-pay | Admitting: Internal Medicine

## 2015-09-23 DIAGNOSIS — N1330 Unspecified hydronephrosis: Secondary | ICD-10-CM

## 2015-09-23 DIAGNOSIS — A419 Sepsis, unspecified organism: Principal | ICD-10-CM

## 2015-09-23 DIAGNOSIS — J9601 Acute respiratory failure with hypoxia: Secondary | ICD-10-CM

## 2015-09-23 DIAGNOSIS — N139 Obstructive and reflux uropathy, unspecified: Secondary | ICD-10-CM

## 2015-09-23 DIAGNOSIS — R1310 Dysphagia, unspecified: Secondary | ICD-10-CM

## 2015-09-23 DIAGNOSIS — F0390 Unspecified dementia without behavioral disturbance: Secondary | ICD-10-CM | POA: Diagnosis present

## 2015-09-23 HISTORY — DX: Dysphagia, unspecified: R13.10

## 2015-09-23 LAB — GLUCOSE, CAPILLARY
GLUCOSE-CAPILLARY: 184 mg/dL — AB (ref 65–99)
GLUCOSE-CAPILLARY: 102 mg/dL — AB (ref 65–99)
Glucose-Capillary: 195 mg/dL — ABNORMAL HIGH (ref 65–99)
Glucose-Capillary: 129 mg/dL — ABNORMAL HIGH (ref 65–99)

## 2015-09-23 MED ORDER — LEVETIRACETAM 500 MG PO TABS: 500.0000 mg | ORAL_TABLET | Freq: Two times a day (BID) | ORAL | 0 refills | Status: AC

## 2015-09-23 MED ORDER — LEVOFLOXACIN 500 MG PO TABS: 500.0000 mg | ORAL_TABLET | Freq: Every day | ORAL | 0 refills | Status: DC

## 2015-09-23 MED ORDER — SODIUM CHLORIDE 0.9 % IV SOLN
INTRAVENOUS | Status: AC
  Administered 2015-09-23 – 2015-09-24 (×2): via INTRAVENOUS

## 2015-09-23 MED ORDER — RESOURCE THICKENUP CLEAR PO POWD: 1.0000 | ORAL | 0 refills | Status: AC | PRN

## 2015-09-23 MED ORDER — SODIUM CHLORIDE 0.9 % IV BOLUS (SEPSIS)
500.0000 mL | Freq: Once | INTRAVENOUS | Status: AC
  Administered 2015-09-23: 500 mL via INTRAVENOUS

## 2015-09-23 MED ORDER — INSULIN GLARGINE 100 UNIT/ML ~~LOC~~ SOLN: 10.0000 [IU] | Freq: Every day | SUBCUTANEOUS | 11 refills | Status: AC

## 2015-09-23 MED ORDER — BETHANECHOL CHLORIDE 5 MG PO TABS: 5.0000 mg | ORAL_TABLET | Freq: Three times a day (TID) | ORAL | 0 refills | Status: AC

## 2015-09-23 NOTE — Progress Notes (Signed)
Notified Chevis PrettyJudy Bird, HCPOA of patient's change of condition and that he will not be d/c'd today. Left number if she had any additional questions.

## 2015-09-23 NOTE — Clinical Social Work Placement (Addendum)
   CLINICAL SOCIAL WORK PLACEMENT  NOTE  Date:  09/24/2015  Patient Details  Name: Cory Salazar MRN: [id number] Date of Birth: January 27, 1961  Clinical Social Work is seeking post-discharge placement for this patient at the Skilled  Nursing Facility level of care (*CSW will initial, date and re-position this form in  chart as items are completed):      [facility]family provided with [facility]'s list of facilities offering this level of care within the geographic area requested by the patient (or if unable, by the patient's family).      Patient/family informed of their freedom to choose among providers that offer the needed level of care, that participate in Medicare, Medicaid or managed care program needed by the patient, have an available bed and are willing to accept the patient.      Patient/family informed of [facility]'s ownership interest in [facility] and [facility], as well as of the fact that they are under no obligation to receive care at these facilities.  PASRR submitted to EDS on       PASRR number received on       Existing PASRR number confirmed on 09/23/15     FL2 transmitted to all facilities in geographic area requested by pt/family on       FL2 transmitted to all facilities within larger geographic area on 09/23/15     Patient informed that his/her managed care company has contracts with or will negotiate with certain facilities, including the following:  [facility]     No   Patient/family informed of bed offers received.  Patient [facility] bed at  [facility]     Physician recommends and patient chooses bed at      Patient to be transferred to [facility] on 09/24/15.  Patient to be transferred to facility by [facility]     Patient family notified on 09/24/15 of transfer.  Name of family member notified:  Zella RicherJudy Bryd: [phone], Isaac LaudAudry Lilley: [phone]     PHYSICIAN       Additional  Comment:  Patient returning to [facility]   _______________________________________________ Clearance CootsNicole A Mariha Sleeper, LCSW 09/23/2015, 1:32 PM

## 2015-09-23 NOTE — Progress Notes (Addendum)
Pt's O2 sats dropped to 79%, BP low, Pulse 130s. Paged MD. New orders written, 4L O2 placed per MD and hold off d/c of patient.

## 2015-09-23 NOTE — Progress Notes (Addendum)
Patient desating to 79% on 2L, tachycardic to `130s. Soft BP. CXR shows increased bibasilar opacity. Suspect aspiration.  On empiric meropenem. Pt likely to continue to aspirate. Hold off on discharge. Given 500 cc NS bolus followed by maintenance fluid

## 2015-09-23 NOTE — Discharge Summary (Addendum)
Physician Discharge Summary  Cory Salazar RUE:[id number] DOB: 12-Jun-1960 DOA: 09/05/2015  PCP: No primary care provider on file.  Admit date: 09/05/2015 Discharge date: 09/24/2015  Admitted From: [facility] Disposition: Skilled nursing facility   Recommendations for Outpatient Follow-up:  1. Follow-up with M.D. at rehabilitation in 1 week.  completes antibiotic course on 8/18 2. Recommend palliative care evaluation at the facility. 3. Patient to follow-up with his urologist in [place] for definitive treatment for his nephrolithiasis and hydronephrosis.  Home Health: None Equipment/Devices: None  Discharge Condition: Guarded CODE STATUS: DO NOT RESUSCITATE Diet recommendation: Dysphagia level I with nectar thick liquid    Discharge Diagnoses:  Principal Problem:   Sepsis (HCC)   Active Problems:   Acute respiratory failure with hypoxia (HCC)   HCAP (healthcare-associated pneumonia)   ARF (acute renal failure) (HCC)   Seizure disorder (HCC)   Pressure ulcer   Hydronephrosis   Malnutrition of moderate degree   Aspiration pneumonia (HCC)   Dysphagia   Dementia without behavioral disturbance   Brief narrative/history of present illness Please refer to admission H&P for details, in brief, 55 year old male with history of dementia (after treatment for brain tumor in his 77s), was admitted to [facility] on 7/24 with acute kidney injury and fever. He was found to have nephrolithiasis with left-sided hydronephrosis and UTI. The hospital course was complicated with development of Healthcare associated pneumonia. He was transferred to [facility] long hospital for IR placement of nephrostomy tube.  ICU course SIGNIFICANT EVENTS: 7/24 - Admit to ICU w/ R Pneumonia & Acute Renal Failure on CRRT 7/25 - Perc nephrostomy tube placed 7/26 - Transferred to floor 7/27 - Transfer to ICU for acute hypoxic resp failure & increased work of breathing 8/04  - ETT changed due to cuff leak 8/7 - cuff leak. Extubated. Full DNR re-confirmed . 8/8-transfer to hospitalist service.  LINES/TUBES: L Fem CVL 7/24 - 7/26 L Perc Nephrostomy Tube 7/25 >> ETT 7.5 7/26 >>8/7 L Subclav CVL 7/27 >>8/8  STUDIES: Renal U/S 7/23: Lt hydronephrosis CT Abd/Pelvis 7/24:small superior uretal stone in with mild pelviectasis, additional non-obstructing stone on R   MICROBIOLOGY: MRSA PCR 7/23: Negative  Blood Ctx x2 7/23: Negative Urine Ctx 7/23: Negative  Tracheal Asp Ctx 8/1: Pseudomonas aeruginosa   ANTIBIOTICS: Zosyn 7/24 - 7/25 Vancomycin 7/25 Augmentin 7/25 - 7/26 Unasyn 7/25 - 7/31 Fortaz 8/2 - 8/4 Meropenem 8/4 >> 8/11 Oral Levaquin 8/12-8/18   Hospital course Principal problem Acute respiratory failure with hypoxia (HCC) Associated Pseudomonas tracheobronchitis. Continue pulmonary toilet. High risk for aspiration with dysphagia. Patient placed on meropenem. On 8/11, he was ready for discharge to SNF but became tachycardic to 130s and desated to 79% on 2L [place]. CXR done showed ioncreased bibasilar opacity. Suspect ongoing aspiration. Also BP was low with 80s systolic. Given 500cc NS bolus with improvement. Also required some suctioining. Overnight vitals stable and maintaining sats on 2-3 L. He is stable this am and can be dsicharged to SNF. He is at significant risk for aspiration. will be discharged on Levaquin to complete 14 day course of antibiotics on 8/18.  Active problems Obstructive uropathy Status post percutaneous nephrostomy tube placed by IR. Draining well. Urology recommends that he would need definitive treatment for his stone by urologist at [facility] (was followed by Dr. Apolinar Junes there). Recommend discharge to skilled nursing facility with nephrostomy tube. Recommend that internalization of the double-J stent may be helpful prior to discharge.  Discussed with IR who recommended  that this needs to be done under some  sedation and plan to do this as an outpatient once his current infection resolves and is more stable. There will arrange for outpatient appointment.  Dysphagia, oropharyngeal Patient found to have severe oropharyngeal dysphagia with high risk for aspiration. ICU team and myself discussed with patient's sister who refused against a PEG tube.  Recommend dysphagia level I diet with nectar thick liquid by speech pathology.   Diabetes mellitus type 2 with episode of hypoglycemia Lantus held and monitored on sliding scale coverage. CBG now 150-20. Using Lantus at a lower dose (10 units daily).  Seizure disorder Continue Keppra, Lamictal, Dilantin and Zoloft.  Moderate protein calorie malnutrition Appreciate nutrition consult. Continue supplement.   Failure to thrive Discussed overall prognosis and goals of care with patient's sister Darel Hong on the phone. She is not interested in palliative care discussion at this time. I have recommended palliative care evaluation at the nursing facility and she agrees.     Family Communication  : Discussed with sister on the phone.  Disposition Plan  : Skilled nursing facility     Consults  :   [facility] CM Urology IR  Discharge Instructions     Medication List    STOP taking these medications   levETIRAcetam 500 mg in sodium chloride 0.9 % 100 mL   LORazepam 2 MG/ML injection Commonly known as:  ATIVAN   piperacillin-tazobactam 3.375 GM/50ML IVPB Commonly known as:  ZOSYN   sodium chloride 0.45 % solution   sodium chloride flush 0.9 % Soln Commonly known as:  NS     TAKE these medications   acetaminophen 325 MG tablet Commonly known as:  TYLENOL Take 650 mg by mouth every 4 (four) hours as needed for mild pain, moderate pain or fever.   bethanechol 5 MG tablet Commonly known as:  URECHOLINE Take 1 tablet (5 mg total) by mouth 3 (three) times daily.   insulin glargine 100 UNIT/ML injection Commonly known as:   LANTUS Inject 0.1 mLs (10 Units total) into the skin at bedtime. What changed:  how much to take   ipratropium-albuterol 0.5-2.5 (3) MG/3ML Soln Commonly known as:  DUONEB Take 3 mLs by nebulization 4 (four) times daily.   lactulose 10 GM/15ML solution Commonly known as:  CHRONULAC Take 30 mLs (20 g total) by mouth 2 (two) times daily.   lamoTRIgine 100 MG tablet Commonly known as:  LAMICTAL Take 1 tablet (100 mg total) by mouth 2 (two) times daily.   levETIRAcetam 500 MG tablet Commonly known as:  KEPPRA Take 1 tablet (500 mg total) by mouth 2 (two) times daily.   levofloxacin 500 MG tablet Commonly known as:  LEVAQUIN Take 1 tablet (500 mg total) by mouth daily. Start taking on:  09/24/2015 until 09/30/2015   mirtazapine 15 MG tablet Commonly known as:  REMERON Take 1 tablet (15 mg total) by mouth at bedtime.   phenytoin 50 MG tablet Commonly known as:  DILANTIN Chew 6 tablets (300 mg total) by mouth at bedtime.   potassium chloride SA 20 MEQ tablet Commonly known as:  K-DUR,KLOR-CON Take 20 mEq by mouth 2 (two) times daily.   QUEtiapine 200 MG tablet Commonly known as:  SEROQUEL Take 200 mg by mouth 2 (two) times daily. **Medication on hold from 09/01/15 to 09/04/15 per MD at [facility]**   RESOURCE THICKENUP CLEAR Powd Take 120 g by mouth as needed.   sertraline 50 MG tablet Commonly known as:  ZOLOFT  Take 50 mg by mouth daily.   tamsulosin 0.4 MG Caps capsule Commonly known as:  FLOMAX Take 0.4 mg by mouth daily.       No Known Allergies  Consultations: PC CM Urology IR  Procedures/Studies: Ct Abdomen Pelvis Wo Contrast  Result Date: 09/05/2015 CLINICAL DATA:  Left-sided hydronephrosis EXAM: CT ABDOMEN AND PELVIS WITHOUT CONTRAST TECHNIQUE: Multidetector CT imaging of the abdomen and pelvis was performed following the standard protocol without IV contrast. COMPARISON:  Renal ultrasound - 09/04/2015 FINDINGS: The lack of intravenous contrast  limits the ability to evaluate solid abdominal organs. Examination is further degraded secondary to patient respiratory artifact. Lower chest: Limited visualization of the lower thorax demonstrates dependent subpleural ground-glass atelectasis. Airspace opacities are seen within the right lower lobe with associated air bronchograms. Additionally, there are ill-defined nodules seen within the right lower lobe which appear to demonstrate a tree-in-bud pattern. No pleural effusion. Normal heart size. No pericardial effusion. Decreased attenuation of the intra cardiac blood pool suggestive of anemia. Hepatobiliary: Normal hepatic contour. The gallbladder appears filled with radiopaque material suggestive of sludge. No definite ascites. Pancreas: Normal noncontrast appearance of the pancreas Spleen: Normal noncontrast appearance of the spleen. Incidental common of a small splenule. Adrenals/Urinary Tract: Note is made of a punctate (approximately 0.5 x 0.6 cm) stone within the superior aspects of the left ureter (axial image 55, series 2; coronal image 64, series 602) which results in mild upstream ureterectasis and asymmetric left-sided pelvicaliectasis. No additional evidence of left-sided nephrolithiasis. Solitary punctate (approximately 2 mm) nonobstructing stone within the superior pole of the right kidney (image 40, series 2). No evidence of right-sided urinary obstruction. No renal stones are seen along expected course of the right ureter. The urinary bladder is decompressed with a Foley catheter. Several prominent phleboliths are seen within the left hemipelvis. Stomach/Bowel: A rectal catheter is in place. There is diffuse thickening of the rectal wall, potentially reactive. Moderate colonic stool burden without evidence of enteric obstruction. Normal noncontrast appearance of the terminal ileum. The appendix is not visualized, however there is no asymmetric right-sided very cecal inflammatory change. No  pneumoperitoneum, pneumatosis or portal venous gas. Vascular/Lymphatic: Minimal amount of atherosclerotic plaque within a normal caliber abdominal aorta. A central venous catheter tip terminates within the peripheral aspects of the left common iliac vein. No definitive bulky retroperitoneal, mesenteric, pelvic or inguinal lymphadenopathy on this noncontrast examination. Reproductive: No evidence approximately on this noncontrast examination. No free fluid in the pelvic cul-de-sac. Other: Mild diffuse body wall anasarca. Musculoskeletal: No acute or aggressive osseous abnormalities. Stigmata of DISH within the thoracic spine. IMPRESSION: 1. Punctate (approximately 0.6 cm) stone within the superior aspect of the left ureter results in very mild upstream left-sided ureterectasis and pelvicaliectasis. 2. Additional solitary punctate (approximately 2 mm) nonobstructing right-sided renal stone. No evidence of right-sided urinary obstruction. 3. Bibasilar atelectasis with potential developing airspace opacity within the right lower lobe. Clinical correlation is advised. 4. Diffuse thickening of the rectal wall, potentially reactive secondary to placement of a rectal catheter though an enteritis could have a similar appearance. Clinical correlation is advised. No evidence of enteric obstruction. Electronically Signed   By: Simonne Come M.D.   On: 09/05/2015 21:21  Dg Chest 1 View  Result Date: 09/05/2015 CLINICAL DATA:  Unsuccessful central line placement. EXAM: CHEST 1 VIEW COMPARISON:  Radiograph of same day. FINDINGS: The heart size and mediastinal contours are within normal limits. No pleural effusion is noted. Right lung is clear. Possible pleural line  is seen in left lung apex which may represent small apical pneumothorax. The visualized skeletal structures are unremarkable. IMPRESSION: Possible small left apical pneumothorax. Follow-up radiographs are recommended. Critical Value/emergent results were called by  telephone at the time of interpretation on 09/05/2015 at 1:34 pm to Bennett Scrape, nurse practitioner, who verbally acknowledged these results and will immediately contact Dr. Nicholos Johns. Electronically Signed   By: Lupita Raider, M.D.   On: 09/05/2015 13:36  Dg Abd 1 View  Result Date: 09/16/2015 CLINICAL DATA:  55 year old male with respiratory distress. Re intubated. Initial encounter. EXAM: ABDOMEN - 1 VIEW COMPARISON:  09/09/2015. Portable chest from today reported separately. FINDINGS: Portable AP supine view at 1404 hours. Enteric tube side hole at the distal thoracic esophagus level. Advance 8 cm to place this within the stomach. Stable left percutaneous nephrostomy tube. Gas-filled but nondilated bowel loops in the visible abdomen. IMPRESSION: 1. Enteric tube side hole of the distal thoracic esophagus, advance 8 cm to place the side hole within the stomach. 2. Increased abdominal bowel gas, might reflect ileus. 3. Left side percutaneous nephrostomy appear stable. Electronically Signed   By: Odessa Fleming M.D.   On: 09/16/2015 14:27   US Renal  Result Date: 09/04/2015 CLINICAL DATA:  Acute renal failure EXAM: RENAL / URINARY TRACT ULTRASOUND COMPLETE COMPARISON:  None. FINDINGS: Right Kidney: Length: 12.7 cm.  Slight increased cortical echogenicity Left Kidney: Length: 12.9 cm.  Mild hydronephrosis Bladder: The bladder is decompressed with a Foley catheter IMPRESSION: 1. Mild hydronephrosis on the left, age indeterminate. If there is concern for acute obstruction, CT scan may better evaluate. Electronically Signed   By: Gerome Sam III M.D   On: 09/04/2015 13:50  Dg Chest Port 1 View  Result Date: 09/19/2015 CLINICAL DATA:  Intubation. EXAM: PORTABLE CHEST 1 VIEW COMPARISON:  09/17/2015. FINDINGS: Interim extubation. Interim removal of NG tube. Left subclavian line stable position. Heart size stable. Bibasilar infiltrates and/or edema again noted without interim change . No pleural effusion or  pneumothorax. IMPRESSION: 1. Interim removal of endotracheal tube and NG tube. Left subclavian line in stable position. 2. Persistent bibasilar infiltrates and or edema noted. No interim change. Electronically Signed   By: Maisie Fus  Register   On: 09/19/2015 07:03   Dg Chest Port 1 View  Result Date: 09/17/2015 CLINICAL DATA:  Acute hypoxemic respiratory failure EXAM: PORTABLE CHEST 1 VIEW COMPARISON:  09/16/2015 FINDINGS: Endotracheal tube, NG tube, and LEFT central venous line are unchanged. Normal cardiac silhouette. Mild central venous congestion is improved. No pneumothorax. IMPRESSION: 1. Improvement in central venous congestion. 2. Stable support apparatus. Electronically Signed   By: Genevive Bi M.D.   On: 09/17/2015 07:21   Dg Chest Port 1 View  Result Date: 09/16/2015 CLINICAL DATA:  55 year old male with respiratory distress. Re intubated. Initial encounter. EXAM: PORTABLE CHEST 1 VIEW COMPARISON:  0819 hours today and earlier. FINDINGS: Portable AP supine view at 1359 hours. Endotracheal tube appears appropriately placed, tip just below the clavicles. Enteric tube in place, but side hole the level of the distal thoracic esophagus. Recommend advancing 8 cm to place the side hole within the stomach. Stable left subclavian central line. Mildly lower lung volumes. Continued perihilar and basilar reticulonodular opacity in both lungs. No pneumothorax or pleural effusion identified. Mediastinal contours remain within normal limits. Mild gaseous distension of bowel in the upper abdomen. IMPRESSION: 1. Endotracheal tube in good position. Enteric tube side hole at the distal thoracic esophagus, advanced 8 cm to place the  side hole within the stomach. 2. Stable ventilation, bilateral reticulonodular opacity with top differential considerations of interstitial edema versus viral/atypical infection. Electronically Signed   By: Odessa Fleming M.D.   On: 09/16/2015 14:26   Dg Chest Port 1 View  Result Date:  09/16/2015 CLINICAL DATA:  Hypoxia EXAM: PORTABLE CHEST 1 VIEW COMPARISON:  Study obtained earlier in the day and September 15, 2015 FINDINGS: Endotracheal tube tip is 9.7 cm above the carina. Central catheter tip is in the superior cava. Nasogastric tube tip and side port below the diaphragm. No pneumothorax. There is persistent hazy opacity in the right base. There is mild patchy opacity in the left base, slightly less than 1 day prior duct stable compared to earlier in the day. Heart size and pulmonary vascularity are normal. No adenopathy. IMPRESSION: Tube and catheter positions as described without pneumothorax. It may be prudent to consider advancing endotracheal tube approximately 4 cm given its rather high position at the level of T1. Patchy infiltrate in both lung bases, more on the right than on the left, stable on the right and slightly decreased on the left compared to 1 day prior. No new opacity. No change in cardiac silhouette. These results will be called to the ordering clinician or representative by the Radiologist Assistant, and communication documented in the PACS or zVision Dashboard. Electronically Signed   By: Bretta Bang III M.D.   On: 09/16/2015 08:35   Dg Chest Port 1 View  Result Date: 09/16/2015 CLINICAL DATA:  Respiratory failure EXAM: PORTABLE CHEST 1 VIEW COMPARISON:  Yesterday FINDINGS: Tracheal extubation. A nasogastric tube is in good position. Left subclavian central line with tip at the distal SVC. History of pneumonia with stable bilateral lung opacities. No edema, effusion, or visible pneumothorax. Normal heart size. IMPRESSION: Stable inflation after extubation. History of pneumonia with stable bilateral opacity. Electronically Signed   By: Marnee Spring M.D.   On: 09/16/2015 07:06   Dg Chest Port 1 View  Result Date: 09/15/2015 CLINICAL DATA:  Acute respiratory failure, intubated patient, aspiration pneumonia, acute renal failure EXAM: PORTABLE CHEST 1 VIEW COMPARISON:   Portable chest x-ray of September 14, 2015 FINDINGS: The lungs are well-expanded. There is persistent interstitial and linear alveolar opacity in the mid and lower right lung. There is increased interstitial density in the left infrahilar region likely in the lower lobe as well. There is no significant pleural effusion. There is no pneumothorax. The heart and pulmonary vascularity are normal. The endotracheal tube tip lies 3.6 cm above the carina. The esophagogastric tube tip projects below the inferior margin of the image. The left subclavian catheter tip projects over the midportion of the SVC. IMPRESSION: Slight interval worsening of bilateral lower lobe infiltrates consistent with pneumonia. The support tubes are in stable position. Electronically Signed   By: David  [place] M.D.   On: 09/15/2015 07:43   Dg Chest Port 1 View  Result Date: 09/14/2015 CLINICAL DATA:  Respiratory failure, intubated patient, healthcare associated pneumonia, acute renal failure. EXAM: PORTABLE CHEST 1 VIEW COMPARISON:  Portable chest x-ray of September 12, 2015 FINDINGS: The lungs are well-expanded. There is stable alveolar opacity in the right lower lung. The left lung exhibits minimal interstitial prominence adjacent to the heart border which is stable. The heart and pulmonary vascularity are normal. The endotracheal tube tip projects 4.4 cm above the carina. The esophagogastric tube tip in proximal port lie below the inferior margin of the image. The left subclavian venous catheter tip projects over  the midportion of the SVC. IMPRESSION: Persistent basilar interstitial and alveolar infiltrates, stable. The support tubes are in reasonable position. Electronically Signed   By: David  [place] M.D.   On: 09/14/2015 07:03   Dg Chest Port 1 View  Result Date: 09/12/2015 CLINICAL DATA:  Acute respiratory failure EXAM: PORTABLE CHEST 1 VIEW COMPARISON:  09/10/2015 FINDINGS: Endotracheal tube, nasogastric catheter left subclavian central line  are again seen and stable. Patchy infiltrative changes are noted in the bases right greater than left increased from the prior exam. No effusion or pneumothorax is seen. No bony abnormality is noted. IMPRESSION: Increasing bibasilar infiltrates. Electronically Signed   By: Alcide Clever M.D.   On: 09/12/2015 12:55   Dg Chest Port 1 View  Result Date: 09/10/2015 CLINICAL DATA:  Acute respiratory failure EXAM: PORTABLE CHEST 1 VIEW COMPARISON:  September 09, 2015 FINDINGS: The NG tube terminates below today's film. Support apparatus is otherwise stable. No pneumothorax. No pulmonary nodules or masses. Focal opacity in the medial right lung base persists. Recommend follow-up to resolution. IMPRESSION: 1. Stable support apparatus. 2. Persistent focal opacity in the medial right lung base. Electronically Signed   By: Gerome Sam III M.D   On: 09/10/2015 07:37  Portable Chest Xray  Result Date: 09/09/2015 CLINICAL DATA:  Intubation. EXAM: PORTABLE CHEST 1 VIEW COMPARISON:  09/08/2015. FINDINGS: Endotracheal tube and NG tube in stable position. Left subclavian line in stable position. Right lower lobe infiltrate consistent pneumonia. Heart size normal. Small right pleural effusion cannot be excluded. No pneumothorax . IMPRESSION: 1. Lines and tubes in stable position. 2. Right lower lobe infiltrate consistent with pneumonia. Slight interim progression from prior exam . Small right pleural effusion cannot be exclude. Electronically Signed   By: Maisie Fus  Register   On: 09/09/2015 06:45  Dg Chest Port 1 View  Result Date: 09/08/2015 CLINICAL DATA:  Endotracheal placement. EXAM: PORTABLE CHEST 1 VIEW COMPARISON:  09/06/2015 FINDINGS: Endotracheal tube has its tip 5 cm above the carina. Nasogastric tube enters the stomach. Left subclavian central line has its tip in the SVC above the right atrium. Heart size is normal. The left lung is clear. There is infiltrate in the right lower lobe. IMPRESSION: Lines and tubes well  positioned. Endotracheal tube 5 cm above the carina. Right lower lobe pneumonia persists. Electronically Signed   By: Paulina Fusi M.D.   On: 09/08/2015 13:34  Dg Chest Port 1 View  Result Date: 09/06/2015 CLINICAL DATA:  Pneumothorax. EXAM: PORTABLE CHEST 1 VIEW COMPARISON:  09/05/2015 . FINDINGS: Mediastinum hilar structures normal. Mild right base subsegmental atelectasis and or infiltrate. Small right pleural effusion. Heart size normal. IMPRESSION: Mild right base subsegmental atelectasis and or infiltrate. Right base pneumonia cannot be excluded. Similar finding noted on prior exam. Chest is otherwise stable. No pneumothorax. Electronically Signed   By: Maisie Fus  Register   On: 09/06/2015 07:15  Dg Chest Port 1 View  Result Date: 09/05/2015 CLINICAL DATA:  Chart states that there have been several attempts to place a "left" side IJ PICC line and there was concern for a possible pneumothorax. Pt has a medium sized bandage on right side of neck, suggesting that IJ PICC was attempted on right side. Ordering MD looked at image outside of patient's room directly after I took it. Hx of diabetes. Pt was not able to verbally respond. EXAM: PORTABLE CHEST 1 VIEW COMPARISON:  09/05/2015 at 12:52 a.m. FINDINGS: There is now no evidence of a left pneumothorax. The previously seen linear opacity  near the left apex was likely an artifact. Mild opacity at the right lung base is consistent with atelectasis. Consider pneumonia if there are consistent clinical symptoms. Lungs otherwise clear. No pleural effusion. Heart, mediastinum and hila are unremarkable. IMPRESSION: 1. No evidence of a pneumothorax on the current study. 2. Right lung base opacity most likely atelectasis. Pneumonia is possible. Electronically Signed   By: Amie Portlandavid  Ormond M.D.   On: 09/05/2015 17:38  Portable Chest 1 View  Result Date: 09/05/2015 CLINICAL DATA:  Pneumonia EXAM: PORTABLE CHEST 1 VIEW COMPARISON:  September 04, 2015 FINDINGS: There is patchy  airspace consolidation in the right base. There is new mild atelectatic change in the left base, possibly represent a second focus of early pneumonia. Lungs elsewhere clear. Heart is upper normal in size with pulmonary vascularity within normal limits. No adenopathy. No bone lesions. IMPRESSION: Patchy airspace opacity in the right apex consistent with a degree of pneumonia. Atelectasis is in the left lower lobe, new ; question a second focus of pneumonia developing in the left base. Lungs elsewhere clear. Stable cardiac silhouette. Electronically Signed   By: Bretta BangWilliam  Woodruff III M.D.   On: 09/05/2015 07:15  Dg Chest Port 1 View  Result Date: 09/04/2015 CLINICAL DATA:  Fever, probable sepsis, diabetic. Pt unable to give hx at this time EXAM: PORTABLE CHEST 1 VIEW COMPARISON:  07/10/2015 FINDINGS: Midline trachea. Normal heart size. Numerous leads and wires project over the chest. No pleural effusion or pneumothorax. Mild medial right lung base airspace disease is suspected. Clear left lung. IMPRESSION: Subtle medial right lung airspace disease. Although this could represent atelectasis, given the clinical history, suspicious for early infection. If possible, PA and lateral radiographs should be considered. Electronically Signed   By: Jeronimo GreavesKyle  Talbot M.D.   On: 09/04/2015 11:30  Dg Abd Portable 1v  Result Date: 09/09/2015 CLINICAL DATA:  OG tube placement. EXAM: PORTABLE ABDOMEN - 1 VIEW COMPARISON:  None. FINDINGS: OG tube is in place with the tip in the distal stomach. Nephrostomy on the left tube is noted. IMPRESSION: As above. Electronically Signed   By: Drusilla Kannerhomas  Dalessio M.D.   On: 09/09/2015 13:41  Ir Nephrostomy Placement Left  Result Date: 09/06/2015 INDICATION: Left hydronephrosis, urosepsis and left ureteral calculus. EXAM: IR NEPHROSTOMY PLACEMENT LEFT COMPARISON:  CT of the abdomen and pelvis on 09/05/2015 MEDICATIONS: 2 g IV Ancef. As antibiotic prophylaxis, Ancef was ordered pre-procedure and  administered intravenously within one hour of incision. ANESTHESIA/SEDATION: Fentanyl 75 mcg IV; Versed 2.0 mg IV Moderate Sedation Time:  13 minutes. The patient was continuously monitored during the procedure by the interventional radiology nurse under my direct supervision. CONTRAST:  10 mL Isovue-300 - administered into the collecting system(s) FLUOROSCOPY TIME:  Fluoroscopy Time: 2 minutes and 54 seconds. COMPLICATIONS: None immediate. PROCEDURE: Informed written consent was obtained from the patient's sister after a thorough discussion of the procedural risks, benefits and alternatives. All questions were addressed. Maximal Sterile Barrier Technique was utilized including caps, mask, sterile gowns, sterile gloves, sterile drape, hand hygiene and skin antiseptic. A timeout was performed prior to the initiation of the procedure. Ultrasound was used to localize the left kidney. Under direct ultrasound guidance, a 21 gauge needle was advanced into the lower pole collecting system. Contrast was injected after aspiration of urine. A guidewire was advanced. A transitional dilator was placed. The tract was dilated over a guidewire. A 10 French nephrostomy tube was advanced. Tube position was confirmed by fluoroscopy. The catheter was connected to  a gravity drainage bag. It was secured at the skin with a Prolene retention suture and StatLock device. FINDINGS: Ultrasound shows mild left-sided hydronephrosis. After nephrostomy tube placement, there was some bloody in urine return related to bleeding in the collecting system with tube advancement. Due to small size of the renal pelvis, the nephrostomy tube could not be completely formed and was left partially extending into the proximal ureter. IMPRESSION: Left percutaneous nephrostomy tube placement with a 10 French catheter placed and advanced into the proximal ureter. This will be left to gravity bag drainage. Electronically Signed   By: Irish Lack M.D.   On:  09/06/2015 17:04     Subjective: Seen and examined. Overnight issues. Pushing poorly communicative and at baseline.  Discharge Exam: Vitals:   09/22/15 2126 09/23/15 0446  BP: 103/75 98/77  Pulse: (!) 120 (!) 122  Resp: 20 (!) 22  Temp: 98.9 F (37.2 C) 99.3 F (37.4 C)   Vitals:   09/22/15 1509 09/22/15 2126 09/23/15 0446 09/23/15 0500  BP: 96/65 103/75 98/77   Pulse: (!) 124 (!) 120 (!) 122   Resp: 20 20 (!) 22   Temp: 97.5 F (36.4 C) 98.9 F (37.2 C) 99.3 F (37.4 C)   TempSrc: Axillary Oral Oral   SpO2: 92% 97% 94%   Weight:    57.2 kg (126 lb 1.7 oz)  Height:         Gen:  poorly verbal, not in distress HEENT:  moist mucosa, supple neck Chest: clear b/l, no added sounds CVS: N S1&S2, no murmurs, GI: soft, NT, ND, BS+, left-sided nephrostomy tube draining clear urine Musculoskeletal: warm, no edema CNS: Alert and awake, poorly communicative, nonfocal   The results of significant diagnostics from this hospitalization (including imaging, microbiology, ancillary and laboratory) are listed below for reference.     Microbiology: No results found for this or any previous visit (from the past 240 hour(s)).   Labs: BNP (last 3 results) No results for input(s): BNP in the last 8760 hours. Basic Metabolic Panel:  Recent Labs Lab 09/17/15 0500 09/18/15 0535 09/19/15 0507 09/21/15 0834  NA 152* 157* 148* 148*  K 3.1* 3.9 3.5 3.5  CL 119* 123* 117* 118*  CO2 26 27 28 23   GLUCOSE 77 225* 94 167*  BUN 42* 40* 35* 32*  CREATININE 1.56* 1.50* 1.15 1.06  CALCIUM 8.3* 8.5* 8.1* 8.5*  MG  --  2.3 2.2  --   PHOS  --  2.3* 3.3  --    Liver Function Tests:  Recent Labs Lab 09/17/15 0500 09/18/15 0535 09/19/15 0507  ALBUMIN 1.7* 1.8* 1.7*   No results for input(s): LIPASE, AMYLASE in the last 168 hours. No results for input(s): AMMONIA in the last 168 hours. CBC:  Recent Labs Lab 09/17/15 0500 09/18/15 0535 09/19/15 0507 09/21/15 0834  WBC 11.6*  7.9 6.9 5.8  NEUTROABS  --  5.6 4.7  --   HGB 7.7* 7.1* 6.7* 8.5*  HCT 24.6* 23.5* 21.9* 26.8*  MCV 81.5 83.0 83.9 83.8  PLT 272 243 227 210   Cardiac Enzymes: No results for input(s): CKTOTAL, CKMB, CKMBINDEX, TROPONINI in the last 168 hours. BNP: Invalid input(s): POCBNP CBG:  Recent Labs Lab 09/22/15 1211 09/22/15 1727 09/22/15 2130 09/23/15 0724 09/23/15 1135  GLUCAP 110* 245* 113* 195* 184*   D-Dimer No results for input(s): DDIMER in the last 72 hours. Hgb A1c No results for input(s): HGBA1C in the last 72 hours. Lipid Profile No results  for input(s): CHOL, HDL, LDLCALC, TRIG, CHOLHDL, LDLDIRECT in the last 72 hours. Thyroid function studies No results for input(s): TSH, T4TOTAL, T3FREE, THYROIDAB in the last 72 hours.  Invalid input(s): FREET3 Anemia work up No results for input(s): VITAMINB12, FOLATE, FERRITIN, TIBC, IRON, RETICCTPCT in the last 72 hours. Urinalysis    Component Value Date/Time   COLORURINE AMBER (A) 09/04/2015 1125   APPEARANCEUR CLOUDY (A) 09/04/2015 1125   LABSPEC 1.014 09/04/2015 1125   PHURINE 5.0 09/04/2015 1125   GLUCOSEU 150 (A) 09/04/2015 1125   HGBUR 2+ (A) 09/04/2015 1125   BILIRUBINUR NEGATIVE 09/04/2015 1125   KETONESUR NEGATIVE 09/04/2015 1125   PROTEINUR 100 (A) 09/04/2015 1125   NITRITE NEGATIVE 09/04/2015 1125   LEUKOCYTESUR 3+ (A) 09/04/2015 1125   Sepsis Labs Invalid input(s): PROCALCITONIN,  WBC,  LACTICIDVEN Microbiology No results found for this or any previous visit (from the past 240 hour(s)).   Time coordinating discharge: Over 30 minutes  SIGNED:   Eddie North, MD  [facility] 09/23/2015, 1:00 PM Pager   If 7PM-7AM, please contact night-coverage [email or web address] Password [email or web address]

## 2015-09-23 NOTE — Progress Notes (Addendum)
UPDATE: Per RN patient is not doing well and discharge will be canceled. [facility] [place] [facility].  Patient and family informed about DC.  Updated DC summary Faxed. Faxed updated clinicals. Updated therapy notes.  [facility] for transport. RN given report.

## 2015-09-23 NOTE — NC FL2 (Signed)
[place] LEVEL OF CARE SCREENING TOOL     IDENTIFICATION  Patient Name: Cory Salazar Birthdate: 30-May-1960 Sex: male Admission Date (Current Location): 09/05/2015  [facility] and [place] Number:  Engineering geologist and Address:  [facility],  [place] [address], [place]      Provider Number: [id number]  Attending Physician Name and Address:  Louellen Molder, MD  Relative Name and Phone Number:       Current Level of Care: Hospital Recommended Level of Care: [place] Prior Approval Number:    Date Approved/Denied:   PASRR Number:    Discharge Plan: SNF    Current Diagnoses: Patient Active Problem List   Diagnosis Date Noted  . Acute respiratory failure ([place])   . Aspiration pneumonia ([place])   . Malnutrition of moderate degree 09/06/2015  . Pressure ulcer 09/05/2015  . Sepsis ([place]) 09/05/2015  . Hydronephrosis   . HCAP (healthcare-associated pneumonia) 09/04/2015  . SIRS (systemic inflammatory response syndrome) ([place]) 09/04/2015  . ARF (acute renal failure) ([place]) 09/04/2015  . Seizure disorder ([place]) 09/04/2015    Orientation RESPIRATION BLADDER Height & Weight     Self  Normal Indwelling catheter Weight: 126 lb 1.7 oz (57.2 kg) Height:  6' 1"  (185.4 cm)  BEHAVIORAL SYMPTOMS/MOOD NEUROLOGICAL BOWEL NUTRITION STATUS  Other (Comment) (no behaviors)   Incontinent  (Dysphagia 1 (Puree);Nectar-thick liquid )  AMBULATORY STATUS COMMUNICATION OF NEEDS Skin   Total Care Verbally                         Personal Care Assistance Level of Assistance    Bathing Assistance: Maximum assistance Feeding assistance: Maximum assistance Dressing Assistance: Maximum assistance Total Care Assistance: Maximum assistance   Functional Limitations Info  Sight, Hearing, Speech Sight Info: Adequate Hearing Info: Adequate Speech Info: Impaired    SPECIAL CARE FACTORS FREQUENCY  Speech therapy     PT Frequency: 5 x wk        Speech Therapy Frequency: 3 x wk      Contractures Contractures Info: Not present    Additional Factors Info  Code Status Code Status Info: DNR             Current Medications (09/23/2015):  This is the current hospital active medication list Current Facility-Administered Medications  Medication Dose Route Frequency Provider Last Rate Last Dose  . acetaminophen (TYLENOL) tablet 650 mg  650 mg Oral Q6H PRN Mauri Brooklyn, MD   650 mg at 09/22/15 0547  . bethanechol (URECHOLINE) tablet 5 mg  5 mg Oral TID Erick Colace, NP   5 mg at 09/22/15 2124  . chlorhexidine gluconate (SAGE KIT) (PERIDEX) 0.12 % solution 15 mL  15 mL Mouth Rinse BID Juanito Doom, MD   15 mL at 09/22/15 2130  . insulin aspart (novoLOG) injection 0-20 Units  0-20 Units Subcutaneous TID AC & HS Erick Colace, NP   4 Units at 09/23/15 [phone]  . lamoTRIgine (LAMICTAL) tablet 100 mg  100 mg Oral BID Erick Colace, NP   100 mg at 09/22/15 2125  . levETIRAcetam (KEPPRA) tablet 500 mg  500 mg Oral BID Erick Colace, NP   500 mg at 09/22/15 2125  . meropenem (MERREM) 1 g in sodium chloride 0.9 % 100 mL IVPB  1 g Intravenous Q8H Randall K Absher, RPH   1 g at 09/23/15 0516  . phenytoin (DILANTIN) chewable tablet 300 mg  300  mg Oral QHS Erick Colace, NP   300 mg at 09/22/15 2124  . QUEtiapine (SEROQUEL) tablet 200 mg  200 mg Oral BID Erick Colace, NP   200 mg at 09/22/15 2125  . [place]   Oral PRN Javier Glazier, MD      . sertraline (ZOLOFT) tablet 50 mg  50 mg Oral Daily Velvet Bathe, MD   50 mg at 09/22/15 1130     Discharge Medications: Please see discharge summary for a list of discharge medications.  Relevant Imaging Results:  Relevant Lab Results:   Additional Information SSN [id number]  Lia Hopping, LCSW

## 2015-09-24 LAB — GLUCOSE, CAPILLARY
GLUCOSE-CAPILLARY: 152 mg/dL — AB (ref 65–99)
Glucose-Capillary: 182 mg/dL — ABNORMAL HIGH (ref 65–99)

## 2015-09-24 MED ORDER — LEVOFLOXACIN 500 MG PO TABS: 500.0000 mg | ORAL_TABLET | Freq: Every day | ORAL | 0 refills | Status: AC

## 2015-09-24 NOTE — Progress Notes (Addendum)
UPDATE: 2:30pm Facility Weekend RN reported that they have the patient recommended 02 cannister and can take him at this time. [facility] Called for transport. RN given report. Facility asked to refax dc summary. Faxed.  UPDATE: Facility does not have working O2 canister for patient, will call me back when they can provide one for patient room. [facility].  [facility], Patient will need 2L O2  Patient family informed, spoke with sister Magda Paganiniudrey about patient status and return. UPDATED FL2, DC Summary sent.

## 2015-09-24 NOTE — Progress Notes (Signed)
Called report to [facility], [place], [place], [phone].

## 2015-09-24 NOTE — NC FL2 (Signed)
[place] LEVEL OF CARE SCREENING TOOL     IDENTIFICATION  Patient Name: Cory Salazar Birthdate: [birth year] Sex: male Admission Date (Current Location): 09/05/2015  [facility] and [place] Number:  Engineering geologist and Address:  [facility],  [place] [address], [place]      Provider Number: [id number]  Attending Physician Name and Address:  Louellen Molder, MD  Relative Name and Phone Number:       Current Level of Care: Hospital Recommended Level of Care: [place] Prior Approval Number:    Date Approved/Denied:   PASRR Number:    Discharge Plan: SNF    Current Diagnoses: Patient Active Problem List   Diagnosis Date Noted  . Dysphagia 09/23/2015  . Dementia without behavioral disturbance 09/23/2015  . Acute hypoxemic respiratory failure ([place])   . Obstructive uropathy   . Acute respiratory failure ([place])   . Aspiration pneumonia ([place])   . Malnutrition of moderate degree 09/06/2015  . Pressure ulcer 09/05/2015  . Sepsis ([place]) 09/05/2015  . Hydronephrosis   . HCAP (healthcare-associated pneumonia) 09/04/2015  . SIRS (systemic inflammatory response syndrome) ([place]) 09/04/2015  . ARF (acute renal failure) ([place]) 09/04/2015  . Seizure disorder ([place]) 09/04/2015    Orientation RESPIRATION BLADDER Height & Weight     Self  O2 (2L) Indwelling catheter Weight: 130 lb 4.7 oz (59.1 kg) Height:  6' 1"  (185.4 cm)  BEHAVIORAL SYMPTOMS/MOOD NEUROLOGICAL BOWEL NUTRITION STATUS  Other (Comment) (no behaviors)   Incontinent  (Dysphagia 1 (Puree);Nectar-thick liquid )  AMBULATORY STATUS COMMUNICATION OF NEEDS Skin   Total Care Verbally                         Personal Care Assistance Level of Assistance    Bathing Assistance: Maximum assistance Feeding assistance: Maximum assistance Dressing Assistance: Maximum assistance Total Care Assistance: Maximum assistance   Functional Limitations Info  Sight, Hearing, Speech  Sight Info: Adequate Hearing Info: Adequate Speech Info: Impaired    SPECIAL CARE FACTORS FREQUENCY  Speech therapy     PT Frequency: 5 x wk       Speech Therapy Frequency: 3 x wk      Contractures Contractures Info: Not present    Additional Factors Info  Code Status Code Status Info: DNR             Current Medications (09/24/2015):  This is the current hospital active medication list Current Facility-Administered Medications  Medication Dose Route Frequency Provider Last Rate Last Dose  . 0.9 %  sodium chloride infusion   Intravenous Continuous Nishant Dhungel, MD 100 mL/hr at 09/24/15 0513    . acetaminophen (TYLENOL) tablet 650 mg  650 mg Oral Q6H PRN Mauri Brooklyn, MD   650 mg at 09/22/15 0547  . bethanechol (URECHOLINE) tablet 5 mg  5 mg Oral TID Erick Colace, NP   5 mg at 09/24/15 1004  . chlorhexidine gluconate (SAGE KIT) (PERIDEX) 0.12 % solution 15 mL  15 mL Mouth Rinse BID Juanito Doom, MD   15 mL at 09/24/15 0950  . insulin aspart (novoLOG) injection 0-20 Units  0-20 Units Subcutaneous TID AC & HS Erick Colace, NP   4 Units at 09/24/15 1003  . lamoTRIgine (LAMICTAL) tablet 100 mg  100 mg Oral BID Erick Colace, NP   100 mg at 09/24/15 1004  . levETIRAcetam (KEPPRA) tablet 500 mg  500 mg Oral BID Erick Colace, NP  500 mg at 09/24/15 1004  . meropenem (MERREM) 1 g in sodium chloride 0.9 % 100 mL IVPB  1 g Intravenous Q8H Randall K Absher, RPH   1 g at 09/24/15 0515  . phenytoin (DILANTIN) chewable tablet 300 mg  300 mg Oral QHS Erick Colace, NP   300 mg at 09/23/15 2256  . QUEtiapine (SEROQUEL) tablet 200 mg  200 mg Oral BID Erick Colace, NP   200 mg at 09/24/15 1004  . [place]   Oral PRN Javier Glazier, MD      . sertraline (ZOLOFT) tablet 50 mg  50 mg Oral Daily Velvet Bathe, MD   50 mg at 09/24/15 1004     Discharge Medications: Please see discharge summary for a list of discharge medications.  Relevant Imaging  Results:  Relevant Lab Results:   Additional Information SSN [id number]  Lia Hopping, LCSW

## 2015-09-24 NOTE — Progress Notes (Signed)
PROGRESS NOTE                                                                                                                                                                                                             Patient Demographics:    Cory Salazar, is a 55 y.o. male, DOB - [birth year], EAV:[id number]  Admit date - 09/05/2015   Admitting Physician Waldemar Dickens, MD  Outpatient Primary MD for the patient is No primary care provider on file.  LOS - 19     Subjective:   discharge held yesterday due to hypoxia on [place], hypotension and tachycardia. CXR shows worsened infiltrate. improved with fluid bolus and suctioining   Assessment  & Plan :   Acute respiratory failure with hypoxia (HCC) Associated Pseudomonas tracheobronchitis. Continue pulmonary toilet. High risk for aspiration with dysphagia.  Overnight vitals stable and maintaining sats on 2-3 L. He is stable this am and can be dsicharged to SNF. He is at significant risk for aspiration. will be discharged on Levaquin to complete 14 day course of antibiotics on 8/18.   Rest of the plan as in d/c summary. Stable for d/c to SNF   DVT Prophylaxis  :  Lovenox -  Lab Results  Component Value Date   PLT 210 09/21/2015    Antibiotics  :    Anti-infectives    Start     Dose/Rate Route Frequency Ordered Stop   09/24/15 0000  levofloxacin (LEVAQUIN) 500 MG tablet  Status:  Discontinued     500 mg Oral Daily 09/23/15 1259 09/24/15    09/24/15 0000  levofloxacin (LEVAQUIN) 500 MG tablet     500 mg Oral Daily 09/24/15 0853 09/30/15 2359   09/16/15 1400  meropenem (MERREM) 1 g in sodium chloride 0.9 % 100 mL IVPB     1 g 200 mL/hr over 30 Minutes Intravenous Every 8 hours 09/16/15 1243     09/15/15 1800  cefTAZidime (FORTAZ) 2 g in dextrose 5 % 50 mL IVPB  Status:  Discontinued     2 g 100 mL/hr over 30 Minutes Intravenous Every 8 hours 09/15/15 1206 09/16/15  1243   09/14/15 1400  cefTAZidime (FORTAZ) 2 g in dextrose 5 % 50 mL IVPB  Status:  Discontinued     2 g 100 mL/hr over 30 Minutes Intravenous Every  12 hours 09/14/15 1332 09/15/15 1206   09/12/15 1000  ampicillin-sulbactam (UNASYN) 1.5 g in sodium chloride 0.9 % 50 mL IVPB     1.5 g 100 mL/hr over 30 Minutes Intravenous Every 6 hours 09/12/15 0948 09/12/15 2205   09/11/15 1800  ampicillin-sulbactam (UNASYN) 1.5 g in sodium chloride 0.9 % 50 mL IVPB  Status:  Discontinued     1.5 g 100 mL/hr over 30 Minutes Intravenous Every 8 hours 09/11/15 1111 09/12/15 0948   09/08/15 2200  ampicillin-sulbactam (UNASYN) 1.5 g in sodium chloride 0.9 % 50 mL IVPB  Status:  Discontinued     1.5 g 100 mL/hr over 30 Minutes Intravenous 2 times daily 09/08/15 2040 09/11/15 1110   09/07/15 0930  ampicillin-sulbactam (UNASYN) 1.5 g in sodium chloride 0.9 % 50 mL IVPB  Status:  Discontinued     1.5 g 100 mL/hr over 30 Minutes Intravenous 2 times daily 09/07/15 0858 09/08/15 2040   09/06/15 1600  ceFAZolin (ANCEF) IVPB 2g/100 mL premix     2 g 200 mL/hr over 30 Minutes Intravenous To Radiology 09/06/15 1551 09/06/15 1638   09/06/15 1200  vancomycin (VANCOCIN) IVPB 1000 mg/200 mL premix  Status:  Discontinued     1,000 mg 200 mL/hr over 60 Minutes Intravenous Every 48 hours 09/05/15 1733 09/06/15 0954   09/06/15 1000  amoxicillin-clavulanate (AUGMENTIN) 875-125 MG per tablet 1 tablet  Status:  Discontinued     1 tablet Oral 2 times daily 09/06/15 0935 09/06/15 0954   09/06/15 1000  amoxicillin-clavulanate (AUGMENTIN) 500-125 MG per tablet 500 mg  Status:  Discontinued     1 tablet Oral Daily 09/06/15 0954 09/07/15 0858   09/05/15 2200  piperacillin-tazobactam (ZOSYN) IVPB 2.25 g  Status:  Discontinued     2.25 g 100 mL/hr over 30 Minutes Intravenous Every 8 hours 09/05/15 1733 09/06/15 0935        Objective:   Vitals:   09/23/15 1637 09/23/15 2202 09/24/15 0400 09/24/15 0438  BP:  97/68  103/72  Pulse:  (!) 125 (!) 124  (!) 113  Resp:  (!) 25  (!) 22  Temp:  99.4 F (37.4 C)  98.6 F (37 C)  TempSrc:  Axillary  Axillary  SpO2: 93% 95% 95% 98%  Weight:    59.1 kg (130 lb 4.7 oz)  Height:        Wt Readings from Last 3 Encounters:  09/24/15 59.1 kg (130 lb 4.7 oz)  09/05/15 66.4 kg (146 lb 6.2 oz)  07/10/15 88.8 kg (195 lb 12.8 oz)     Intake/Output Summary (Last 24 hours) at 09/24/15 0859 Last data filed at 09/24/15 0439  Gross per 24 hour  Intake              965 ml  Output              700 ml  Net              265 ml     Physical Exam  Gen: not in distress HEENT: moist mucosa, supple neck Chest: coarse breath sounds b/l CVS: N S1&S2, no murmurs, rubs or gallop GI: soft, NT, ND,left nephrostomy tube Musculoskeletal: warm, no edema CNS: Poorly communicative, better oriented today    Data Review:    CBC  Recent Labs Lab 09/18/15 0535 09/19/15 0507 09/21/15 0834  WBC 7.9 6.9 5.8  HGB 7.1* 6.7* 8.5*  HCT 23.5* 21.9* 26.8*  PLT 243 227 210  MCV 83.0 83.9  83.8  MCH 25.1* 25.7* 26.6  MCHC 30.2 30.6 31.7  RDW 17.4* 17.7* 16.5*  LYMPHSABS 1.5 1.5  --   MONOABS 0.6 0.5  --   EOSABS 0.2 0.2  --   BASOSABS 0.0 0.0  --     Chemistries   Recent Labs Lab 09/18/15 0535 09/19/15 0507 09/21/15 0834  NA 157* 148* 148*  K 3.9 3.5 3.5  CL 123* 117* 118*  CO2 _0 GLUCOSE 225* 94 167*  BUN 40* 35* 32*  CREATININE 1.50* 1.15 1.06  CALCIUM 8.5* 8.1* 8.5*  MG 2.3 2.2  --    ------------------------------------------------------------------------------------------------------------------ No results for input(s): CHOL, HDL, LDLCALC, TRIG, CHOLHDL, LDLDIRECT in the last 72 hours.  No results found for: HGBA1C ------------------------------------------------------------------------------------------------------------------ No results for input(s): TSH, T4TOTAL, T3FREE, THYROIDAB in the last 72 hours.  Invalid input(s):  FREET3 ------------------------------------------------------------------------------------------------------------------ No results for input(s): VITAMINB12, FOLATE, FERRITIN, TIBC, IRON, RETICCTPCT in the last 72 hours.  Coagulation profile No results for input(s): INR, PROTIME in the last 168 hours.  No results for input(s): DDIMER in the last 72 hours.  Cardiac Enzymes No results for input(s): CKMB, TROPONINI, MYOGLOBIN in the last 168 hours.  Invalid input(s): CK ------------------------------------------------------------------------------------------------------------------ No results found for: BNP  Inpatient Medications  Scheduled Meds: . bethanechol  5 mg Oral TID  . chlorhexidine gluconate (SAGE KIT)  15 mL Mouth Rinse BID  . insulin aspart  0-20 Units Subcutaneous TID AC & HS  . lamoTRIgine  100 mg Oral BID  . levETIRAcetam  500 mg Oral BID  . meropenem (MERREM) IV  1 g Intravenous Q8H  . phenytoin  300 mg Oral QHS  . QUEtiapine  200 mg Oral BID  . sertraline  50 mg Oral Daily   Continuous Infusions: . sodium chloride 100 mL/hr at 09/24/15 0513   PRN Meds:.acetaminophen, RESOURCE THICKENUP CLEAR  Micro Results No results found for this or any previous visit (from the past 240 hour(s)).  Radiology Reports Ct Abdomen Pelvis Wo Contrast  Result Date: 09/05/2015 CLINICAL DATA:  Left-sided hydronephrosis EXAM: CT ABDOMEN AND PELVIS WITHOUT CONTRAST TECHNIQUE: Multidetector CT imaging of the abdomen and pelvis was performed following the standard protocol without IV contrast. COMPARISON:  Renal ultrasound - 09/04/2015 FINDINGS: The lack of intravenous contrast limits the ability to evaluate solid abdominal organs. Examination is further degraded secondary to patient respiratory artifact. Lower chest: Limited visualization of the lower thorax demonstrates dependent subpleural ground-glass atelectasis. Airspace opacities are seen within the right lower lobe with associated  air bronchograms. Additionally, there are ill-defined nodules seen within the right lower lobe which appear to demonstrate a tree-in-bud pattern. No pleural effusion. Normal heart size. No pericardial effusion. Decreased attenuation of the intra cardiac blood pool suggestive of anemia. Hepatobiliary: Normal hepatic contour. The gallbladder appears filled with radiopaque material suggestive of sludge. No definite ascites. Pancreas: Normal noncontrast appearance of the pancreas Spleen: Normal noncontrast appearance of the spleen. Incidental common of a small splenule. Adrenals/Urinary Tract: Note is made of a punctate (approximately 0.5 x 0.6 cm) stone within the superior aspects of the left ureter (axial image 55, series 2; coronal image 64, series 602) which results in mild upstream ureterectasis and asymmetric left-sided pelvicaliectasis. No additional evidence of left-sided nephrolithiasis. Solitary punctate (approximately 2 mm) nonobstructing stone within the superior pole of the right kidney (image 40, series 2). No evidence of right-sided urinary obstruction. No renal stones are seen along expected course of the right ureter. The urinary bladder is  decompressed with a Foley catheter. Several prominent phleboliths are seen within the left hemipelvis. Stomach/Bowel: A rectal catheter is in place. There is diffuse thickening of the rectal wall, potentially reactive. Moderate colonic stool burden without evidence of enteric obstruction. Normal noncontrast appearance of the terminal ileum. The appendix is not visualized, however there is no asymmetric right-sided very cecal inflammatory change. No pneumoperitoneum, pneumatosis or portal venous gas. Vascular/Lymphatic: Minimal amount of atherosclerotic plaque within a normal caliber abdominal aorta. A central venous catheter tip terminates within the peripheral aspects of the left common iliac vein. No definitive bulky retroperitoneal, mesenteric, pelvic or inguinal  lymphadenopathy on this noncontrast examination. Reproductive: No evidence approximately on this noncontrast examination. No free fluid in the pelvic cul-de-sac. Other: Mild diffuse body wall anasarca. Musculoskeletal: No acute or aggressive osseous abnormalities. Stigmata of DISH within the thoracic spine. IMPRESSION: 1. Punctate (approximately 0.6 cm) stone within the superior aspect of the left ureter results in very mild upstream left-sided ureterectasis and pelvicaliectasis. 2. Additional solitary punctate (approximately 2 mm) nonobstructing right-sided renal stone. No evidence of right-sided urinary obstruction. 3. Bibasilar atelectasis with potential developing airspace opacity within the right lower lobe. Clinical correlation is advised. 4. Diffuse thickening of the rectal wall, potentially reactive secondary to placement of a rectal catheter though an enteritis could have a similar appearance. Clinical correlation is advised. No evidence of enteric obstruction. Electronically Signed   By: Sandi Mariscal M.D.   On: 09/05/2015 21:21  Dg Chest 1 View  Result Date: 09/05/2015 CLINICAL DATA:  Unsuccessful central line placement. EXAM: CHEST 1 VIEW COMPARISON:  Radiograph of same day. FINDINGS: The heart size and mediastinal contours are within normal limits. No pleural effusion is noted. Right lung is clear. Possible pleural line is seen in left lung apex which may represent small apical pneumothorax. The visualized skeletal structures are unremarkable. IMPRESSION: Possible small left apical pneumothorax. Follow-up radiographs are recommended. Critical Value/emergent results were called by telephone at the time of interpretation on 09/05/2015 at 1:34 pm to Lindwood Qua, nurse practitioner, who verbally acknowledged these results and will immediately contact Dr. Ashby Dawes. Electronically Signed   By: Marijo Conception, M.D.   On: 09/05/2015 13:36  Dg Abd 1 View  Result Date: 09/16/2015 CLINICAL DATA:   55 year old male with respiratory distress. Re intubated. Initial encounter. EXAM: ABDOMEN - 1 VIEW COMPARISON:  09/09/2015. Portable chest from today reported separately. FINDINGS: Portable AP supine view at 1404 hours. Enteric tube side hole at the distal thoracic esophagus level. Advance 8 cm to place this within the stomach. Stable left percutaneous nephrostomy tube. Gas-filled but nondilated bowel loops in the visible abdomen. IMPRESSION: 1. Enteric tube side hole of the distal thoracic esophagus, advance 8 cm to place the side hole within the stomach. 2. Increased abdominal bowel gas, might reflect ileus. 3. Left side percutaneous nephrostomy appear stable. Electronically Signed   By: Genevie Ann M.D.   On: 09/16/2015 14:27   US Renal  Result Date: 09/04/2015 CLINICAL DATA:  Acute renal failure EXAM: RENAL / URINARY TRACT ULTRASOUND COMPLETE COMPARISON:  None. FINDINGS: Right Kidney: Length: 12.7 cm.  Slight increased cortical echogenicity Left Kidney: Length: 12.9 cm.  Mild hydronephrosis Bladder: The bladder is decompressed with a Foley catheter IMPRESSION: 1. Mild hydronephrosis on the left, age indeterminate. If there is concern for acute obstruction, CT scan may better evaluate. Electronically Signed   By: Dorise Bullion III M.D   On: 09/04/2015 13:50  Dg Chest Port 1 View  Result  Date: 09/23/2015 CLINICAL DATA:  Hypoxia EXAM: PORTABLE CHEST 1 VIEW COMPARISON:  09/19/2015 FINDINGS: Cardiac shadow is stable. Bibasilar infiltrates are seen. These are slightly more marked than that seen on the prior exam. Increased interstitial changes are noted as well. No bony abnormality is seen. IMPRESSION: Increasing bibasilar infiltrates. Electronically Signed   By: Inez Catalina M.D.   On: 09/23/2015 14:56   Dg Chest Port 1 View  Result Date: 09/19/2015 CLINICAL DATA:  Intubation. EXAM: PORTABLE CHEST 1 VIEW COMPARISON:  09/17/2015. FINDINGS: Interim extubation. Interim removal of NG tube. Left subclavian line  stable position. Heart size stable. Bibasilar infiltrates and/or edema again noted without interim change . No pleural effusion or pneumothorax. IMPRESSION: 1. Interim removal of endotracheal tube and NG tube. Left subclavian line in stable position. 2. Persistent bibasilar infiltrates and or edema noted. No interim change. Electronically Signed   By: Marcello Moores  Register   On: 09/19/2015 07:03   Dg Chest Port 1 View  Result Date: 09/17/2015 CLINICAL DATA:  Acute hypoxemic respiratory failure EXAM: PORTABLE CHEST 1 VIEW COMPARISON:  09/16/2015 FINDINGS: Endotracheal tube, NG tube, and LEFT central venous line are unchanged. Normal cardiac silhouette. Mild central venous congestion is improved. No pneumothorax. IMPRESSION: 1. Improvement in central venous congestion. 2. Stable support apparatus. Electronically Signed   By: Suzy Bouchard M.D.   On: 09/17/2015 07:21   Dg Chest Port 1 View  Result Date: 09/16/2015 CLINICAL DATA:  55 year old male with respiratory distress. Re intubated. Initial encounter. EXAM: PORTABLE CHEST 1 VIEW COMPARISON:  0819 hours today and earlier. FINDINGS: Portable AP supine view at 1359 hours. Endotracheal tube appears appropriately placed, tip just below the clavicles. Enteric tube in place, but side hole the level of the distal thoracic esophagus. Recommend advancing 8 cm to place the side hole within the stomach. Stable left subclavian central line. Mildly lower lung volumes. Continued perihilar and basilar reticulonodular opacity in both lungs. No pneumothorax or pleural effusion identified. Mediastinal contours remain within normal limits. Mild gaseous distension of bowel in the upper abdomen. IMPRESSION: 1. Endotracheal tube in good position. Enteric tube side hole at the distal thoracic esophagus, advanced 8 cm to place the side hole within the stomach. 2. Stable ventilation, bilateral reticulonodular opacity with top differential considerations of interstitial edema versus  viral/atypical infection. Electronically Signed   By: Genevie Ann M.D.   On: 09/16/2015 14:26   Dg Chest Port 1 View  Result Date: 09/16/2015 CLINICAL DATA:  Hypoxia EXAM: PORTABLE CHEST 1 VIEW COMPARISON:  Study obtained earlier in the day and September 15, 2015 FINDINGS: Endotracheal tube tip is 9.7 cm above the carina. Central catheter tip is in the superior cava. Nasogastric tube tip and side port below the diaphragm. No pneumothorax. There is persistent hazy opacity in the right base. There is mild patchy opacity in the left base, slightly less than 1 day prior duct stable compared to earlier in the day. Heart size and pulmonary vascularity are normal. No adenopathy. IMPRESSION: Tube and catheter positions as described without pneumothorax. It may be prudent to consider advancing endotracheal tube approximately 4 cm given its rather high position at the level of T1. Patchy infiltrate in both lung bases, more on the right than on the left, stable on the right and slightly decreased on the left compared to 1 day prior. No new opacity. No change in cardiac silhouette. These results will be called to the ordering clinician or representative by the Radiologist Assistant, and communication documented in the  PACS or zVision Dashboard. Electronically Signed   By: Lowella Grip III M.D.   On: 09/16/2015 08:35   Dg Chest Port 1 View  Result Date: 09/16/2015 CLINICAL DATA:  Respiratory failure EXAM: PORTABLE CHEST 1 VIEW COMPARISON:  Yesterday FINDINGS: Tracheal extubation. A nasogastric tube is in good position. Left subclavian central line with tip at the distal SVC. History of pneumonia with stable bilateral lung opacities. No edema, effusion, or visible pneumothorax. Normal heart size. IMPRESSION: Stable inflation after extubation. History of pneumonia with stable bilateral opacity. Electronically Signed   By: Monte Fantasia M.D.   On: 09/16/2015 07:06   Dg Chest Port 1 View  Result Date: 09/15/2015 CLINICAL  DATA:  Acute respiratory failure, intubated patient, aspiration pneumonia, acute renal failure EXAM: PORTABLE CHEST 1 VIEW COMPARISON:  Portable chest x-ray of September 14, 2015 FINDINGS: The lungs are well-expanded. There is persistent interstitial and linear alveolar opacity in the mid and lower right lung. There is increased interstitial density in the left infrahilar region likely in the lower lobe as well. There is no significant pleural effusion. There is no pneumothorax. The heart and pulmonary vascularity are normal. The endotracheal tube tip lies 3.6 cm above the carina. The esophagogastric tube tip projects below the inferior margin of the image. The left subclavian catheter tip projects over the midportion of the SVC. IMPRESSION: Slight interval worsening of bilateral lower lobe infiltrates consistent with pneumonia. The support tubes are in stable position. Electronically Signed   By: David  Martinique M.D.   On: 09/15/2015 07:43   Dg Chest Port 1 View  Result Date: 09/14/2015 CLINICAL DATA:  Respiratory failure, intubated patient, healthcare associated pneumonia, acute renal failure. EXAM: PORTABLE CHEST 1 VIEW COMPARISON:  Portable chest x-ray of September 12, 2015 FINDINGS: The lungs are well-expanded. There is stable alveolar opacity in the right lower lung. The left lung exhibits minimal interstitial prominence adjacent to the heart border which is stable. The heart and pulmonary vascularity are normal. The endotracheal tube tip projects 4.4 cm above the carina. The esophagogastric tube tip in proximal port lie below the inferior margin of the image. The left subclavian venous catheter tip projects over the midportion of the SVC. IMPRESSION: Persistent basilar interstitial and alveolar infiltrates, stable. The support tubes are in reasonable position. Electronically Signed   By: David  Martinique M.D.   On: 09/14/2015 07:03   Dg Chest Port 1 View  Result Date: 09/12/2015 CLINICAL DATA:  Acute respiratory  failure EXAM: PORTABLE CHEST 1 VIEW COMPARISON:  09/10/2015 FINDINGS: Endotracheal tube, nasogastric catheter left subclavian central line are again seen and stable. Patchy infiltrative changes are noted in the bases right greater than left increased from the prior exam. No effusion or pneumothorax is seen. No bony abnormality is noted. IMPRESSION: Increasing bibasilar infiltrates. Electronically Signed   By: Inez Catalina M.D.   On: 09/12/2015 12:55   Dg Chest Port 1 View  Result Date: 09/10/2015 CLINICAL DATA:  Acute respiratory failure EXAM: PORTABLE CHEST 1 VIEW COMPARISON:  September 09, 2015 FINDINGS: The NG tube terminates below today's film. Support apparatus is otherwise stable. No pneumothorax. No pulmonary nodules or masses. Focal opacity in the medial right lung base persists. Recommend follow-up to resolution. IMPRESSION: 1. Stable support apparatus. 2. Persistent focal opacity in the medial right lung base. Electronically Signed   By: Dorise Bullion III M.D   On: 09/10/2015 07:37  Portable Chest Xray  Result Date: 09/09/2015 CLINICAL DATA:  Intubation. EXAM: PORTABLE CHEST 1  VIEW COMPARISON:  09/08/2015. FINDINGS: Endotracheal tube and NG tube in stable position. Left subclavian line in stable position. Right lower lobe infiltrate consistent pneumonia. Heart size normal. Small right pleural effusion cannot be excluded. No pneumothorax . IMPRESSION: 1. Lines and tubes in stable position. 2. Right lower lobe infiltrate consistent with pneumonia. Slight interim progression from prior exam . Small right pleural effusion cannot be exclude. Electronically Signed   By: Marcello Moores  Register   On: 09/09/2015 06:45  Dg Chest Port 1 View  Result Date: 09/08/2015 CLINICAL DATA:  Endotracheal placement. EXAM: PORTABLE CHEST 1 VIEW COMPARISON:  09/06/2015 FINDINGS: Endotracheal tube has its tip 5 cm above the carina. Nasogastric tube enters the stomach. Left subclavian central line has its tip in the SVC above  the right atrium. Heart size is normal. The left lung is clear. There is infiltrate in the right lower lobe. IMPRESSION: Lines and tubes well positioned. Endotracheal tube 5 cm above the carina. Right lower lobe pneumonia persists. Electronically Signed   By: Nelson Chimes M.D.   On: 09/08/2015 13:34  Dg Chest Port 1 View  Result Date: 09/06/2015 CLINICAL DATA:  Pneumothorax. EXAM: PORTABLE CHEST 1 VIEW COMPARISON:  09/05/2015 . FINDINGS: Mediastinum hilar structures normal. Mild right base subsegmental atelectasis and or infiltrate. Small right pleural effusion. Heart size normal. IMPRESSION: Mild right base subsegmental atelectasis and or infiltrate. Right base pneumonia cannot be excluded. Similar finding noted on prior exam. Chest is otherwise stable. No pneumothorax. Electronically Signed   By: Marcello Moores  Register   On: 09/06/2015 07:15  Dg Chest Port 1 View  Result Date: 09/05/2015 CLINICAL DATA:  Chart states that there have been several attempts to place a "left" side IJ PICC line and there was concern for a possible pneumothorax. Pt has a medium sized bandage on right side of neck, suggesting that IJ PICC was attempted on right side. Ordering MD looked at image outside of patient's room directly after I took it. Hx of diabetes. Pt was not able to verbally respond. EXAM: PORTABLE CHEST 1 VIEW COMPARISON:  09/05/2015 at 12:52 a.m. FINDINGS: There is now no evidence of a left pneumothorax. The previously seen linear opacity near the left apex was likely an artifact. Mild opacity at the right lung base is consistent with atelectasis. Consider pneumonia if there are consistent clinical symptoms. Lungs otherwise clear. No pleural effusion. Heart, mediastinum and hila are unremarkable. IMPRESSION: 1. No evidence of a pneumothorax on the current study. 2. Right lung base opacity most likely atelectasis. Pneumonia is possible. Electronically Signed   By: Lajean Manes M.D.   On: 09/05/2015 17:38  Portable  Chest 1 View  Result Date: 09/05/2015 CLINICAL DATA:  Pneumonia EXAM: PORTABLE CHEST 1 VIEW COMPARISON:  September 04, 2015 FINDINGS: There is patchy airspace consolidation in the right base. There is new mild atelectatic change in the left base, possibly represent a second focus of early pneumonia. Lungs elsewhere clear. Heart is upper normal in size with pulmonary vascularity within normal limits. No adenopathy. No bone lesions. IMPRESSION: Patchy airspace opacity in the right apex consistent with a degree of pneumonia. Atelectasis is in the left lower lobe, new ; question a second focus of pneumonia developing in the left base. Lungs elsewhere clear. Stable cardiac silhouette. Electronically Signed   By: Lowella Grip III M.D.   On: 09/05/2015 07:15  Dg Chest Port 1 View  Result Date: 09/04/2015 CLINICAL DATA:  Fever, probable sepsis, diabetic. Pt unable to give hx at this  time EXAM: PORTABLE CHEST 1 VIEW COMPARISON:  07/10/2015 FINDINGS: Midline trachea. Normal heart size. Numerous leads and wires project over the chest. No pleural effusion or pneumothorax. Mild medial right lung base airspace disease is suspected. Clear left lung. IMPRESSION: Subtle medial right lung airspace disease. Although this could represent atelectasis, given the clinical history, suspicious for early infection. If possible, PA and lateral radiographs should be considered. Electronically Signed   By: Abigail Miyamoto M.D.   On: 09/04/2015 11:30  Dg Abd Portable 1v  Result Date: 09/09/2015 CLINICAL DATA:  OG tube placement. EXAM: PORTABLE ABDOMEN - 1 VIEW COMPARISON:  None. FINDINGS: OG tube is in place with the tip in the distal stomach. Nephrostomy on the left tube is noted. IMPRESSION: As above. Electronically Signed   By: Inge Rise M.D.   On: 09/09/2015 13:41  Ir Nephrostomy Placement Left  Result Date: 09/06/2015 INDICATION: Left hydronephrosis, urosepsis and left ureteral calculus. EXAM: IR NEPHROSTOMY PLACEMENT LEFT  COMPARISON:  CT of the abdomen and pelvis on 09/05/2015 MEDICATIONS: 2 g IV Ancef. As antibiotic prophylaxis, Ancef was ordered pre-procedure and administered intravenously within one hour of incision. ANESTHESIA/SEDATION: Fentanyl 75 mcg IV; Versed 2.0 mg IV Moderate Sedation Time:  13 minutes. The patient was continuously monitored during the procedure by the interventional radiology nurse under my direct supervision. CONTRAST:  10 mL Isovue-300 - administered into the collecting system(s) FLUOROSCOPY TIME:  Fluoroscopy Time: 2 minutes and 54 seconds. COMPLICATIONS: None immediate. PROCEDURE: Informed written consent was obtained from the patient's sister after a thorough discussion of the procedural risks, benefits and alternatives. All questions were addressed. Maximal Sterile Barrier Technique was utilized including caps, mask, sterile gowns, sterile gloves, sterile drape, hand hygiene and skin antiseptic. A timeout was performed prior to the initiation of the procedure. Ultrasound was used to localize the left kidney. Under direct ultrasound guidance, a 21 gauge needle was advanced into the lower pole collecting system. Contrast was injected after aspiration of urine. A guidewire was advanced. A transitional dilator was placed. The tract was dilated over a guidewire. A 10 French nephrostomy tube was advanced. Tube position was confirmed by fluoroscopy. The catheter was connected to a gravity drainage bag. It was secured at the skin with a Prolene retention suture and StatLock device. FINDINGS: Ultrasound shows mild left-sided hydronephrosis. After nephrostomy tube placement, there was some bloody in urine return related to bleeding in the collecting system with tube advancement. Due to small size of the renal pelvis, the nephrostomy tube could not be completely formed and was left partially extending into the proximal ureter. IMPRESSION: Left percutaneous nephrostomy tube placement with a 10 French catheter  placed and advanced into the proximal ureter. This will be left to gravity bag drainage. Electronically Signed   By: Aletta Edouard M.D.   On: 09/06/2015 17:04   Time Spent in minutes  25   Louellen Molder M.D on 09/24/2015 at 8:59 AM  Between 7am to 7pm - Pager - [phone]  After 7pm go to [email or web address] - password [facility]  [facility] -  Office  [phone]

## 2015-09-24 NOTE — Progress Notes (Signed)
Patient discharged to [facility].  EMS transporting patient.  Patient leaving with prescriptions and a ring with a blue stone which was cut off his finger during this admission.  2L [place].  Denies pain.  No s/s of distress.  Left nephrostomy tube intact,patent, and draining.  No complaints.

## 2015-09-28 LAB — CULTURE, BLOOD (ROUTINE X 2)
CULTURE: NO GROWTH
Culture: NO GROWTH

## 2015-09-29 ENCOUNTER — Other Ambulatory Visit (HOSPITAL_COMMUNITY): Payer: Medicare Other

## 2015-10-14 DEATH — deceased

## 2017-06-27 IMAGING — DX DG CHEST 1V
1 series · 1 of 1 positions shown · non-contrast
Comparison: Radiograph of same day.

CLINICAL DATA: Unsuccessful central line placement.

EXAM:
CHEST 1 VIEW

[chest ap]
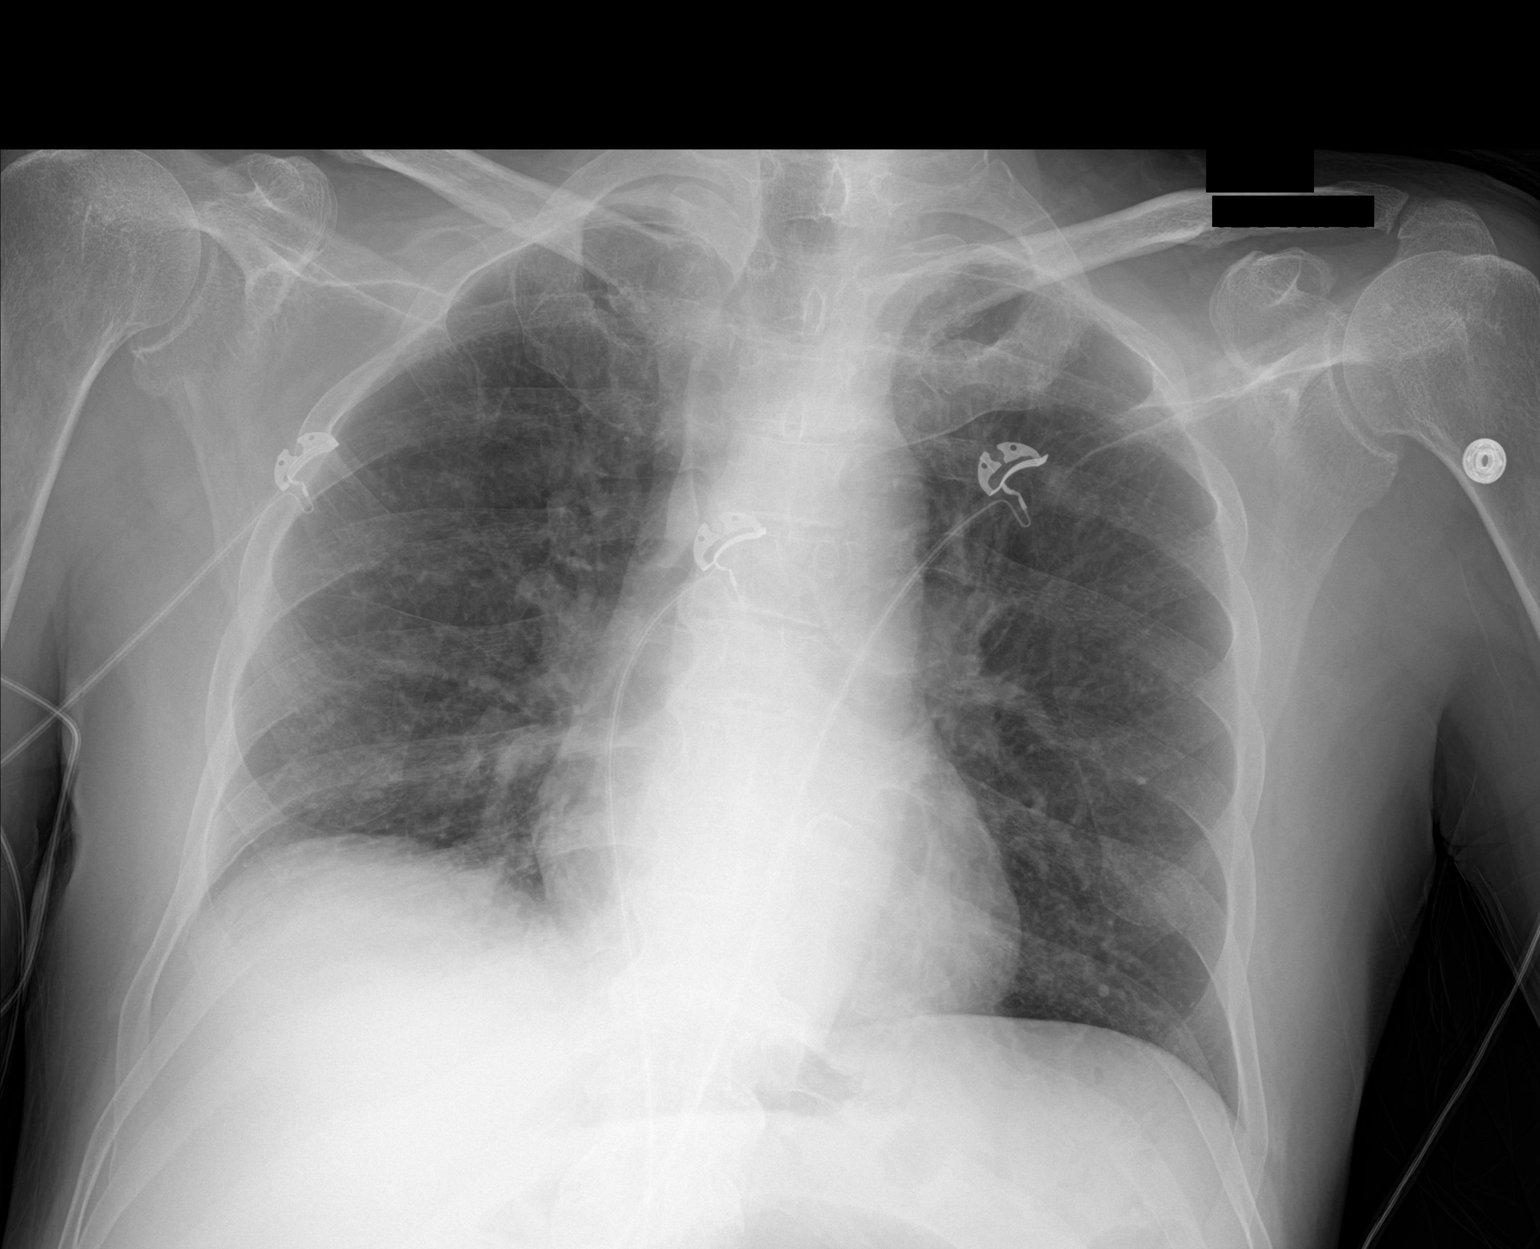

[1 of 1 positions shown; findings below may reference images not displayed]

FINDINGS: The heart size and mediastinal contours are within normal limits. No
pleural effusion is noted. Right lung is clear. Possible pleural
line is seen in left lung apex which may represent small apical
pneumothorax. The visualized skeletal structures are unremarkable.
IMPRESSION: Possible small left apical pneumothorax. Follow-up radiographs are
recommended. Critical Value/emergent results were called by
telephone at the time of interpretation on 09/05/2015 at [DATE] to
Chassidy Tojin, nurse practitioner, who verbally acknowledged these
results and will immediately contact Dr. Jumper.

## 2017-06-28 IMAGING — DX DG CHEST 1V PORT
1 series · 1 of 1 positions shown · non-contrast
Comparison: 09/05/2015 .

CLINICAL DATA: Pneumothorax.

EXAM:
PORTABLE CHEST 1 VIEW

[chest ap]
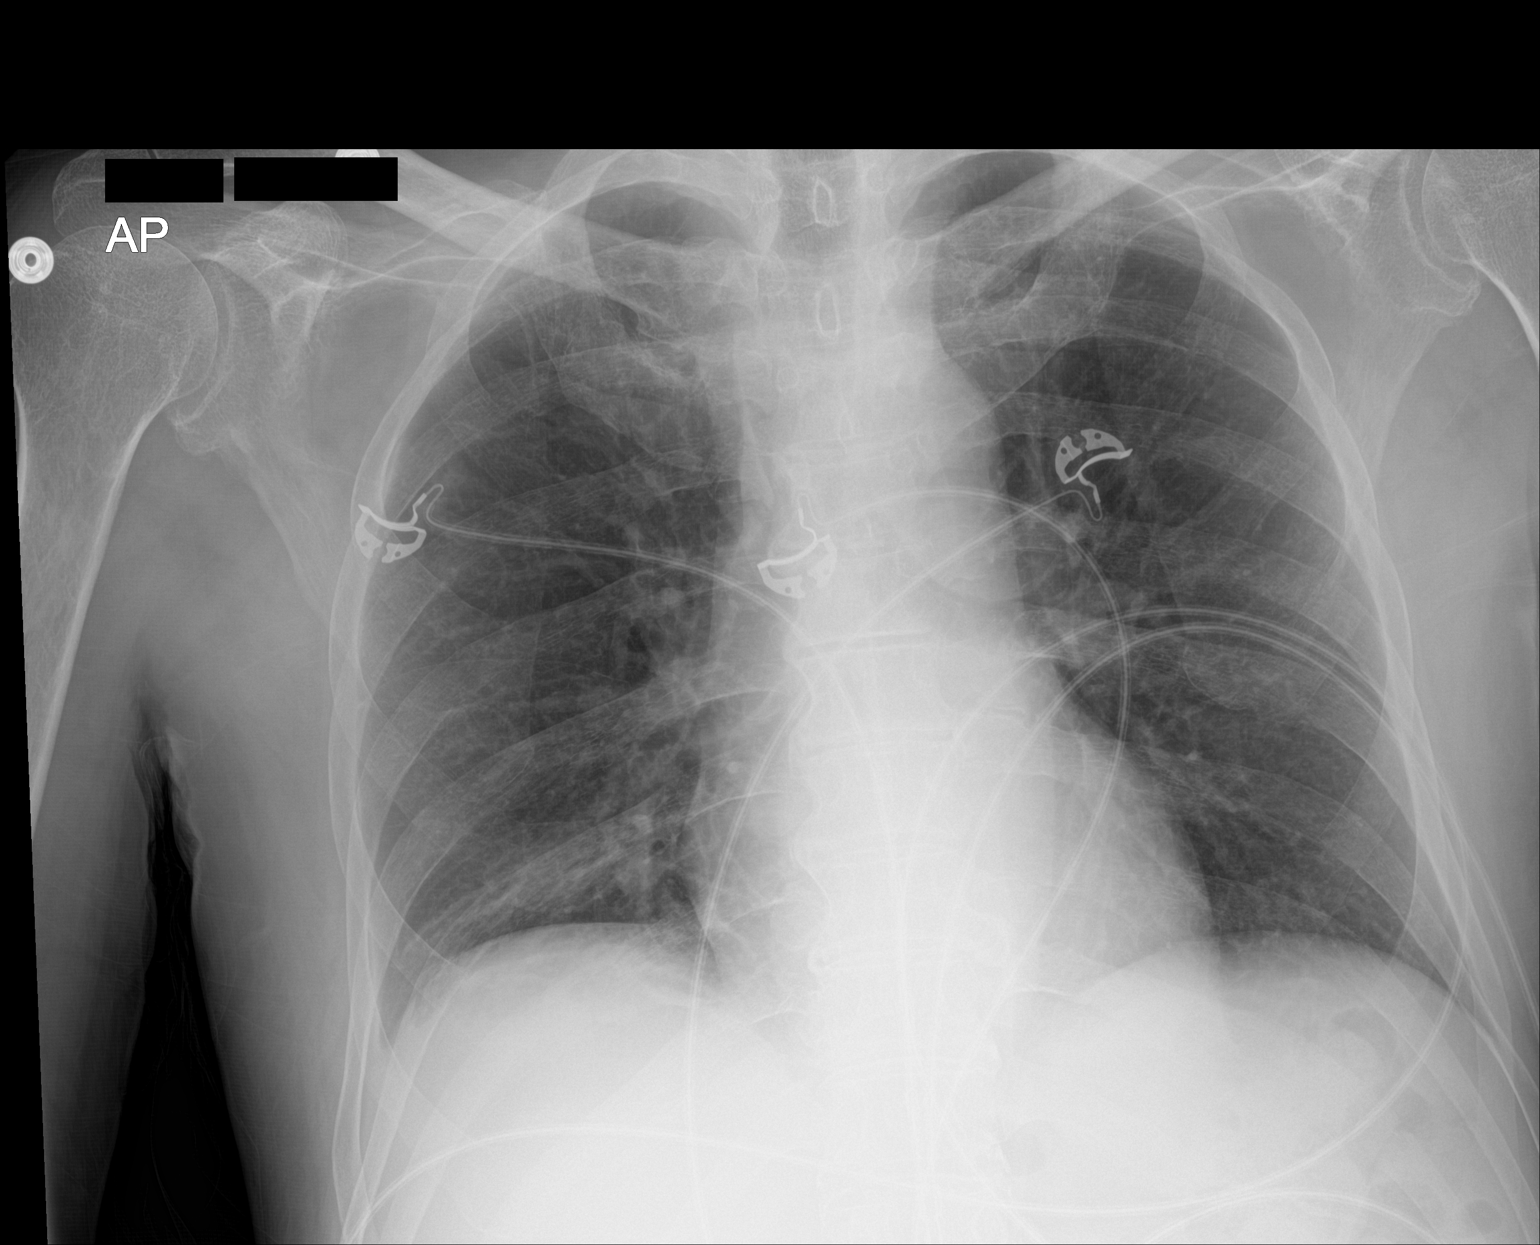

[1 of 1 positions shown; findings below may reference images not displayed]

FINDINGS: Mediastinum hilar structures normal. Mild right base subsegmental
atelectasis and or infiltrate. Small right pleural effusion. Heart
size normal.
IMPRESSION: Mild right base subsegmental atelectasis and or infiltrate. Right
base pneumonia cannot be excluded. Similar finding noted on prior
exam. Chest is otherwise stable. No pneumothorax.

## 2017-06-30 IMAGING — DX DG CHEST 1V PORT
1 series · 1 of 1 positions shown · non-contrast
Comparison: 09/06/2015

CLINICAL DATA: Endotracheal placement.

EXAM:
PORTABLE CHEST 1 VIEW

[chest ap]
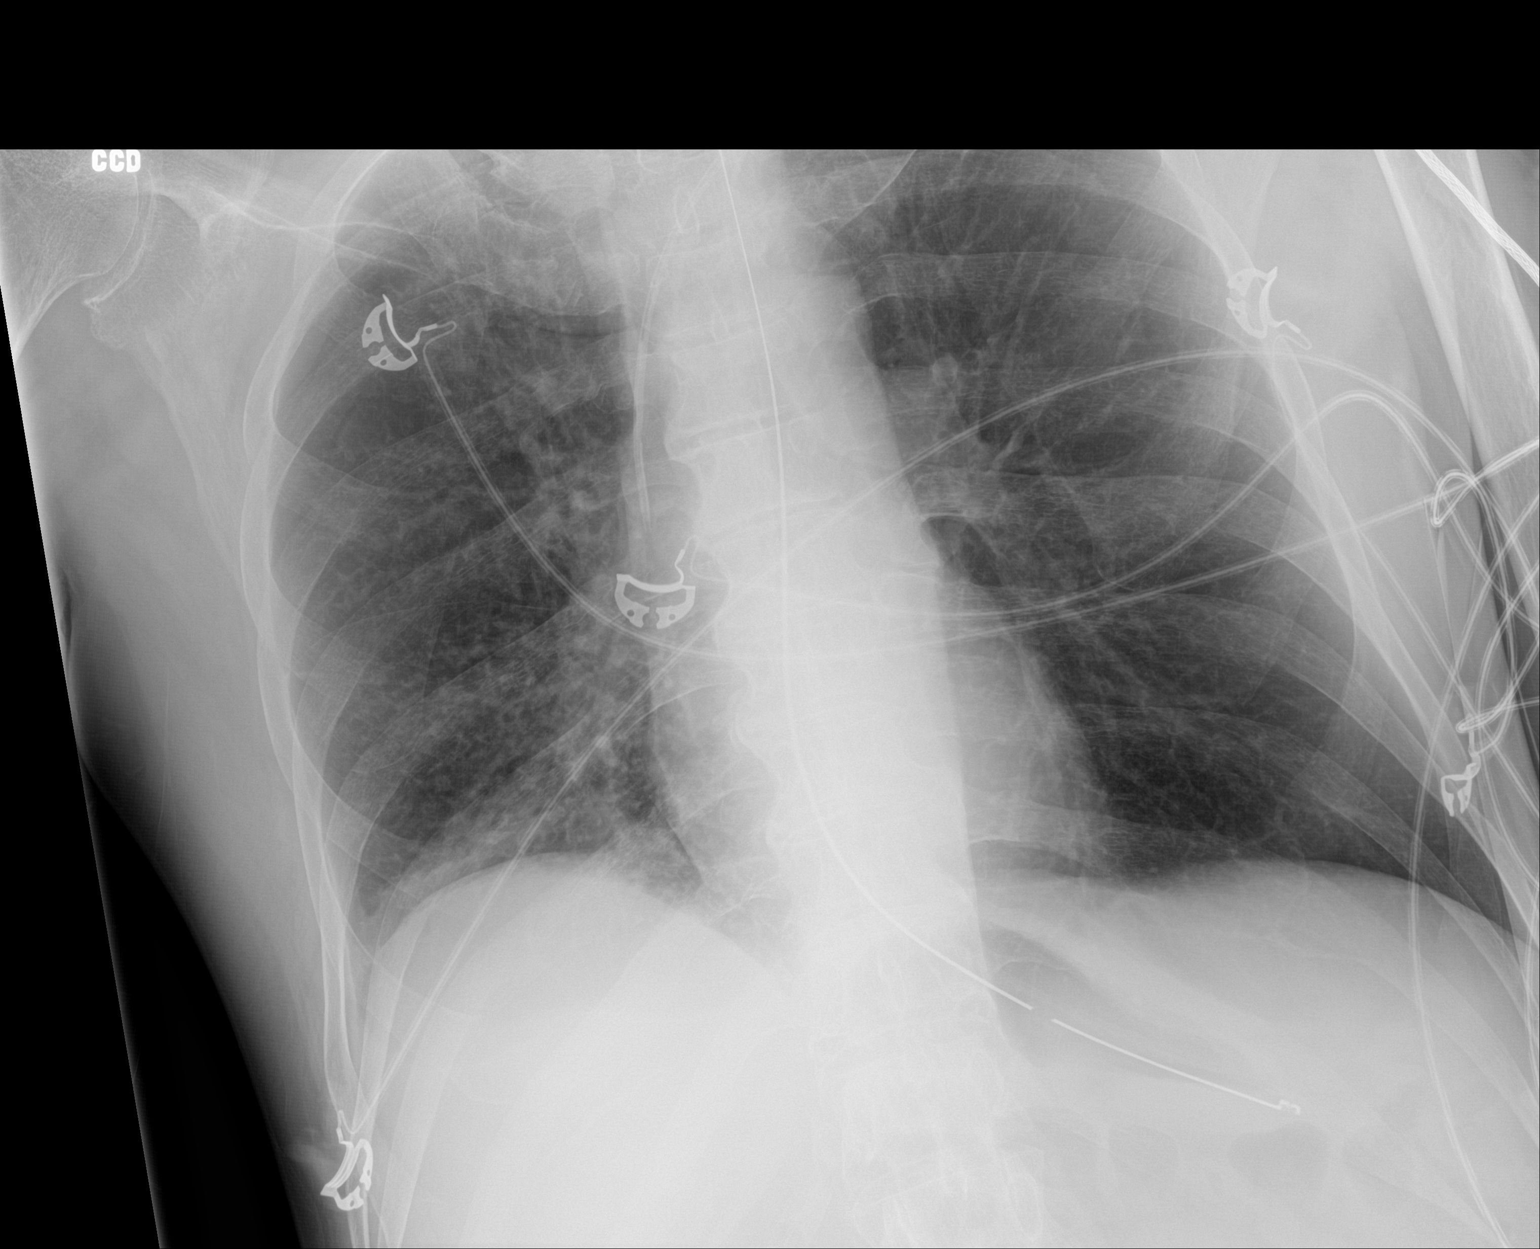

[1 of 1 positions shown; findings below may reference images not displayed]

FINDINGS: Endotracheal tube has its tip 5 cm above the carina. Nasogastric
tube enters the stomach. Left subclavian central line has its tip in
the SVC above the right atrium. Heart size is normal. The left lung
is clear. There is infiltrate in the right lower lobe.
IMPRESSION: Lines and tubes well positioned. Endotracheal tube 5 cm above the
carina.

Right lower lobe pneumonia persists.

## 2017-07-04 IMAGING — DX DG CHEST 1V PORT
1 series · 1 of 1 positions shown · non-contrast
Comparison: 09/10/2015

CLINICAL DATA: Acute respiratory failure

EXAM:
PORTABLE CHEST 1 VIEW

[chest ap]
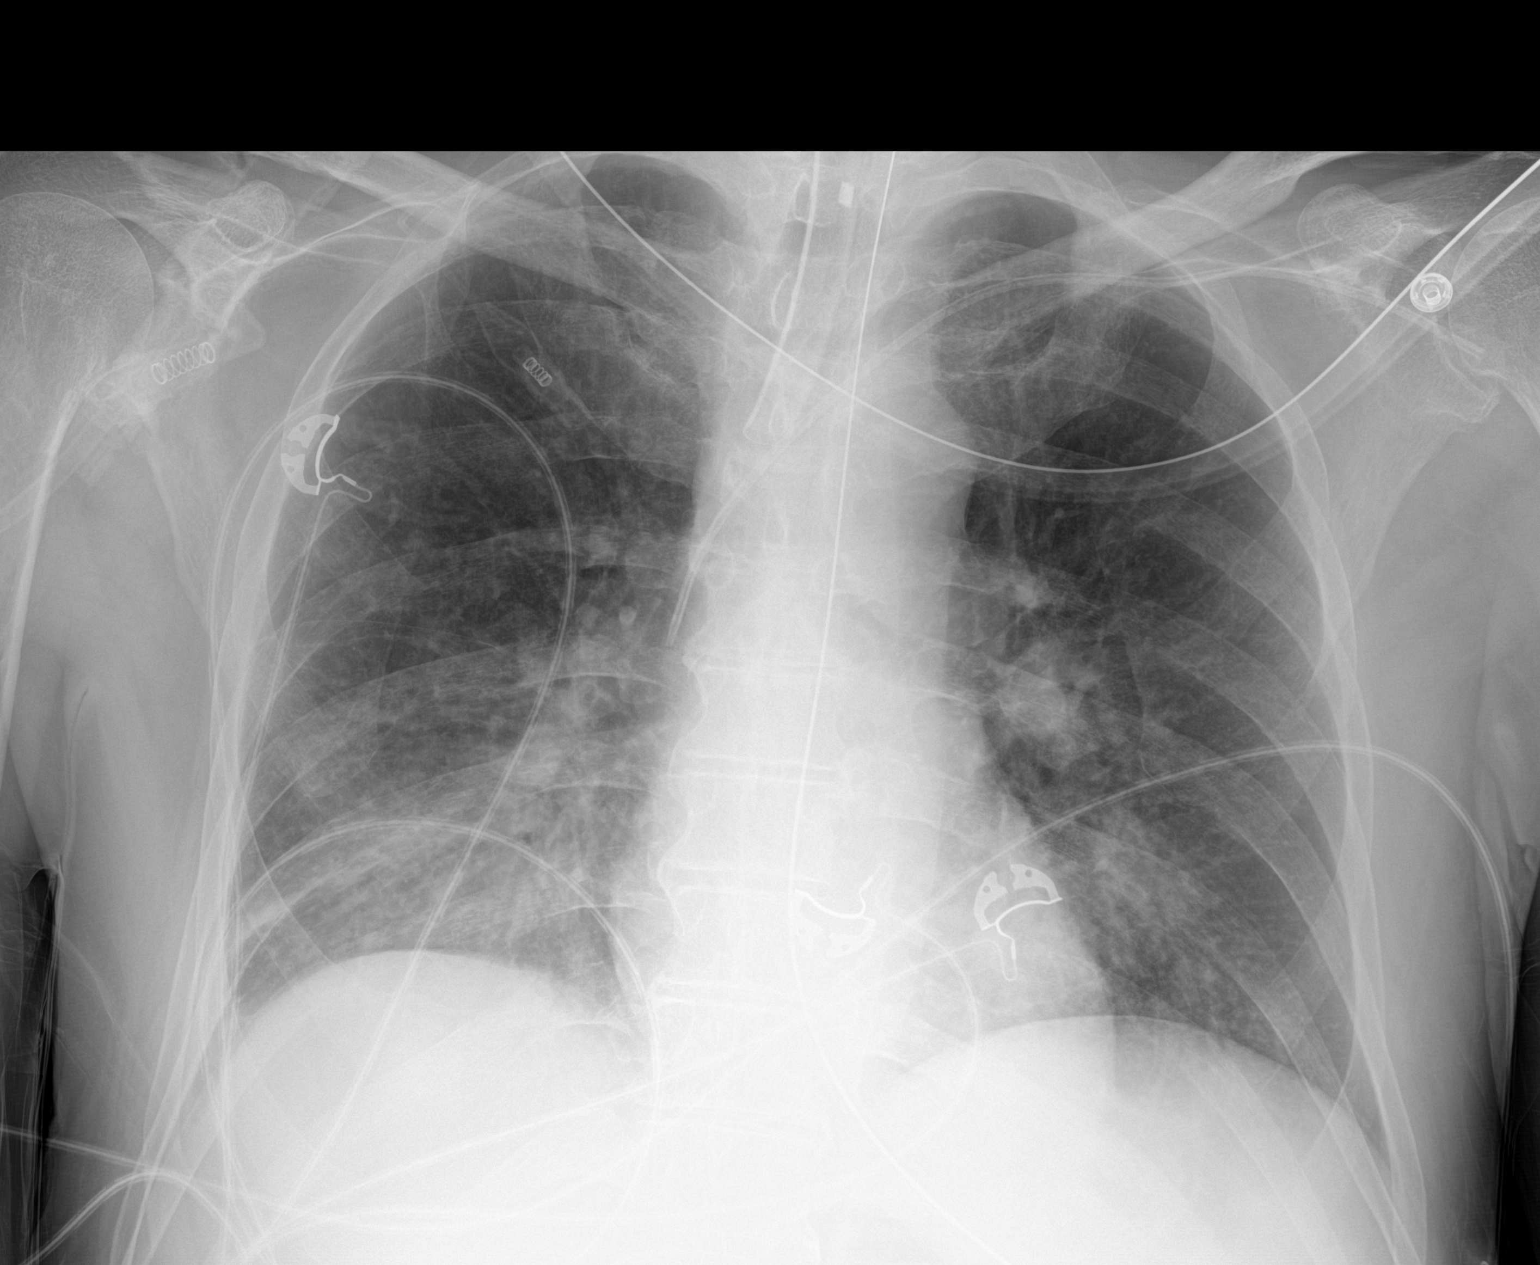

[1 of 1 positions shown; findings below may reference images not displayed]

FINDINGS: Endotracheal tube, nasogastric catheter left subclavian central line
are again seen and stable. Patchy infiltrative changes are noted in
the bases right greater than left increased from the prior exam. No
effusion or pneumothorax is seen. No bony abnormality is noted.
IMPRESSION: Increasing bibasilar infiltrates.

## 2017-07-08 IMAGING — DX DG CHEST 1V PORT
1 series · 1 of 1 positions shown · non-contrast
Comparison: Study obtained earlier in the day and September 15, 2015

CLINICAL DATA: Hypoxia

EXAM:
PORTABLE CHEST 1 VIEW

[chest ap]
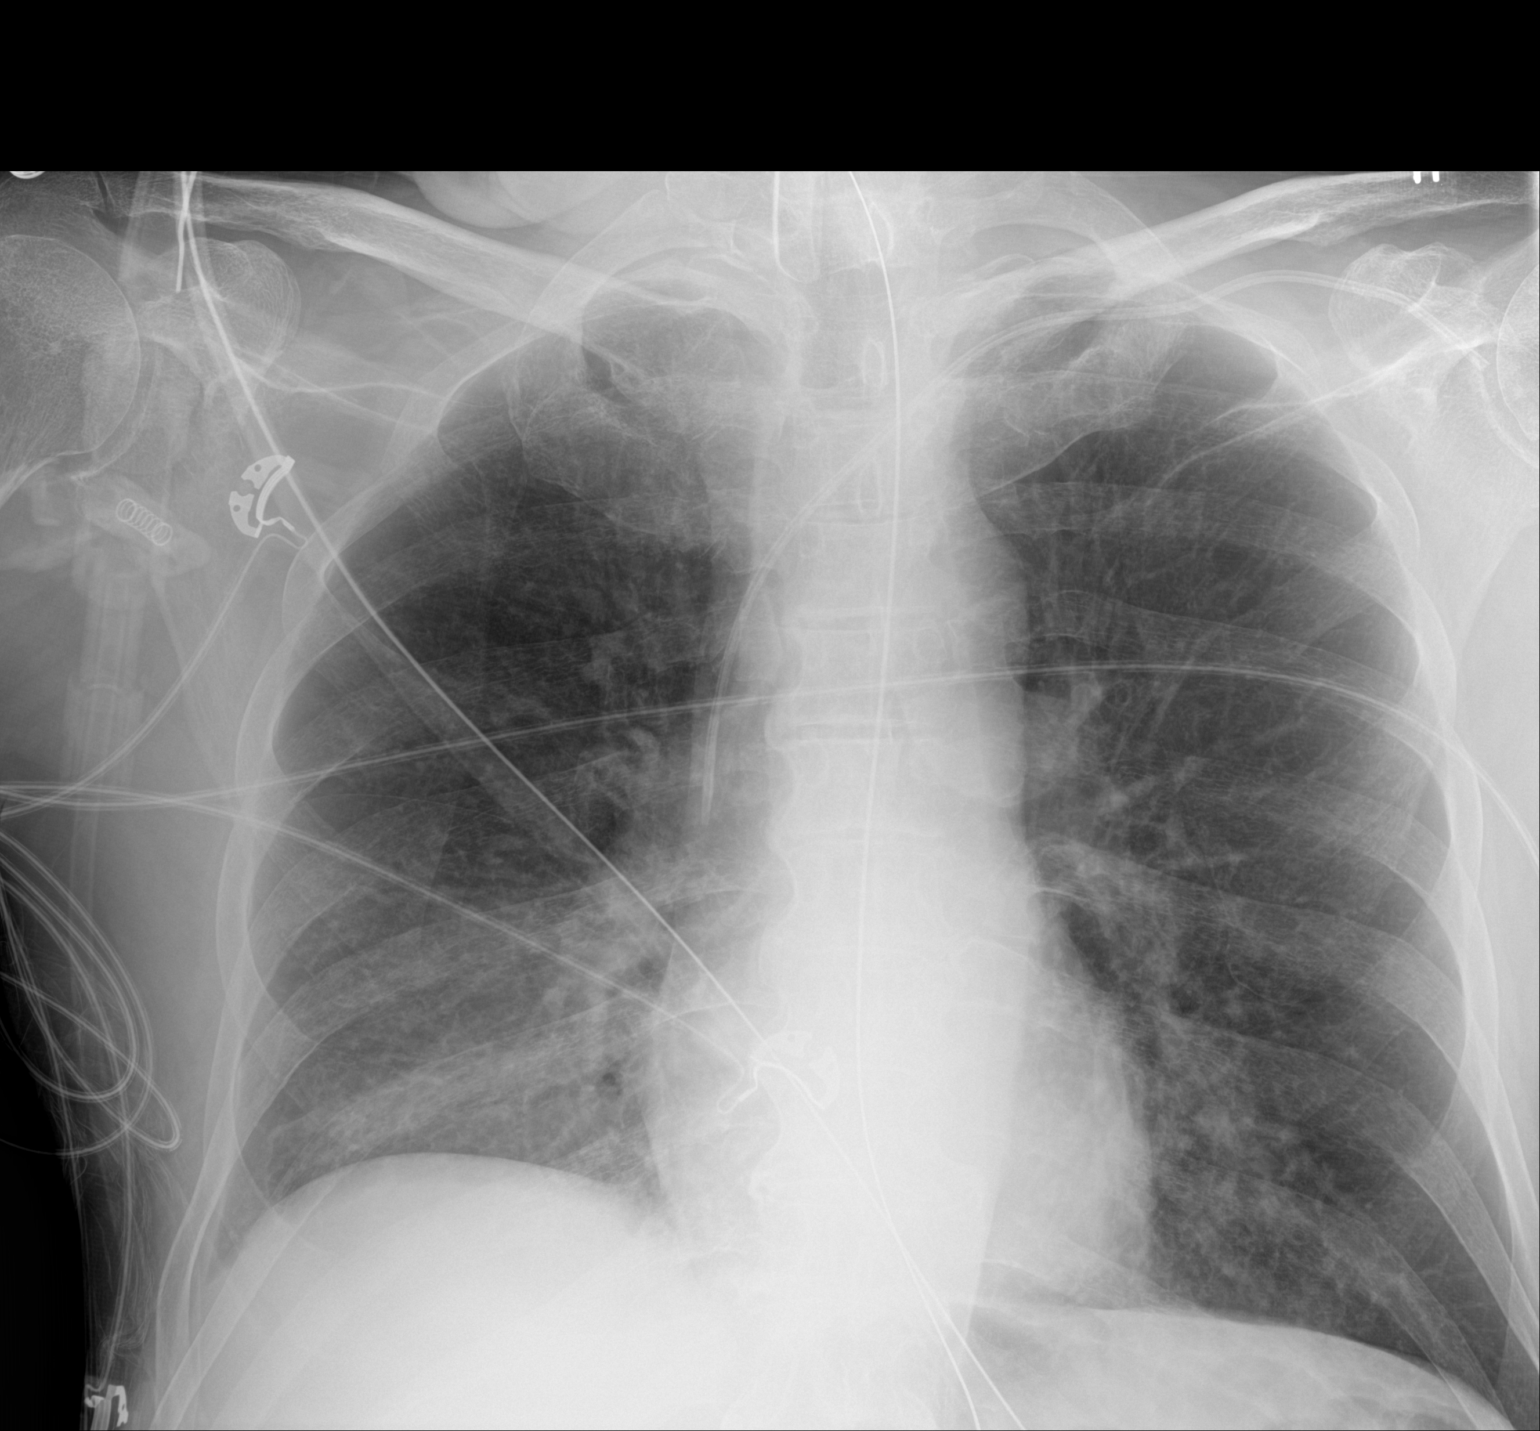

[1 of 1 positions shown; findings below may reference images not displayed]

FINDINGS: Endotracheal tube tip is 9.7 cm above the carina. Central catheter
tip is in the superior cava. Nasogastric tube tip and side port
below the diaphragm. No pneumothorax. There is persistent hazy
opacity in the right base. There is mild patchy opacity in the left
base, slightly less than 1 day prior duct stable compared to earlier
in the day. Heart size and pulmonary vascularity are normal. No
adenopathy.
IMPRESSION: Tube and catheter positions as described without pneumothorax. It
may be prudent to consider advancing endotracheal tube approximately
4 cm given its rather high position at the level of T1. Patchy
infiltrate in both lung bases, more on the right than on the left,
stable on the right and slightly decreased on the left compared to 1
day prior. No new opacity. No change in cardiac silhouette.

These results will be called to the ordering clinician or
representative by the Radiologist Assistant, and communication
documented in the PACS or zVision Dashboard.
